# Patient Record
Sex: Female | Born: 1970 | Race: Black or African American | Hispanic: No | Marital: Single | Smoking: Never smoker
Health system: Southern US, Community
[De-identification: ages and names within clinical notes are randomized; demographics above are authoritative.]

## PROBLEM LIST (undated history)

## (undated) DIAGNOSIS — T7840XA Allergy, unspecified, initial encounter: Secondary | ICD-10-CM

## (undated) DIAGNOSIS — D649 Anemia, unspecified: Secondary | ICD-10-CM

## (undated) DIAGNOSIS — Z889 Allergy status to unspecified drugs, medicaments and biological substances status: Secondary | ICD-10-CM

## (undated) DIAGNOSIS — R079 Chest pain, unspecified: Secondary | ICD-10-CM

## (undated) DIAGNOSIS — N939 Abnormal uterine and vaginal bleeding, unspecified: Secondary | ICD-10-CM

## (undated) DIAGNOSIS — I10 Essential (primary) hypertension: Secondary | ICD-10-CM

## (undated) DIAGNOSIS — R030 Elevated blood-pressure reading, without diagnosis of hypertension: Secondary | ICD-10-CM

## (undated) HISTORY — DX: Abnormal uterine and vaginal bleeding, unspecified: N93.9

## (undated) HISTORY — PX: MULTIPLE TOOTH EXTRACTIONS: SHX2053

## (undated) HISTORY — PX: APPENDECTOMY: SHX54

## (undated) HISTORY — DX: Elevated blood-pressure reading, without diagnosis of hypertension: R03.0

## (undated) HISTORY — DX: Essential (primary) hypertension: I10

## (undated) HISTORY — PX: WISDOM TOOTH EXTRACTION: SHX21

## (undated) HISTORY — DX: Chest pain, unspecified: R07.9

## (undated) HISTORY — DX: Allergy, unspecified, initial encounter: T78.40XA

## (undated) HISTORY — PX: ABDOMINAL HYSTERECTOMY: SHX81

---

## 2000-01-23 ENCOUNTER — Encounter: Admission: RE | Admit: 2000-01-23 | Discharge: 2000-01-23 | Payer: Self-pay | Admitting: Family Medicine

## 2000-01-24 ENCOUNTER — Encounter: Admission: RE | Admit: 2000-01-24 | Discharge: 2000-01-24 | Payer: Self-pay | Admitting: Family Medicine

## 2000-06-19 HISTORY — PX: ORIF RADIUS & ULNA FRACTURES: SHX2129

## 2000-09-25 ENCOUNTER — Emergency Department (HOSPITAL_COMMUNITY): Admission: EM | Admit: 2000-09-25 | Discharge: 2000-09-25 | Payer: Self-pay | Admitting: Emergency Medicine

## 2000-10-01 ENCOUNTER — Encounter: Admission: RE | Admit: 2000-10-01 | Discharge: 2000-10-01 | Payer: Self-pay | Admitting: Sports Medicine

## 2000-10-18 ENCOUNTER — Encounter: Admission: RE | Admit: 2000-10-18 | Discharge: 2000-10-18 | Payer: Self-pay | Admitting: Family Medicine

## 2000-10-26 ENCOUNTER — Ambulatory Visit (HOSPITAL_BASED_OUTPATIENT_CLINIC_OR_DEPARTMENT_OTHER): Admission: RE | Admit: 2000-10-26 | Discharge: 2000-10-27 | Payer: Self-pay | Admitting: Orthopedic Surgery

## 2001-04-08 ENCOUNTER — Encounter: Admission: RE | Admit: 2001-04-08 | Discharge: 2001-04-08 | Payer: Self-pay | Admitting: Family Medicine

## 2001-05-22 ENCOUNTER — Ambulatory Visit (HOSPITAL_BASED_OUTPATIENT_CLINIC_OR_DEPARTMENT_OTHER): Admission: RE | Admit: 2001-05-22 | Discharge: 2001-05-22 | Payer: Self-pay | Admitting: Orthopedic Surgery

## 2001-08-08 ENCOUNTER — Encounter: Admission: RE | Admit: 2001-08-08 | Discharge: 2001-08-08 | Payer: Self-pay | Admitting: Family Medicine

## 2001-09-19 ENCOUNTER — Encounter: Admission: RE | Admit: 2001-09-19 | Discharge: 2001-09-19 | Payer: Self-pay | Admitting: Family Medicine

## 2003-05-20 ENCOUNTER — Encounter (INDEPENDENT_AMBULATORY_CARE_PROVIDER_SITE_OTHER): Payer: Self-pay | Admitting: *Deleted

## 2003-05-20 LAB — CONVERTED CEMR LAB

## 2003-06-08 ENCOUNTER — Encounter: Admission: RE | Admit: 2003-06-08 | Discharge: 2003-06-08 | Payer: Self-pay | Admitting: Family Medicine

## 2003-06-08 ENCOUNTER — Encounter (INDEPENDENT_AMBULATORY_CARE_PROVIDER_SITE_OTHER): Payer: Self-pay | Admitting: *Deleted

## 2003-06-30 ENCOUNTER — Encounter: Admission: RE | Admit: 2003-06-30 | Discharge: 2003-06-30 | Payer: Self-pay | Admitting: Family Medicine

## 2003-07-02 ENCOUNTER — Encounter: Admission: RE | Admit: 2003-07-02 | Discharge: 2003-07-02 | Payer: Self-pay | Admitting: Sports Medicine

## 2003-07-06 ENCOUNTER — Encounter: Admission: RE | Admit: 2003-07-06 | Discharge: 2003-07-06 | Payer: Self-pay | Admitting: Family Medicine

## 2003-07-14 ENCOUNTER — Encounter: Admission: RE | Admit: 2003-07-14 | Discharge: 2003-07-14 | Payer: Self-pay | Admitting: Obstetrics and Gynecology

## 2003-07-29 ENCOUNTER — Encounter: Admission: RE | Admit: 2003-07-29 | Discharge: 2003-07-29 | Payer: Self-pay | Admitting: Family Medicine

## 2003-09-01 ENCOUNTER — Other Ambulatory Visit: Admission: RE | Admit: 2003-09-01 | Discharge: 2003-09-01 | Payer: Self-pay | Admitting: Obstetrics & Gynecology

## 2003-09-01 ENCOUNTER — Encounter: Admission: RE | Admit: 2003-09-01 | Discharge: 2003-09-01 | Payer: Self-pay | Admitting: Obstetrics and Gynecology

## 2003-09-02 ENCOUNTER — Encounter (INDEPENDENT_AMBULATORY_CARE_PROVIDER_SITE_OTHER): Payer: Self-pay | Admitting: Specialist

## 2003-09-22 ENCOUNTER — Encounter: Admission: RE | Admit: 2003-09-22 | Discharge: 2003-09-22 | Payer: Self-pay | Admitting: Obstetrics and Gynecology

## 2003-12-15 ENCOUNTER — Encounter: Admission: RE | Admit: 2003-12-15 | Discharge: 2003-12-15 | Payer: Self-pay | Admitting: Obstetrics and Gynecology

## 2003-12-15 ENCOUNTER — Encounter (INDEPENDENT_AMBULATORY_CARE_PROVIDER_SITE_OTHER): Payer: Self-pay | Admitting: Specialist

## 2004-07-19 ENCOUNTER — Encounter (INDEPENDENT_AMBULATORY_CARE_PROVIDER_SITE_OTHER): Payer: Self-pay | Admitting: Specialist

## 2004-07-19 ENCOUNTER — Ambulatory Visit: Payer: Self-pay | Admitting: Obstetrics & Gynecology

## 2004-08-31 ENCOUNTER — Ambulatory Visit: Payer: Self-pay | Admitting: Family Medicine

## 2005-01-17 ENCOUNTER — Encounter (INDEPENDENT_AMBULATORY_CARE_PROVIDER_SITE_OTHER): Payer: Self-pay | Admitting: *Deleted

## 2005-01-17 ENCOUNTER — Ambulatory Visit: Payer: Self-pay | Admitting: Obstetrics & Gynecology

## 2005-05-01 ENCOUNTER — Emergency Department (HOSPITAL_COMMUNITY): Admission: EM | Admit: 2005-05-01 | Discharge: 2005-05-01 | Payer: Self-pay | Admitting: Family Medicine

## 2005-07-25 ENCOUNTER — Ambulatory Visit: Payer: Self-pay | Admitting: Family Medicine

## 2006-06-19 HISTORY — PX: TONSILLECTOMY: SUR1361

## 2006-08-16 DIAGNOSIS — E669 Obesity, unspecified: Secondary | ICD-10-CM

## 2006-08-16 DIAGNOSIS — D649 Anemia, unspecified: Secondary | ICD-10-CM

## 2006-08-16 DIAGNOSIS — J309 Allergic rhinitis, unspecified: Secondary | ICD-10-CM

## 2006-08-17 ENCOUNTER — Encounter (INDEPENDENT_AMBULATORY_CARE_PROVIDER_SITE_OTHER): Payer: Self-pay | Admitting: *Deleted

## 2006-08-17 ENCOUNTER — Ambulatory Visit: Payer: Self-pay | Admitting: Family Medicine

## 2006-08-17 ENCOUNTER — Encounter (INDEPENDENT_AMBULATORY_CARE_PROVIDER_SITE_OTHER): Payer: Self-pay | Admitting: Family Medicine

## 2006-08-17 LAB — CONVERTED CEMR LAB
ALT: 14 units/L (ref 0–35)
AST: 21 units/L (ref 0–37)
Albumin: 4.1 g/dL (ref 3.5–5.2)
CO2: 27 meq/L (ref 19–32)
Calcium: 9.2 mg/dL (ref 8.4–10.5)
Chlamydia, DNA Probe: NEGATIVE
Chloride: 102 meq/L (ref 96–112)
Cholesterol: 173 mg/dL (ref 0–200)
Creatinine, Ser: 0.68 mg/dL (ref 0.40–1.20)
GC Probe Amp, Genital: NEGATIVE
Potassium: 4 meq/L (ref 3.5–5.3)
Total CHOL/HDL Ratio: 3.5

## 2006-08-20 ENCOUNTER — Encounter (INDEPENDENT_AMBULATORY_CARE_PROVIDER_SITE_OTHER): Payer: Self-pay | Admitting: Family Medicine

## 2006-08-20 LAB — CONVERTED CEMR LAB: Pap Smear: NORMAL

## 2006-08-24 ENCOUNTER — Encounter (INDEPENDENT_AMBULATORY_CARE_PROVIDER_SITE_OTHER): Payer: Self-pay | Admitting: Family Medicine

## 2006-08-27 ENCOUNTER — Telehealth (INDEPENDENT_AMBULATORY_CARE_PROVIDER_SITE_OTHER): Payer: Self-pay | Admitting: *Deleted

## 2006-10-26 ENCOUNTER — Encounter (INDEPENDENT_AMBULATORY_CARE_PROVIDER_SITE_OTHER): Payer: Self-pay | Admitting: Specialist

## 2006-10-26 ENCOUNTER — Ambulatory Visit (HOSPITAL_COMMUNITY): Admission: RE | Admit: 2006-10-26 | Discharge: 2006-10-26 | Payer: Self-pay | Admitting: Otolaryngology

## 2007-05-29 ENCOUNTER — Ambulatory Visit: Payer: Self-pay | Admitting: Obstetrics & Gynecology

## 2007-05-29 ENCOUNTER — Ambulatory Visit: Payer: Self-pay | Admitting: Obstetrics and Gynecology

## 2007-08-23 ENCOUNTER — Encounter (INDEPENDENT_AMBULATORY_CARE_PROVIDER_SITE_OTHER): Payer: Self-pay | Admitting: Gynecology

## 2007-08-23 ENCOUNTER — Ambulatory Visit: Payer: Self-pay | Admitting: Gynecology

## 2007-08-30 ENCOUNTER — Ambulatory Visit: Payer: Self-pay | Admitting: Obstetrics & Gynecology

## 2007-11-18 ENCOUNTER — Emergency Department (HOSPITAL_COMMUNITY): Admission: EM | Admit: 2007-11-18 | Discharge: 2007-11-18 | Payer: Self-pay | Admitting: Emergency Medicine

## 2007-11-19 ENCOUNTER — Ambulatory Visit: Payer: Self-pay | Admitting: Sports Medicine

## 2007-11-19 DIAGNOSIS — Q6650 Congenital pes planus, unspecified foot: Secondary | ICD-10-CM | POA: Insufficient documentation

## 2007-11-19 DIAGNOSIS — M76829 Posterior tibial tendinitis, unspecified leg: Secondary | ICD-10-CM | POA: Insufficient documentation

## 2009-06-19 HISTORY — PX: CHOLECYSTECTOMY: SHX55

## 2009-08-05 ENCOUNTER — Ambulatory Visit: Payer: Self-pay | Admitting: Family Medicine

## 2009-08-05 DIAGNOSIS — M549 Dorsalgia, unspecified: Secondary | ICD-10-CM

## 2009-09-08 ENCOUNTER — Ambulatory Visit: Payer: Self-pay | Admitting: Family Medicine

## 2009-09-08 ENCOUNTER — Encounter: Payer: Self-pay | Admitting: Family Medicine

## 2009-09-08 DIAGNOSIS — R1012 Left upper quadrant pain: Secondary | ICD-10-CM

## 2009-09-09 ENCOUNTER — Encounter: Payer: Self-pay | Admitting: Family Medicine

## 2009-09-09 LAB — CONVERTED CEMR LAB
ALT: 11 units/L (ref 0–35)
Albumin: 4.1 g/dL (ref 3.5–5.2)
CO2: 25 meq/L (ref 19–32)
Calcium: 9.2 mg/dL (ref 8.4–10.5)
Chloride: 104 meq/L (ref 96–112)
Cholesterol: 173 mg/dL (ref 0–200)
Glucose, Bld: 106 mg/dL — ABNORMAL HIGH (ref 70–99)
Platelets: 415 10*3/uL — ABNORMAL HIGH (ref 150–400)
Potassium: 4.3 meq/L (ref 3.5–5.3)
RBC: 4.31 M/uL (ref 3.87–5.11)
Sodium: 139 meq/L (ref 135–145)
Total Bilirubin: 0.4 mg/dL (ref 0.3–1.2)
Total Protein: 7 g/dL (ref 6.0–8.3)
Triglycerides: 97 mg/dL (ref <150)
VLDL: 19 mg/dL (ref 0–40)
WBC: 4.9 10*3/uL (ref 4.0–10.5)

## 2009-09-10 LAB — CONVERTED CEMR LAB
Iron: 21 ug/dL — ABNORMAL LOW (ref 42–145)
Pap Smear: NEGATIVE
Pap Smear: NORMAL
Saturation Ratios: 5 % — ABNORMAL LOW (ref 20–55)
TIBC: 417 ug/dL (ref 250–470)

## 2009-09-30 ENCOUNTER — Ambulatory Visit: Payer: Self-pay | Admitting: Family Medicine

## 2009-09-30 ENCOUNTER — Telehealth: Payer: Self-pay | Admitting: Family Medicine

## 2009-10-20 ENCOUNTER — Encounter: Payer: Self-pay | Admitting: Family Medicine

## 2009-10-20 ENCOUNTER — Ambulatory Visit: Payer: Self-pay | Admitting: Family Medicine

## 2009-10-20 DIAGNOSIS — R7309 Other abnormal glucose: Secondary | ICD-10-CM | POA: Insufficient documentation

## 2009-10-21 LAB — CONVERTED CEMR LAB
HCT: 31.8 % — ABNORMAL LOW (ref 36.0–46.0)
MCHC: 30.5 g/dL (ref 30.0–36.0)
MCV: 69.1 fL — ABNORMAL LOW (ref 78.0–100.0)
Platelets: 290 10*3/uL (ref 150–400)
WBC: 6.7 10*3/uL (ref 4.0–10.5)

## 2009-12-13 ENCOUNTER — Emergency Department (HOSPITAL_COMMUNITY): Admission: EM | Admit: 2009-12-13 | Discharge: 2009-12-13 | Payer: Self-pay | Admitting: Emergency Medicine

## 2009-12-13 ENCOUNTER — Telehealth: Payer: Self-pay | Admitting: Family Medicine

## 2009-12-16 ENCOUNTER — Ambulatory Visit: Payer: Self-pay | Admitting: Family Medicine

## 2009-12-21 ENCOUNTER — Ambulatory Visit: Payer: Self-pay | Admitting: Family Medicine

## 2009-12-21 ENCOUNTER — Telehealth: Payer: Self-pay | Admitting: Family Medicine

## 2009-12-22 ENCOUNTER — Ambulatory Visit: Payer: Self-pay | Admitting: Family Medicine

## 2009-12-22 ENCOUNTER — Encounter: Payer: Self-pay | Admitting: Family Medicine

## 2009-12-22 LAB — CONVERTED CEMR LAB
Alkaline Phosphatase: 60 units/L (ref 39–117)
BUN: 6 mg/dL (ref 6–23)
Blood in Urine, dipstick: NEGATIVE
Creatinine, Ser: 0.72 mg/dL (ref 0.40–1.20)
Glucose, Bld: 139 mg/dL — ABNORMAL HIGH (ref 70–99)
Hemoglobin: 9.3 g/dL — ABNORMAL LOW (ref 12.0–15.0)
Ketones, urine, test strip: NEGATIVE
MCHC: 31 g/dL (ref 30.0–36.0)
MCV: 70.6 fL — ABNORMAL LOW (ref 78.0–100.0)
Nitrite: NEGATIVE
RBC: 4.25 M/uL (ref 3.87–5.11)
RDW: 15.9 % — ABNORMAL HIGH (ref 11.5–15.5)
Total Bilirubin: 0.4 mg/dL (ref 0.3–1.2)
Urobilinogen, UA: 0.2
WBC Urine, dipstick: NEGATIVE

## 2009-12-27 ENCOUNTER — Encounter: Payer: Self-pay | Admitting: Family Medicine

## 2009-12-27 ENCOUNTER — Ambulatory Visit: Payer: Self-pay | Admitting: Family Medicine

## 2009-12-27 LAB — CONVERTED CEMR LAB
Hgb A2 Quant: 4.6 % — ABNORMAL HIGH (ref 2.2–3.2)
Hgb F Quant: 0 % (ref 0.0–2.0)
Hgb S Quant: 0 % (ref 0.0–0.0)

## 2009-12-28 ENCOUNTER — Encounter: Admission: RE | Admit: 2009-12-28 | Discharge: 2009-12-28 | Payer: Self-pay | Admitting: Family Medicine

## 2009-12-28 ENCOUNTER — Telehealth: Payer: Self-pay | Admitting: Family Medicine

## 2009-12-28 DIAGNOSIS — K8020 Calculus of gallbladder without cholecystitis without obstruction: Secondary | ICD-10-CM

## 2009-12-30 ENCOUNTER — Ambulatory Visit: Payer: Self-pay | Admitting: Family Medicine

## 2009-12-30 ENCOUNTER — Telehealth: Payer: Self-pay | Admitting: *Deleted

## 2010-01-19 ENCOUNTER — Encounter: Payer: Self-pay | Admitting: Family Medicine

## 2010-02-22 ENCOUNTER — Encounter (INDEPENDENT_AMBULATORY_CARE_PROVIDER_SITE_OTHER): Payer: Self-pay | Admitting: Surgery

## 2010-02-22 ENCOUNTER — Ambulatory Visit (HOSPITAL_COMMUNITY): Admission: RE | Admit: 2010-02-22 | Discharge: 2010-02-23 | Payer: Self-pay | Admitting: Surgery

## 2010-03-02 ENCOUNTER — Ambulatory Visit: Payer: Self-pay | Admitting: Family Medicine

## 2010-03-09 ENCOUNTER — Emergency Department (HOSPITAL_COMMUNITY): Admission: EM | Admit: 2010-03-09 | Discharge: 2010-03-09 | Payer: Self-pay | Admitting: Family Medicine

## 2010-03-16 ENCOUNTER — Encounter: Payer: Self-pay | Admitting: Family Medicine

## 2010-07-21 NOTE — Assessment & Plan Note (Signed)
Summary: gallstones/[place]   Vital Signs:  Patient profile:   40 year old female Height:      64 inches Weight:      251 pounds BMI:     43.24 BSA:     2.16 Temp:     98.2 degrees F Pulse rate:   69 / minute BP sitting:   133 / 83  Vitals Entered By: Jone Baseman CMA (December 30, 2009 8:41 AM) CC: gallstone Is Patient Diabetic? No Pain Assessment Patient in pain? yes     Location: back Intensity: 2   Primary Care Provider:  Ancil Boozer  MD  CC:  gallstone.  History of Present Illness: gallstones: here to discuss results of ultrasound and plan from here.  still having intermittent pain that is primarily in LUQ.  pain seems to be worst at night.  she's had no fevers.  she's interested in learning options for her at this point for her gallstones.  she reports for pain she has been watching her diet and also using as needed motrin/ibuprofen. she did try one of her mother's hydrocodone/apap which helped more last night.   Habits & Providers  Alcohol-Tobacco-Diet     Tobacco Status: never  Current Medications (verified): 1)  Zofran Odt 4 Mg Tbdp (Ondansetron) .... Take 1 Pill By Mouth Every 8 Hours As Needed For Nausea 2)  Prochlorperazine Maleate 10 Mg Tabs (Prochlorperazine Maleate) .... Take 1 Pill By Mouth Q8hours Prn Nausea 3)  Hydrocodone-Acetaminophen 5-500 Mg Tabs (Hydrocodone-Acetaminophen) .Marland Kitchen.. 1 By Mouth Q6hr As Needed Pain  Allergies (verified): 1)  ! Sulfa  Past History:  Past medical, surgical, family and social histories (including risk factors) reviewed for relevance to current acute and chronic problems.  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 L arm surgery 2/2 break, subsequently had plate removed s/p T&A 2008  Family History: Reviewed history from 10/20/2009 and no changes required. Mother- fibroids w/hysterectomy 30s, prediabetes, ?HTN, thalassemia, ? alopecia Father - HTN, h/o gall bladder problems, diabetes Brother - healthy Daughter - healthy  Social History: Reviewed history from 09/08/2009 and no changes required. Works at [employer] engineer. Lives w/ daughter Yassmine Tamm). In monogamous relationship w/BF.  No tob/ETOH/drugs  Review of Systems       per HPI  Physical Exam  General:  vs reviewed, alert, well-developed, well-nourished, and well-hydrated.   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   she is advised to avoid large and/or fatty meals while on her trip.   she does report she has a physician going with her.  she is advised so seek treatment while away if she develops fevers.   she has been given a script for vicodin for pain while she is away knowing this is only to hold her over until surgery.  surgical referral attached to phone note from this AM Orders: [facility]- Est Level  3 (65784)  Complete Medication List: 1)  Zofran Odt 4 Mg Tbdp (Ondansetron) .... Take 1 pill by mouth every 8 hours as needed for nausea 2)  Prochlorperazine Maleate 10 Mg Tabs (Prochlorperazine maleate) .... Take 1 pill by mouth q8hours prn nausea 3)  Hydrocodone-acetaminophen 5-500 Mg Tabs (Hydrocodone-acetaminophen) .Marland Kitchen.. 1 by mouth  q6hr as needed pain  Patient Instructions: 1)  We will schedule a surgery appt hopefully the week you get back to discuss surgery for your gall bladder.   2)  Avoid large meals and fatty foods.  3)  If you get a fever while on your trip you should be seen. Prescriptions: HYDROCODONE-ACETAMINOPHEN 5-500 MG TABS (HYDROCODONE-ACETAMINOPHEN) 1 by mouth q6hr as needed pain  #45 x 0   Entered and Authorized by:   Ancil Boozer  MD   Signed by:   Ancil Boozer  MD on 12/30/2009   Method used:   Handwritten   RxID:   [id number]

## 2010-07-21 NOTE — Assessment & Plan Note (Signed)
Summary: r ankle swollen/[place]   Vital Signs:  Patient profile:   40 year old female Weight:      255.8 pounds Temp:     98.4 degrees F oral Pulse rate:   82 / minute Pulse rhythm:   regular BP sitting:   117 / 84  (left arm) Cuff size:   regular  Vitals Entered By: Loralee Pacas CMA (September 30, 2009 9:46 AM) CC: right ankle and right wrist Comments right side tingling for an hour   Primary Care Provider:  Ancil Boozer  MD  CC:  right ankle and right wrist.  History of Present Illness: 1.  right ankle--woke up this morning and noticed that her ankle was slightly swollen.  not red or painful.  no injury or twisting or overuse.  a short time later, her toes started to tingle.  this radiated up into her calf.  however this sensation has since subsided.  does not feel that ankle is unstable.  no popping.    2.  right wrist--for the past week, wrist slightly tingly/numb when she wakes up in the morning.  goes away later in the day.  works as a Teaching laboratory technician.  does not do a lot of typing or other repetitive activity with her hands.  tingling radiates about 2/3 up her forearm.    Allergies: 1)  ! Sulfa  Review of Systems General:  Denies fever and weakness. MS:  no muscles weakness or loss of rom. Neuro:  Complains of tingling; denies brief paralysis, difficulty with concentration, disturbances in coordination, headaches, inability to speak, memory loss, poor balance, visual disturbances, and weakness.  Physical Exam  General:  Well-developed,well-nourished,in no acute distress; alert,appropriate and cooperative throughout examination.  VS noted.  obese Msk:  right ankle:  I cannot appreciate any swelling.  no erythema or skin changes.  5/5 strength.  normal ROM, no joint warmth, no joint deformities, and no joint instability.  she does have mild ttp over anterior tendons.    right wrist:  no swelling or erythema or skin changes.  no joint deformity.  full rom.  5/5 strength.    +phalens and tinels after about 30 sec Pulses:  1+ dp pulses 2+ radial pulses Neurologic:  alert & oriented X3 and major cranial nerves II-XII intact.  alert & oriented X3, cranial nerves II-XII intact, strength normal in all extremities, sensation intact to light touch, gait normal, finger-to-nose normal, and Romberg negative.     Impression & Recommendations:  Problem # 1:  ? of ANKLE SPRAIN, RIGHT (ICD-845.00) Assessment New  very mild ankle sprain vs tendinits.  Recommend rice.  see pt instruction.  exam quite unimpressive  Orders: [facility]- Est  Level 4 (16109)  Problem # 2:  WRIST PAIN, RIGHT (UEA-540.98) Assessment: New  think probably carpal tunnel.  gave cock-up wrist splint.  only other concern is could this be early presentation of something more ominous, such as MS.  this seems less likely.  but will have her f/u in 3 weeks to ensure she is getting better.    Orders: Wrist Splint Cock Up [phone]) Brace Wrist [phone]) EMR Misc Charge Code (EMRMisc) [facility]- Est  Level 4 (62130)  Patient Instructions: 1)  It was nice to see you today. 2)  I think you may have carpal tunnel syndrome in your right wrist.  Wear the wrist splint that we gave you at night and any time you are using your hands a lot. 3)  For your ankle, you  may have a mild sprain.  Try resting and elevating your ankle as much as possible.  Try wearing a compression sleeve.  Putting ice on it will help too. 4)  Please schedule a follow-up appointment in 3 weeks with myself or Dr. Sandi Mealy to make sure you are gettting better.  5)  If you have any one-sided weakness, trouble speaking call 911.

## 2010-07-21 NOTE — Assessment & Plan Note (Signed)
Summary: cpp/eo   Vital Signs:  Patient profile:   40 year old female Height:      64 inches Weight:      253.3 pounds BMI:     43.64 Temp:     98.2 degrees F oral Pulse rate:   86 / minute BP sitting:   135 / 81  (left arm) Cuff size:   regular  Vitals Entered By: Garen Grams LPN (September 08, 2009 8:55 AM) CC: CPE Is Patient Diabetic? No Pain Assessment Patient in pain? yes     Location: lower back   Primary Care Provider:  Ancil Boozer  MD  CC:  CPE.  History of Present Illness: here for CPE/pap.  wants to also discuss recent back/abdominal pain that she saw Dr Wallene Huh for.  At his recommendation she has been keeping a food diary to see if she can correlate her LUQ abdominal and back pain to her diet.  She never tried the beano as suggested but did try one called "digest more."  this did help her symptoms.  She has also noticed a correlation of worsening with eating too late in the evening, eating dairy or eating "heavy foods."  when avoiding this she does not have the discomfort.  She has also tried to get back into exercising she she thinks may be helping.  She has noticed some continued bloating and pain if she eats these foods.  denies any changes in bowel habits, no vomiting or nausea, no blood in stool.  she denies fevers.    Habits & Providers  Alcohol-Tobacco-Diet     Alcohol drinks/day: 0     Tobacco Status: never  Exercise-Depression-Behavior     Does Patient Exercise: yes     Exercise Counseling: to keep it up     STD Risk: never     Drug Use: never  Current Medications (verified): 1)  None  Allergies (verified): 1)  ! Sulfa  Past History:  Past medical, surgical, family and social histories (including risk factors) reviewed, and no changes noted (except as noted below).  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 Mother- fibroids w/hysterectomy 30s, prediabetes, ?HTN, thalassemia, ? alopecia Father - HTN, h/o gall bladder problems, prediabetes Brother - healthy Daughter - healthy  Social History: Reviewed history from 08/16/2006 and no changes required. Works at [employer] engineer. Lives w/ daughter Tezra Mahr). In monogamous relationship w/BF.  No tob/ETOH/drugsDrug Use:  never STD Risk:  never Does Patient Exercise:  yes  Review of Systems       No headache,  no sore throat, cough, shortness of breath, chest pain, abdominal pain, change in bowel habits, diarrhea, constipation, melena, BRBPR, dysuria, urinary frequency, joint aches or pains, rash.   + for nasal congestion since start of spring.  otherwise per HPI   Physical Exam  General:  Well-developed,well-nourished,in no acute distress; alert,appropriate and cooperative throughout examination.  VS noted.  obese Head:  Normocephalic and atraumatic without obvious abnormalities. No apparent alopecia or balding. Eyes:  No corneal or conjunctival inflammation noted. EOMI. Perrla.  Ears:  External ear exam shows no significant lesions or deformities.  Otoscopic examination reveals clear canals, tympanic membranes are intact bilaterally without bulging, retraction, inflammation or discharge. Hearing is grossly normal bilaterally. Nose:  External nasal examination shows no deformity or inflammation. Nasal mucosa are pink and moist without lesions or exudates. Mouth:  Oral mucosa and oropharynx without lesions or exudates. poor dentition with multiple missing teeth and many others with caries or previous dental work Neck:  No deformities,  masses, or tenderness noted. Breasts:  No mass, nodules, thickening, tenderness, bulging, retraction, inflamation, nipple discharge or skin changes noted.  large. Lungs:  Normal respiratory effort, chest expands symmetrically. Lungs are clear to auscultation, no crackles or wheezes. Heart:  Normal rate and regular rhythm. S1 and S2 normal without gallop, murmur, click, rub or other extra sounds. Abdomen:  Bowel sounds positive,abdomen soft and non-tender without masses, organomegaly or hernias noted. Genitalia:  Normal introitus for age, no external lesions, no vaginal discharge, mucosa pink and moist, no vaginal or cervical lesions, no vaginal atrophy, no friaility or hemorrhage, normal uterus size and position, no adnexal masses or tenderness.  some small subcutanous cysts noted on labia bilaterally.   pap performed Msk:  No deformity or scoliosis noted of thoracic or lumbar spine.   Pulses:  2+in all extremities Extremities:  No clubbing, cyanosis, edema, or deformity noted with normal full range of motion of all joints.   Neurologic:  alert & oriented X3 and gait normal.   Skin:  Intact without suspicious lesions or rashes Psych:  Cognition and judgment appear intact. Alert and cooperative with normal attention span and concentration. No apparent delusions, illusions, hallucinations   Impression & Recommendations:  Problem # 1:  ROUTINE GYNECOLOGICAL EXAMINATION (ICD-V72.31) Assessment Unchanged overall doing well.  encouraged continued exercise, watching diet. due for FLP drawn today.  BP is borderline so will need watching over time. pap performed.  next pap if normal due in 1 year.  start mammos at age 62 per screening guidelines.  Orders: [facility] - Est  18-39 yrs (04540)  Problem # 2:  LUQ PAIN (ICD-789.02) Assessment: Improved see pt instructions.  appears likely related to food intolerances.   Orders: Comp Met-[facility] [phone]) Lipase-[facility] [phone])  Other Orders: Lipid-[facility]  ([id number]) CBC-[facility] (84132) Pap Smear-[facility] ([id number])  Patient Instructions: 1)  I'll send you a letter with the results of your lab work and pap smear unless something unusual comes up. 2)  Try to lactaid or probiotics for your bowels to help get them back in order.  3)  Continue to watch what you eat and to exercise regularly - this is very important for your health and keeping things like high blood pressure and diabetes away. 4)  It was nice to meet you today.  Please follow up if your back pain persists or gets worse.   Appended Document: Orders Update    Clinical Lists Changes  Orders: Added new Test order of Ferritin-[facility] [id number]) - Signed Added new Test order of Iron Binding Cap (TIBC)-[facility] ([id number]) - Signed Added new Test order of Iron -[facility] [phone]) - Signed

## 2010-07-21 NOTE — Progress Notes (Signed)
Summary: triage  Phone Note Call from Patient Call back at [phone]   Caller: Patient Summary of Call: started having GI problems again and needs to talk to nurse Initial call taken by: De Nurse,  December 21, 2009 1:53 PM  Follow-up for Phone Call        states she is not better from last visit. will see pcp at 4:15 today Follow-up by: Golden Circle RN,  December 21, 2009 1:54 PM

## 2010-07-21 NOTE — Progress Notes (Signed)
Summary: Results  Phone Note Call from Patient Call back at [phone]   Summary of Call: pt would like MD to call her cell phone when ultrasound results come in Initial call taken by: Knox Royalty,  December 28, 2009 2:22 PM  Follow-up for Phone Call        pt calling again, said she was told if anything changed, she is in a lot of pain. Follow-up by: Knox Royalty,  December 28, 2009 3:28 PM  Additional Follow-up for Phone Call Additional follow up Details #1::        told her it did show gallstones. she wants to know what the next step is. told her I will send msg to her pcp & the md who saw her & I will call her as soon as I know what next Additional Follow-up by: Golden Circle RN,  December 28, 2009 3:38 PM  New Problems: CHOLELITHIASIS, SYMPTOMATIC (ICD-574.20)   Additional Follow-up for Phone Call Additional follow up Details #2::    pt calling back, wants to know if she can eat or drink anything? Follow-up by: Knox Royalty,  December 29, 2009 11:51 AM  Additional Follow-up for Phone Call Additional follow up Details #3:: Details for Additional Follow-up Action Taken: told her ok to eat & drink. avoid anything fatty.  gave examples. appt made for next day to discuss next steps with md Additional Follow-up by: Golden Circle RN,  December 29, 2009 11:55 AM  New Problems: CHOLELITHIASIS, SYMPTOMATIC (ICD-574.20)  agree with avoiding anything fatty at this time.  we need to set up surgical consult.  stones cannot be "blasted out" as with kidney stones as they #1) will not pass when blasted out and tend to have more problems unlike kidney stones and #2) the gall bladder can make more stones in the future.  So, she needs a surgical consult to talk about removal of the gall bladder.  will put in consult note and order.  please inform patient. Ancil Boozer  MD  December 30, 2009 8:31 AM  patient came in for appt this am. Ancil Boozer  MD  December 30, 2009 8:53 AM

## 2010-07-21 NOTE — Assessment & Plan Note (Signed)
Summary: GI upset/   Vital Signs:  Patient profile:   40 year old female Height:      64 inches Weight:      252 pounds BMI:     43.41 Temp:     98.4 degrees F oral Pulse rate:   93 / minute BP sitting:   128 / 80  (left arm) Cuff size:   regular  Vitals Entered By: Jone Baseman CMA (December 21, 2009 4:49 PM) CC: still having stomach issues Is Patient Diabetic? No Pain Assessment Patient in pain? yes     Location: stomach   Primary Care Provider:  Ancil Boozer  MD  CC:  still having stomach issues.  History of Present Illness: 40 yo female here for f/u GI issues. 1. Has had GI issues off and on since Feb 2011.  Had been improving up until last visit 6/30. Has been taking Omeprazole daily since last visit. Complains of nausea and abd pain.  "Loose" stools for the past 5 days, but not watery; BM's 3-4 times daily.   Decreased appetite in the past 7 days along with decreased by mouth intake. Is tolerating fluids. No fevers, no dysuria, no chills, no sweats, no vomitting.  No blood in stool or urine (started period today) Pain located in LUQ and is associated with vauge back discomfort. Pain is not consistent nor reproducable; comes and goes throut the day, described as "achey and dull".  Not associated with by mouth intake.  Not relieved by anything.    Menses started today.   Habits & Providers  Alcohol-Tobacco-Diet     Tobacco Status: never  Current Problems (verified): 1)  Other Abnormal Glucose  (ICD-790.29) 2)  Luq Pain  (ICD-789.02) 3)  Back Pain  (ICD-724.5) 4)  Tibialis Tendinitis  (ICD-726.72) 5)  Congenital Pes Planus  (ICD-754.61) 6)  Rhinitis, Allergic  (ICD-477.9) 7)  Obesity, Nos  (ICD-278.00) 8)  Anemia, Other, Unspecified  (ICD-285.9)  Current Medications (verified): 1)  Iron 325 (65 Fe) Mg Tabs (Ferrous Sulfate) .... Otc Tab Once Daily 2)  Omeprazole 40 Mg Cpdr (Omeprazole) .Marland Kitchen.. 1 By Mouth Two Times A Day For Stomach Problems 3)  Zofran Odt 4  Mg Tbdp (Ondansetron) .... Take 1 Pill By Mouth Every 8 Hours As Needed For Nausea 4)  Prochlorperazine Maleate 10 Mg Tabs (Prochlorperazine Maleate) .... Take 1 Pill By Mouth Q8hours Prn Nausea  Allergies (verified): 1)  ! 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 alopecia Father - HTN, h/o gall bladder problems, diabetes Brother - healthy Daughter - healthy  Social History: Last updated: 09/08/2009 Works at [employer], advertising. Lives w/ daughter Insiya Oshea). In monogamous relationship w/BF.  No tob/ETOH/drugs  Risk Factors: Alcohol Use: 0 (09/08/2009) Exercise: yes (09/08/2009)  Risk Factors: Smoking Status: never (12/21/2009)  Review of Systems       see hpi  Physical Exam  General:  vs reviewed, alert, well-developed, well-nourished, and well-hydrated.   Lungs:  Normal respiratory effort, chest expands symmetrically. Lungs are clear to auscultation, no crackles or wheezes. Heart:  Normal rate and regular rhythm. S1 and S2 normal without gallop, murmur, click, rub or other extra sounds. Abdomen:  soft, non-tender, normal bowel sounds, no distention, no masses, no guarding, no rigidity, no rebound tenderness, no hepatomegaly, and no splenomegaly.   No CVA tenderness Extremities:  No clubbing, cyanosis, edema, or deformity noted with normal full range of motion of all joints.   Skin:  turgor normal, color normal,  and no rashes.     Impression & Recommendations:  Problem # 1:  LUQ PAIN (ICD-789.02) Assessment Unchanged unclear etiology - will get cmp to monitor LE and check kidney function. ? kidney stone? Period started today, so blood in ua unreilable. Will get cathed ua and culture.  Splenomegaly unlikely; exam inconsistent with gallbladder findings.  If labs unremarkable and pain persists, could consider abdominal ultrasound vs CT scan  See pt instructions  Orders: [facility]- Est Level  3 ([id number])Future Orders: CBC-[facility] (16109) ... 12/22/2009 Comp Met-[facility] ([id number]) ... 12/22/2009  Complete Medication List: 1)  Iron 325 (65 Fe) Mg Tabs (Ferrous sulfate) .... Otc tab once daily 2)  Omeprazole 40 Mg Cpdr (Omeprazole) .Marland Kitchen.. 1 by mouth two times a day for stomach problems 3)  Zofran Odt 4 Mg Tbdp (Ondansetron) .... Take 1 pill by mouth every 8 hours as needed for nausea 4)  Prochlorperazine Maleate 10 Mg Tabs (Prochlorperazine maleate) .... Take 1 pill by mouth q8hours prn nausea  Other Orders: Future Orders: Urine Culture-[facility] ([id number]) ... 12/22/2009 Urinalysis-[facility] (00000) ... 12/22/2009  Patient Instructions: 1)  Please come to the clinic tomorrow and get lab work and a urine culture 2)  I have sent both Zofran and Prochlorperazine to [facility] for your nausea.  Please only take 1 medicine every 8 hours for nausea (do NOT take both at the same time) 3)  Keep your appt with Dr. Sandi Mealy 4)  Make sure your drink plenty of fluids Prescriptions: PROCHLORPERAZINE MALEATE 10 MG TABS (PROCHLORPERAZINE MALEATE) take 1 pill by mouth Q8hours PRn nausea  #30 x 0   Entered and Authorized by:   Alvia Grove DO   Signed by:   Alvia Grove DO on 12/21/2009   Method used:   Electronically to        [id number] (retail)       [address] [place], [place]  [address]       Ph: [phone]       Fax: [phone]   RxID:   [id number] ZOFRAN ODT 4 MG  TBDP (ONDANSETRON) take 1 pill by mouth every 8 hours as needed for nausea  #30 x 1   Entered and Authorized by:   Alvia Grove DO   Signed by:   Alvia Grove DO on 12/21/2009   Method used:   Electronically to        [id number] (retail)       [address] [place], [place]  [address]       Ph: [phone]       Fax: [phone]   RxID:   [id number]

## 2010-07-21 NOTE — Progress Notes (Signed)
Summary: Referral request  Phone Note Call from Patient Call back at Work Phone [phone]   Summary of Call: Pt calling to find out if she can be referred to Dr. Darnell Level for her [facility] bladder removal? Initial call taken by: Knox Royalty,  December 30, 2009 10:45 AM  Follow-up for Phone Call        Appt scheduled.  See order Follow-up by: Jone Baseman CMA,  December 30, 2009 1:54 PM

## 2010-07-21 NOTE — Assessment & Plan Note (Signed)
Summary: f/u/kh   Vital Signs:  Patient profile:   40 year old female Height:      64 inches Weight:      253 pounds BMI:     43.58 BSA:     2.16 Temp:     98.4 degrees F Pulse rate:   74 / minute BP sitting:   115 / 84  Vitals Entered By: Jone Baseman CMA (Oct 20, 2009 9:04 AM) CC: f/u bloodwork, f/u ankle and wrist Is Patient Diabetic? No Pain Assessment Patient in pain? no        Primary Care Provider:  Ancil Boozer  MD  CC:  f/u bloodwork and f/u ankle and wrist.  History of Present Illness: R ankle: improved.  still swells occasionally but no pain.  wearing brace as needed.   R wrist: cock up splint helps when she remembers to wear it.  numbness resolves with use at night.    bloodwork: reviewed with patient today showing profound anemia.  she states this is pretty typical for her.  she has noticed since starting otc iron she has had more energy.  she reconfirms that she has heavy menses that she thinks is contributory. also that her mother has thalassemia.  bloodwork also showed borderline sugar and we discussed this and getting an a1c.  patient reports her father was just dx with dm2.  finally discussed her lipids and that they look very good overall.    Habits & Providers  Alcohol-Tobacco-Diet     Tobacco Status: never  Current Medications (verified): 1)  Iron 325 (65 Fe) Mg Tabs (Ferrous Sulfate) .... Otc Tab Once Daily  Allergies (verified): 1)  ! Sulfa  Past History:  PMH reviewed for relevance  Family History: Mother- fibroids w/hysterectomy 30s, prediabetes, ?HTN, thalassemia, ? alopecia Father - HTN, h/o gall bladder problems, diabetes Brother - healthy Daughter - healthy  Review of Systems       per HPI  Physical Exam  General:  Well-developed,well-nourished,in no acute distress; alert,appropriate and cooperative throughout examination.  VS noted.  obese Msk:  right ankle: minimal at most to no swelling appreciated.  no erythema or  skin changes.  5/5 strength.  normal ROM, no joint warmth, no joint deformities, and no joint instability.  she does have mild ttp over anterior tendons.    right wrist:  no swelling or erythema or skin    Impression & Recommendations:  Problem # 1:  WRIST PAIN, RIGHT (ICD-719.43) Assessment Improved  continue splint nightly for now.   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  if not or not as much as expected consider hemaglobin electrophoresis given family hx.  Her updated medication list for this problem includes:    Iron 325 (65 Fe) Mg Tabs (Ferrous sulfate) ..... Otc tab once daily  Orders: CBC-[facility] (09811) Retic-[facility] ([address]1478-29562) TSH-[facility] (13086-57846) [facility]- Est Level  3 (96295)  Problem # 4:  OTHER ABNORMAL GLUCOSE (ICD-790.29) Assessment: New check a1c given fam hx as well  Orders: A1C-[facility] (28413) [facility]- Est Level  3 (24401)  Complete Medication List: 1)  Iron 325 (65 Fe) Mg Tabs (Ferrous sulfate) .... Otc tab once daily  Patient Instructions: 1)  Lets plan on regrouping in about 3 months to see how  things are going. 2)  If there is anything unusual or worrisome on your bloodwork we'll let you know right away.  3)  It was nice to see you today. - continue to use the wrist and ankle braces.   Laboratory Results   Blood Tests   Date/Time Received: Oct 20, 2009 9:40 AM  Date/Time Reported: Oct 20, 2009 10:25 AM   HGBA1C: 6.0%   (Normal Range: Non-Diabetic - 3-6%   Control Diabetic - 6-8%)  Comments: ...............test performed by......Marland KitchenBonnie A. [place], MLS (ASCP)cm

## 2010-07-21 NOTE — Assessment & Plan Note (Signed)
Summary: back pain,df   Vital Signs:  Patient profile:   40 year old female Weight:      248.4 pounds Temp:     98.7 degrees F oral Pulse rate:   91 / minute BP sitting:   143 / 90  (left arm) Cuff size:   regular  Vitals Entered By: Garen Grams LPN (August 05, 2009 10:14 AM) CC: back discomfort x 3 weeks Is Patient Diabetic? No Pain Assessment Patient in pain? yes     Location: back   CC:  back discomfort x 3 weeks.  History of Present Illness: 1) Back discomfort: x 3 weeks. Intermittent. Mild. Located left side of back "right under rib cage". Feels "dull", like "gas bubble". Worse after drinking sodas, eating fast food. No other triggers.  Not affected by position or movement. Better after drinking waer, burping, passing gas. Not relieved by Tylenol. Has been passing gas more frequently. Denies dysuria, fever, chills, constipation, diarrhea, trauma, vaginal discharge, chest pain, palpitations, dyspnea, numbness, tingling, weakness, radiation of discomfort. LMP was 07/30/09. Occurs more in the morning.   Habits & Providers  Alcohol-Tobacco-Diet     Tobacco Status: never  Current Medications (verified): 1)  None  Allergies (verified): No Known Drug Allergies  Social History: Smoking Status:  never  Physical Exam  General:  obese, NAD  Lungs:  CTAB w/o wheeze or crackles  Heart:  RRR no murmurs  Abdomen:  obese, non tender, +BS, no rebound or guarding  Msk:  non tender to palpation along entire back.  Full ROM at thoracic and lumbar spine w/o pain. -ve SLR bilaterally     Impression & Recommendations:  Problem # 1:  BACK PAIN (ICD-724.5) Assessment New  Mild. Appears to be referred pain from GI process - likely increased gas due to poor eating choices.  Advised to keep track of which foods trigger symptoms. Advised to avoid sodas and fast foods. Advised to eat more fruits and vegetables. Advised to take Beano for increased flatulence. Follow up with PCP.    Orders: [facility]- Est Level  3 (62952)

## 2010-07-21 NOTE — Consult Note (Signed)
Summary: [facility]   Imported By: Clydell Hakim 02/09/2010 13:45:46  _____________________________________________________________________  External Attachment:    Type:   Image     Comment:   External Document

## 2010-07-21 NOTE — Assessment & Plan Note (Signed)
Summary: F/U EO   Vital Signs:  Patient profile:   40 year old female Height:      64 inches Weight:      254.1 pounds BMI:     43.77 Temp:     98.1 degrees F Pulse rate:   78 / minute BP sitting:   133 / 87  (left arm) Cuff size:   regular  Vitals Entered By: Garen Grams LPN (December 27, 2009 9:23 AM) CC: f/u stomach pain Is Patient Diabetic? No Pain Assessment Patient in pain? yes     Location: stomach Intensity: 6   Primary Care Provider:  Ancil Boozer  MD  CC:  f/u stomach pain.  History of Present Illness: stomach pain:  reports nausea has resolved.  only needed one dose of prochlorperazine (this made her feel hungover and "goofy" for an entire day though) but since then doing well in terms of nausea.  pain however still persists.  worse with movement.  she states she feels "vibrations" in the area that hurt.  she also reports that when she eats the pain is worse and occasionally she will note it radiates to the right when eating.  no specific foods have been identified at this time as worse than others. her BMs have been regular - no constipation, no diarrhea, no blood though she states "she can tell she takes iron".  she stopped omeprazole as she saw no improvement.  she is no longer using the digestive enzymes.  she denies fevers.  she tried the [employer] for a few days but actually felt worse.  she hasn't vomited.    Habits & Providers  Alcohol-Tobacco-Diet     Tobacco Status: never  Current Medications (verified): 1)  Zofran Odt 4 Mg Tbdp (Ondansetron) .... Take 1 Pill By Mouth Every 8 Hours As Needed For Nausea 2)  Prochlorperazine Maleate 10 Mg Tabs (Prochlorperazine Maleate) .... Take 1 Pill By Mouth Q8hours Prn Nausea  Allergies (verified): 1)  ! Sulfa  Past History:  Past medical, surgical, family and social histories (including risk factors) reviewed for relevance to current acute and chronic problems.  Past Medical History: Reviewed history from  09/08/2009 and no changes required. 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 L arm surgery 2/2 break, subsequently had plate removed s/p T&A 2008  Family History: Reviewed history from 10/20/2009 and no changes required. Mother- fibroids w/hysterectomy 30s, prediabetes, ?HTN, thalassemia, ? alopecia Father - HTN, h/o gall bladder problems, diabetes Brother - healthy Daughter - healthy  Social History: Reviewed history from 09/08/2009 and no changes required. Works at [employer] engineer. Lives w/ daughter Samanthia Howland). In monogamous relationship w/BF.  No tob/ETOH/drugs  Review of Systems       per HPI  Physical Exam  General:  vs reviewed, alert, well-developed, well-nourished, and well-hydrated.   VS noted = WNL Abdomen:  soft,  normal bowel sounds, no distention, no masses, no guarding, no rigidity, no rebound tenderness, no hepatomegaly, and no splenomegaly.  exam limited due to habitus.  tender over poster ribs on left and under ribs on left around to center but not quite to epigastric region.  mildly tender RUQ as well.  otherwise nontender  No CVA tenderness   Impression &  Recommendations:  Problem # 1:  LUQ PAIN (ICD-789.02) Assessment Deteriorated ? if this could be an abnormal presentation of gallstones - she is at risk = obese, ovulation, age.  will get u/s to eval.  at this point if no cause found on u/s would refer to GI for more workup - ? IBS but would need complete rule out which GI could do.   advised her to take her nausea medicines and food with her to the [place] for her mission trip. she agrees.    Orders: Ultrasound (Ultrasound) [facility]- Est  Level 4 (16109)  Problem # 2:  ANEMIA, OTHER, UNSPECIFIED (ICD-285.9) Assessment: Unchanged given non responsive to iron therapy and given family history of thalassemia will get hemaglobin electrophoresis.  stop iron as this may be causing some GI upset as well  The following medications were removed from the medication list:    Iron 325 (65 Fe) Mg Tabs (Ferrous sulfate) ..... Otc tab once daily  Orders: Miscellaneous Lab Charge-[facility] ([address]) [facility]- Est  Level 4 (09811)  Complete Medication List: 1)  Zofran Odt 4 Mg Tbdp (Ondansetron) .... Take 1 pill by mouth every 8 hours as needed for nausea 2)  Prochlorperazine Maleate 10 Mg Tabs (Prochlorperazine maleate) .... Take 1 pill by mouth q8hours prn nausea  Patient Instructions: 1)  Please follow up after your mission trip to see how the stomach is doing. 2)  I will call you once I have the ultrasound report.   3)  Your hemaglobin electrophoresis will take a few weeks to come back.

## 2010-07-21 NOTE — Progress Notes (Signed)
Summary: triage  Phone Note Call from Patient Call back at [phone]   Summary of Call: Right side feels funny.  Toes tingling, hand feels numb. Initial call taken by: Clydell Hakim,  September 30, 2009 9:18 AM  Follow-up for Phone Call        noticed this am. R ankle is swollen. toes are tingling & R hand is tight up to elbow. advised to come now. she agreed Follow-up by: Golden Circle RN,  September 30, 2009 9:19 AM

## 2010-07-21 NOTE — Consult Note (Signed)
Summary: [facility]   Imported By: De Nurse 04/06/2010 14:54:43  _____________________________________________________________________  External Attachment:    Type:   Image     Comment:   External Document

## 2010-07-21 NOTE — Assessment & Plan Note (Signed)
Summary: F/U GI ISSUES/KH   Vital Signs:  Patient profile:   40 year old female Height:      64 inches Weight:      255.4 pounds BMI:     44.00 Temp:     98.3 degrees F oral Pulse rate:   75 / minute BP sitting:   132 / 85  (left arm) Cuff size:   regular  Vitals Entered By: Garen Grams LPN (December 16, 2009 2:43 PM) CC: f/u stomach pain Is Patient Diabetic? No Pain Assessment Patient in pain? no        Primary Care Provider:  Ancil Boozer  MD  CC:  f/u stomach pain.  History of Present Illness: stomach pain: off and on since 07/2009.  had improved as of recently until probably 1.5 weeks ago when she was on a cruise.  she states she was eating her normal diet and working hard to keep to that on the cruise.  despite this she had some severe nausea and some diarrhea while on the cruise.  in addition she had some left sided abdominal pain radiating around to her back.  she had worked out some so she thought it might be muscle strain but it never improved.  she hasn't tried any meds other than her digestive enzyme which maybe helped a little.  since her cruise she has been seen at urgent care who dx msk pain.  since then it has improved a slight bit by eating much less and bland foods.  the pain remains on her left side.  she's had no fevers with it.  the diarrhea has resolved as of several days ago.  Habits & Providers  Alcohol-Tobacco-Diet     Tobacco Status: never  Current Medications (verified): 1)  Iron 325 (65 Fe) Mg Tabs (Ferrous Sulfate) .... Otc Tab Once Daily 2)  Omeprazole 40 Mg Cpdr (Omeprazole) .Marland Kitchen.. 1 By Mouth Two Times A Day For Stomach Problems  Allergies (verified): 1)  ! Sulfa  Past History:  Past medical, surgical, family and social histories (including risk factors) reviewed for relevance to current acute and chronic problems.  Past Medical History: Reviewed history from 09/08/2009 and no changes required. 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 L arm surgery 2/2 break, subsequently had plate removed s/p T&A 2008  Family History: Reviewed history from 10/20/2009 and no changes required. Mother- fibroids w/hysterectomy 30s, prediabetes, ?HTN, thalassemia, ? alopecia Father - HTN, h/o gall bladder problems, diabetes Brother - healthy Daughter - healthy  Social History: Reviewed history from 09/08/2009 and no changes required. 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 In monogamous relationship w/BF.  No tob/ETOH/drugs  Review of Systems       per HPI  Physical Exam  General:  Well-developed,well-nourished,in no acute distress; alert,appropriate and cooperative throughout examination.  VS noted.  obese Abdomen:  + BS but hypoactive.  soft.  nondistended.  no HSM appreciated but examination limited by habitus.  mildly TTP LUQ and around to her back and ribs.  she has on a very tight fitting bra. no swelling noted.  no rash or skin findings in the area of pain.    Impression & Recommendations:  Problem # 1:  LUQ PAIN (  ICD-789.02) Assessment Deteriorated  still unclear etiology - had improved somewhat with changes in digestive health so i suspect this is likely a GI problem - ? IBS.   could also be MSK pain with ribs too however.  i don't suspect splenomegaly as problem but if continues to persist could consider abdominal ultrasound.   for now, add trial of PPI, f/u before planned mission trip in 3 weeks.  if still present could consider the above or CXR or even GI consult.   Orders: [facility]- Est Level  3 (16109)  Complete Medication List: 1)  Iron 325 (65 Fe) Mg Tabs  (Ferrous sulfate) .... Otc tab once daily 2)  Omeprazole 40 Mg Cpdr (Omeprazole) .Marland Kitchen.. 1 by mouth two times a day for stomach problems  Patient Instructions: 1)  Please follow up just before your trip to [place] to see how your stomach is doing. 2)  Try the medicine for your stomach every day until our follow up. 3)  If things worsen please let me know.  Prescriptions: OMEPRAZOLE 40 MG CPDR (OMEPRAZOLE) 1 by mouth two times a day for stomach problems  #60 x 0   Entered and Authorized by:   Ancil Boozer  MD   Signed by:   Ancil Boozer  MD on 12/16/2009   Method used:   Electronically to        [facility] [id number] (retail)       [address] [place], [place]  [address]       Ph: [phone]       Fax: [phone]   RxID:   [id number]

## 2010-07-21 NOTE — Assessment & Plan Note (Signed)
Summary: follow up gallstones,df   Vital Signs:  Patient profile:   40 year old female Height:      64 inches Weight:      239.2 pounds BMI:     41.21 Temp:     99.3 degrees F oral Pulse rate:   118 / minute BP sitting:   129 / 86  (left arm) Cuff size:   regular  Vitals Entered By: Garen Grams LPN (March 02, 2010 3:22 PM) CC: abdominal bloating Is Patient Diabetic? No Pain Assessment Patient in pain? yes     Location: left side Intensity: 2   Primary Care Provider:  Alvia Grove DO  CC:  abdominal bloating.  History of Present Illness: 1) Follow up of symptomatic cholelithiasis: Laparaoscopic cholecystectomy on 02/22/10 for symptomatic cholelithiasis. Reports some mild upper left quadrant bloating and fullness since the surgery, which is worse with sodas.  Reports nausea with water only (reports that this was a problem prior to surgery as well), though does fine with flavored water, juice, sodas. Last bowel movement was yesterday. Previous bowel movement was 3 days prior. Mild itching noted at surgical sites.    ROS: Denies emesis, diarrhea, melena, hematochezia, abdominal pain, drainage from surgical wounds, fever, chills, dyspnea, chest pain, generalized weakness, decreased appetite.   Habits & Providers  Alcohol-Tobacco-Diet     Alcohol drinks/day: 0     Tobacco Status: never  Current Medications (verified): 1)  Hydrocodone-Acetaminophen 5-500 Mg Tabs (Hydrocodone-Acetaminophen) .Marland Kitchen.. 1 By Mouth Q6hr As Needed Pain  Allergies (verified): 1)  ! Sulfa  Physical Exam  General:  obese, vitals reviewed, NAD, pleasant  Mouth:  moist membranes  Lungs:  Normal respiratory effort, chest expands symmetrically. Lungs are clear to auscultation, no crackles or wheezes. Heart:  Normal rate and regular rhythm. S1 and S2 normal without gallop, murmur, click, rub or other extra sounds. Abdomen:  - soft, non-tender, normal bowel sounds, obese, mildly distended,  no masses,  no guarding, no rigidity, no rebound tenderness, no hepatomegaly, and no splenomegaly.   - surgical sites clean, dry and intact with steri strips, without erythema or drainage   Pulses:  2+ radials  Neurologic:  alert & oriented X3 and cranial nerves II-XII intact grossly.     Impression & Recommendations:  Problem # 1:  CHOLELITHIASIS, SYMPTOMATIC (ICD-574.20) Assessment Improved  s/p lap cholecystectomy. normal exam except for mild bloating. Advised to avoid sodas, since as before, this appears to be the primary trigger for bloating for her. Advised flavored water if unable to tolerate plain water. Advised regarding Miralax for constipation, simethicone for bloating. Patient would likely benefit from further dietary modification given morbid obesity. Will defer this to PCP. Normal exam, no red flags. Follow up with surgery (Dr. Gerrit Friends) on 03/16/10. Will forward these notes to him.   Orders: [facility]- Est Level  3 (01093)  Complete Medication List: 1)  Hydrocodone-acetaminophen 5-500 Mg Tabs (Hydrocodone-acetaminophen) .Marland Kitchen.. 1 by mouth q6hr as needed pain  Patient Instructions: 1)  Add Crystal Light to your water to help flavor. 2)  Take Miralax daily if you notice you are constipated 3)  Take Gas-X daily if you notice that you are feeling bloated 4)  If you have vomiting, abdominal pain, severe diarrhea or other concerns you should follow up with [place] or your surgeons.  Appended Document: follow up gallstones,df faxed

## 2010-07-21 NOTE — Progress Notes (Signed)
Summary: triage  Phone Note Call from Patient Call back at [phone]   Caller: Patient Summary of Call: Pt having stomach issue and wants to see if she can come in today. Initial call taken by: Clydell Hakim,  December 13, 2009 1:52 PM  Follow-up for Phone Call        LUQ pain. pain is now a 6/10 when sitting. 9/10 when she moves.  told her we have no appt left. offered UC or appt tomorrow. she chose to go to UC. states she has been out of town & waited until she was back in [place] to call for an appt. asked her to call back for f/u appt after she is seen at [facility] Follow-up by: Golden Circle RN,  December 13, 2009 2:06 PM

## 2010-07-29 ENCOUNTER — Encounter: Payer: Self-pay | Admitting: *Deleted

## 2010-09-02 LAB — COMPREHENSIVE METABOLIC PANEL
AST: 16 U/L (ref 0–37)
Albumin: 3.9 g/dL (ref 3.5–5.2)
Chloride: 107 mEq/L (ref 96–112)
Creatinine, Ser: 0.7 mg/dL (ref 0.4–1.2)
GFR calc Af Amer: >60 mL/min (ref >60)
Potassium: 4 mEq/L (ref 3.5–5.1)
Sodium: 142 mEq/L (ref 135–145)
Total Bilirubin: 0.6 mg/dL (ref 0.3–1.2)

## 2010-09-02 LAB — CBC
MCH: 22.7 pg — ABNORMAL LOW (ref 26.0–34.0)
Platelets: 326 10*3/uL (ref 150–400)
RBC: 4.12 MIL/uL (ref 3.87–5.11)
WBC: 5.8 10*3/uL (ref 4.0–10.5)

## 2010-09-02 LAB — DIFFERENTIAL
Basophils Relative: 1 % (ref 0–1)
Eosinophils Absolute: 0.1 10*3/uL (ref 0.0–0.7)
Lymphs Abs: 1.8 10*3/uL (ref 0.7–4.0)
Monocytes Relative: 7 % (ref 3–12)

## 2010-09-02 LAB — URINALYSIS, ROUTINE W REFLEX MICROSCOPIC
Glucose, UA: NEGATIVE mg/dL
Leukocytes, UA: NEGATIVE
Protein, ur: NEGATIVE mg/dL
Specific Gravity, Urine: 1.024 (ref 1.005–1.030)
pH: 6 (ref 5.0–8.0)

## 2010-09-02 LAB — URINE MICROSCOPIC-ADD ON

## 2010-09-02 LAB — PREGNANCY, URINE: Preg Test, Ur: NEGATIVE

## 2010-09-04 LAB — POCT URINALYSIS DIP (DEVICE)
Bilirubin Urine: NEGATIVE
Glucose, UA: NEGATIVE mg/dL
Hgb urine dipstick: NEGATIVE
Nitrite: NEGATIVE
Urobilinogen, UA: 0.2 mg/dL (ref 0.0–1.0)

## 2010-10-10 ENCOUNTER — Other Ambulatory Visit (HOSPITAL_COMMUNITY)
Admission: RE | Admit: 2010-10-10 | Discharge: 2010-10-10 | Disposition: A | Discharge status: Home / Self Care | Payer: BC Managed Care – PPO | Source: Ambulatory Visit | Attending: Family Medicine | Admitting: Family Medicine

## 2010-10-10 ENCOUNTER — Encounter: Payer: Self-pay | Admitting: Family Medicine

## 2010-10-10 ENCOUNTER — Ambulatory Visit (INDEPENDENT_AMBULATORY_CARE_PROVIDER_SITE_OTHER): Payer: BC Managed Care – PPO | Admitting: Family Medicine

## 2010-10-10 DIAGNOSIS — Z124 Encounter for screening for malignant neoplasm of cervix: Secondary | ICD-10-CM

## 2010-10-10 DIAGNOSIS — E669 Obesity, unspecified: Secondary | ICD-10-CM

## 2010-10-10 DIAGNOSIS — R7309 Other abnormal glucose: Secondary | ICD-10-CM

## 2010-10-10 DIAGNOSIS — Z01419 Encounter for gynecological examination (general) (routine) without abnormal findings: Secondary | ICD-10-CM | POA: Insufficient documentation

## 2010-10-10 DIAGNOSIS — N760 Acute vaginitis: Secondary | ICD-10-CM

## 2010-10-10 LAB — POCT WET PREP (WET MOUNT)

## 2010-10-10 LAB — BASIC METABOLIC PANEL
BUN: 7 mg/dL (ref 6–23)
CO2: 24 mEq/L (ref 19–32)
Calcium: 9.2 mg/dL (ref 8.4–10.5)
Glucose, Bld: 94 mg/dL (ref 70–99)
Potassium: 3.9 mEq/L (ref 3.5–5.3)
Sodium: 138 mEq/L (ref 135–145)

## 2010-10-10 LAB — LIPID PANEL
Cholesterol: 149 mg/dL (ref 0–200)
HDL: 41 mg/dL (ref >39)

## 2010-10-10 NOTE — Patient Instructions (Signed)
It was nice to meet you today. Your blood pressure today was BP: 132/82 mmHg.  Remember your goal blood pressure is about 120/80.  Please watch how much salt and sodium are in your diet to help your blood pressure.  You have lost about 10 lbs since last year- great job! A healthier weight can help keep your blood pressure and blood sugar normal.  Please continue to make lifestyle changes including eating healthy and exercising regularly.  I am going to check your cholesterol, your blood sugar, and your kidney function today.  I will notify you of these results as well as the pap smear results.

## 2010-10-10 NOTE — Progress Notes (Signed)
Subjective:    Jodi Saunders is a 40 y.o. female who presents for Annual/Subsequent preventive examination.  She has questions about birth control options as she is allergic to latex, but sees her sexual partner infrequently and does not want to take hormonal medications.  She does not think she needs to worry about STD's but would like to be tested.   She also complains about "pimples" on her genital area.  She says she has some on the left side that have been there for a while, but she had one on the right side that got painful and drained.  That one is gone now.    Patient also wonders if she should get a mammogram.  She has no family history of breast cancer but sometimes thinks she feels a lump in her armpit.   Preventive Screening-Counseling & Management  Tobacco History  Smoking status  . Never Smoker   Smokeless tobacco  . Never Used     Current Medications (verified) Current Outpatient Prescriptions  Medication Sig Dispense Refill  . HYDROcodone-acetaminophen (VICODIN) 5-500 MG per tablet Take 1 tablet by mouth every 6 (six) hours as needed.           Allergies (verified) Latex and Sulfonamide derivatives   PAST HISTORY  Family History Family History  Problem Relation Age of Onset  . Diabetes Father   . Diabetes Paternal Grandmother   . Hypertension Paternal Grandmother   . Diabetes Paternal Grandfather   . Hypertension Paternal Grandfather     Social History History  Substance Use Topics  . Smoking status: Never Smoker   . Smokeless tobacco: Never Used  . Alcohol Use: No     Are there smokers in your home (other than you)? No  Risk Factors Current exercise habits: Gym/ health club routine includes cardio. She exercises 3 days a week.  Dietary issues discussed: Patient has made significant dietary changes in recent years- she says she does not eat any fried foods and tries to watch portion control.    obesity (BMI >= 30 kg/m2).   Are you sexually  active?  Yes Do you have more than one partner?  No Patient reports using no contraception or condoms.  She is allergic to latex.    Indicate any recent Medical Services you may have received from other [facility] providers in the past year (date may be approximate). Patient recently saw allergist for testing for peanut allergies.   Immunization History  Administered Date(s) Administered  . Td 05/20/2003    Review of Systems Pertinent items are noted in HPI.    Objective:       Body mass index is 41.62 kg/(m^2). BP 132/82  Pulse 86  Temp(Src) 98.4 F (36.9 C) (Oral)  Ht 5' 4.5" (1.638 m)  Wt 246 lb 4.8 oz (111.721 kg)  BMI 41.62 kg/m2  LMP 09/15/2010  General appearance: alert, cooperative, no distress and morbidly obese Eyes: conjunctivae/corneas clear. PERRL, EOM's intact. Fundi benign. Throat: lips, mucosa, and tongue normal; teeth and gums normal Lungs: clear to auscultation bilaterally Breasts: normal appearance, no masses or tenderness, No nipple retraction or dimpling, No nipple discharge or bleeding Heart: regular rate and rhythm, S1, S2 normal, no murmur, click, rub or gallop Pelvic: cervix normal in appearance, no adnexal masses or tenderness, no cervical motion tenderness, rectovaginal septum normal, uterus normal size, shape, and consistency, vagina normal without discharge and External exam reveals two 5mm cysts, not open or draining on left labia majora.  Extremities:  extremities normal, atraumatic, no cyanosis or edema Lymph nodes: Cervical, supraclavicular, and axillary nodes normal.     Assessment:     40 year old female with obesity     Plan:   Encouraged pt to continue lifestyle changes (diet and exercise).  Pap and GC/Chlamydia/wet prep labs done.  For contraception, pt is not interested in hormonal options, but is open to using non-latex condoms. No indication for early mammogram.

## 2010-10-11 LAB — GC/CHLAMYDIA PROBE AMP, GENITAL
Chlamydia, DNA Probe: NEGATIVE
GC Probe Amp, Genital: NEGATIVE

## 2010-10-11 NOTE — Assessment & Plan Note (Signed)
A1C one year ago in prediabetic range.  Will check fasting blood sugar today.

## 2010-10-11 NOTE — Assessment & Plan Note (Addendum)
Patient has been somewhat successful in the past year with weight loss, encouraged her to continue making lifestyle changes.  Will check fasting lipid profile and BMET today as she is at high risk for HLD and DM.

## 2010-11-01 NOTE — Op Note (Signed)
[name]                ACCOUNT NO.:  [id number]   MEDICAL RECORD NO.:  [id number]          PATIENT TYPE:  AMB   LOCATION:  SDS                          FACILITY:  [facility]   PHYSICIAN:  Hermelinda Medicus, M.D.   DATE OF BIRTH:  Dec 04, 1970   DATE OF PROCEDURE:  10/26/2006  DATE OF DISCHARGE:                               OPERATIVE REPORT   PREOPERATIVE DIAGNOSIS:  Tonsillitis.   POSTOPERATIVE DIAGNOSIS:  Tonsillitis.   OPERATION:  Tonsillectomy.   ANESTHESIA:  General endotracheal with Dr. Michelle Piper.   SURGEON:  Hermelinda Medicus, M.D.   The patient aware of risks and gains, a risk of bleeding.  She is on a  soft bland diet and cannot travel for approximately 10 days and we  discussed the risk of anesthesia and the surgery.   PROCEDURE:  The placed in the supine position under general orotracheal  anesthesia.  The patient was prepped and draped in the appropriate  manner.  The usual head drape was used and the [place] mouth gag was used  and the tonsils were exposed.  There was considerable exudate within the  tonsils, grasped with a tenaculum, removed using Bovie coagulation and  blunt dissection.  All hemostasis was established with Bovie  electrocoagulation.  The right side was considerably more difficult than  the left.  It was full of old purulent cottage cheesy-like material and  some active purulence.  The patient will be on antibiotics.  Once the  tonsils were removed and the stomach was suctioned, nasopharynx was  checked and found to be clear and the tonsillar beds were carefully  cleared of any blood.  The blood loss was approximately 20 mL and the  patient tolerated the procedure very well.  She will follow-up in five  days, 10 days and three weeks.           ______________________________  Hermelinda Medicus, M.D.     JC/MEDQ  D:  10/26/2006  T:  10/26/2006  Job:  [id number]   cc:   Tawnya Crook Erenest Rasher, M.D.
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
## 2010-11-01 NOTE — Discharge Summary (Signed)
NAMERedge [name]                ACCOUNT NO.:  [id number]   MEDICAL RECORD NO.:  [id number]          PATIENT TYPE:  WOC   LOCATION:  WOC                          FACILITY:  [facility]   PHYSICIAN:  Ginger Carne, MD  DATE OF BIRTH:  28-Jul-1970   DATE OF ADMISSION:  08/23/2007  DATE OF DISCHARGE:                               DISCHARGE SUMMARY   HISTORY OF PRESENT ILLNESS:  This is a 40 year old female who presents  for her yearly exam and Pap smear.  She has a history of HSIL and  subsequent LEEP on September 01, 2003.  She also had an ASCUS Pap smear in  January 2006.  She was high risk HPV negative.  She is doing well.  She  works at the Ambulance person at [employer].  She reports no GI, GU, or  cardiac complaints today.  She reports her menstrual cycle is regular.  She reports no new medical problems and no surgeries since her last  visit.   CURRENT MEDICATIONS:  Allegra-D and multivitamin.   REVIEW OF SYSTEMS:  Negative.   PHYSICAL EXAMINATION:  VITAL SIGNS:  Temperature 97.3, pulse 68, blood  pressure 127/85, weight is 240.3.  She is 5 feet 3 inches.  GENERAL:  She is a well nourished, obese, no apparent distress.  LUNGS:  Clear to auscultation bilaterally.  CARDIAC:  Regular rate and rhythm.  ABDOMEN:  Obese, soft, nontender.  GENITALIA:  She is a Tanner V vaginal pink mucosa, normal rugae.  Cervix  is large.  Uterus is palpable, nontender.  No cervical motion  tenderness.   ASSESSMENT:  This is a 40 year old female with a history of high grade  squamous intraepithelial lesion here for a Pap smear.   PLAN:  1. Pap done today.  2. She reports that she wants to come back for a fasting lipid panel      and HIV and RPR within this next week.  3. The patient is to return for her yearly in one year.      Ruthe Mannan, M.D.      Ginger Carne, MD  Electronically Signed    TA/MEDQ  D:  08/23/2007  T:  08/23/2007  Job:  [id number]

## 2010-11-04 NOTE — Group Therapy Note (Signed)
NAMERedge Gainer Yamilee                ACCOUNT NO.:  [id number]   MEDICAL RECORD NO.:  [id number]          PATIENT TYPE:  WOC   LOCATION:  [facility]                   FACILITY:  [facility]   PHYSICIAN:  Kathlyn Sacramento, M.D.   DATE OF BIRTH:  02-27-1971   DATE OF SERVICE:                                    CLINIC NOTE   CHIEF COMPLAINT:  Here for a Pap smear.   HISTORY OF PRESENT ILLNESS:  The patient is a 40 year old African-American  female here for her Pap smear.  She has a history of high-grade SIL and  subsequent LEEP on September 01, 2003.  She had a negative Pap smear in August  of 2006 and ASCUS Pap smear in January of 2006, but was HPV negative.   CURRENT MEDICATIONS:  None.   REVIEW OF SYSTEMS:  No pelvic pain or abnormal bleeding.   PHYSICAL EXAMINATION:  VITAL SIGNS: Temp 97.7, pulse 94, blood pressure  135/98 and repeat 123/87, weight 237.4.  GENERAL:  A well-developed, well-nourished female in no acute distress.  GENITOURINARY EXAM:  Normal external genitalia.  The vagina pink and  rugated.  Cervix parous and well healed from LEEP.  A Pap smear done.   ASSESSMENT:  A 40 year old female with a history of high-grade squamous  intraepithelial here for third follow up Pap smear.   PLAN:  1.  Pap smear done today.  2.  The patient can go to yearly Pap smears if this Pap smear is normal.           ______________________________  Kathlyn Sacramento, M.D.     AC/MEDQ  D:  07/25/2005  T:  07/25/2005  Job:  [id number]

## 2010-11-04 NOTE — Group Therapy Note (Signed)
NAMERedge Gainer Belky                ACCOUNT NO.:  [id number]   MEDICAL RECORD NO.:  [id number]          PATIENT TYPE:  WOC   LOCATION[facility]  [facility]                   FACILITY:  [facility]   PHYSICIAN:  Elsie Lincoln, MD      DATE OF BIRTH:  [birth year]   DATE OF SERVICE:  07/19/2004                                    CLINIC NOTE   REASON FOR VISIT:  The patient presents for follow-up of abnormal Pap smear.  She underwent a LEEP for high-grade SIL on biopsy on September 01, 2003.  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  She had a Pap smear again in  June 2005 which showed low grade.  Today, she presents for Pap smear.  The  patient also states that she sees particles in the toilet after urinating.  She drinks over a half gallon of water a day and states that her urine is  light yellow to clear.  She does not think it is coming from her rectum or  from her vagina.  She does not complain of dysuria.  Unsure what is going on  with the patient.  Will at least do a urinalysis today to evaluate if any  sediment or casts are place.  The patient also states she has been trying to  lose weight.  However, she had not lost any in the past 6 months.  I  reviewed her diet and made some suggestions to limit sweets, fatty foods,  and fast food in general.  The patient needs follow-up Pap smear in 6 months  for the low-grade that occurred in June.  If the urinalysis is abnormal, or  the Pap smear is worse than low grade we will call the patient for  colposcopy.      KL/MEDQ  D:  07/19/2004  T:  07/19/2004  Job:  [id number]
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
## 2010-11-04 NOTE — Group Therapy Note (Signed)
NAME:  Jodi Saunders, Jodi Saunders                          ACCOUNT NO.:  [id number]   MEDICAL RECORD NO.:  [id number]                   PATIENT TYPE:  OUT   LOCATION:  [facility]                           FACILITY:  [facility]   PHYSICIAN:  Elsie Lincoln, MD                   DATE OF BIRTH:  08/04/1970   DATE OF SERVICE:  12/15/2003                                    CLINIC NOTE   REASON FOR VISIT:  A 40 year old female status post LEEP for high-grade SIL  on biopsy.  The patient had the LEEP on September 01, 2003.  The patient's  pathology revealed mild and focally moderate squamous dysplasia, CIN-1, and  focal CIN-2.  There was just mild dysplasia focally at the exocervcial  margin.  Endocervical margin not involved.  The patient is here for follow-  up Pap smear 3-and-a-half months after LEEP.   PHYSICAL EXAMINATION:  Cervix well healed.  Pap smear done.   ASSESSMENT AND PLAN:  A 40 year old female with cervical dysplasia for  follow-up Pap smear after LEEP.   1. Pap smear done.  2. Return to clinic in 6 months for repeat Pap smear.                                               Elsie Lincoln, MD    KL/MEDQ  D:  12/15/2003  T:  12/15/2003  Job:  [id number]
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
## 2010-11-04 NOTE — Group Therapy Note (Signed)
Jodi Saunders                ACCOUNT NO.:  [id number]   MEDICAL RECORD NO.:  [id number]          PATIENT TYPE:  WOC   LOCATION[facility]                   FACILITY:  [facility]   PHYSICIAN:  Elsie Lincoln, MD      DATE OF BIRTH:  02/13/1971   DATE OF SERVICE:                                    CLINIC NOTE   HISTORY OF PRESENT ILLNESS:  The patient is a 40 year old female who  presents for yearly exam and also Pap smear.  She has a history of a high-  grade SIL and subsequent LEEP on September 01, 2003.  She had ASCUS Pap smear in  January of 2006.  However, she was high-risk HPV negative.  The patient is  doing well.  She currently works in Engineer, site at [employer].  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  The patient reports no complaints gynecologically  today.  She still has monthly menses, and is not sexually active.  She  reports no new medical problems, and has not had any surgeries since her  last visit.   CURRENT MEDICATIONS:  Allegra-D, and multivitamin.   REVIEW OF SYSTEMS:  Negative.   PHYSICAL EXAMINATION:  VITAL SIGNS:  Temperature 98.1, pulse 90, blood  pressure 120/82, weight 238.5.  This is a 5-pound weight loss in 6 months.  Height 5 feet 3-3/4 inches.  GENERAL:  Well nourished, and well developed in no apparent distress.  NECK:  Supple.  No masses.  No thyromegaly.  LUNGS:  Clear to auscultation bilaterally.  HEART:  Regular rate and rhythm.  ABDOMEN:  Obese, nontender, soft.  GENITALIA:  Tanner 5.  Vagina pink.  Normal rugae.  Cervix large, well  healed from LEEP.  Uterus difficult to palpate, but nontender.  Adnexa:  No  masses, nontender.   ASSESSMENT AND PLAN:  A 40 year old female with a history of high-grade  squamous intraepithelial lesion for Pap smear.   PLAN:  1.  Pap smear done today.  2.  We will call the patient if Pap smear is abnormal, and needs colposcopy.  3.  The  patient will return in 6 months for Pap smear.  4.  In March 2007, she can go to yearly Pap smears.      KL/MEDQ  D:  01/17/2005  T:  01/17/2005  Job:  [id number]

## 2010-11-04 NOTE — Group Therapy Note (Signed)
NAME:  Jodi Saunders, Jodi Saunders                          ACCOUNT NO.:  [id number]   MEDICAL RECORD NO.:  [id number]                   PATIENT TYPE:  OUT   LOCATION[facility]                           FACILITY:  [facility]   PHYSICIAN:  Elsie Lincoln, MD                   DATE OF BIRTH:  03-03-1971   DATE OF SERVICE:  09/22/2003                                    CLINIC NOTE   REASON FOR VISIT:  The patient is status post LEEP on September 01, 2003 for  high-grade SIL on biopsy.  A Fisher LEEP was done under colposcopy with  rollerballing on the edges.  The patient complains of what she considers  malodorous discharge with a yellowish tint.  There is no bleeding or black  charred discharge.  The patient did have her last menstrual period September 11, 2003 which was uneventful.  Upon physical exam there is scant physiologic  mucus in the vault.  The cervix appears well healed.  There is no evidence  of infection.   ASSESSMENT AND PLAN:  69. A 40 year old female status post loop electrocautery excision procedure.     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  2. Normal gynecological examination today.  3. Wet prep, gonorrhea, chlamydia culture sent.  4. Return to clinic December 15, 2003 for Pap smear.                                               Elsie Lincoln, MD    KL/MEDQ  D:  09/22/2003  T:  09/22/2003  Job:  [id number]

## 2011-02-14 ENCOUNTER — Ambulatory Visit (INDEPENDENT_AMBULATORY_CARE_PROVIDER_SITE_OTHER): Payer: BC Managed Care – PPO | Admitting: Family Medicine

## 2011-02-14 ENCOUNTER — Encounter: Payer: Self-pay | Admitting: Family Medicine

## 2011-02-14 VITALS — BP 131/84 | HR 76 | Temp 98.0°F

## 2011-02-14 DIAGNOSIS — N920 Excessive and frequent menstruation with regular cycle: Secondary | ICD-10-CM

## 2011-02-14 DIAGNOSIS — D649 Anemia, unspecified: Secondary | ICD-10-CM

## 2011-02-14 NOTE — Patient Instructions (Signed)
Come back and see me in 1-2 weeks to go over your results

## 2011-02-15 LAB — CBC
HCT: 28.4 % — ABNORMAL LOW (ref 36.0–46.0)
Hemoglobin: 8.7 g/dL — ABNORMAL LOW (ref 12.0–15.0)
MCH: 21 pg — ABNORMAL LOW (ref 26.0–34.0)
MCHC: 30.6 g/dL (ref 30.0–36.0)
RDW: 16.6 % — ABNORMAL HIGH (ref 11.5–15.5)

## 2011-02-15 LAB — IRON AND TIBC
%SAT: 13 % — ABNORMAL LOW (ref 20–55)
TIBC: 441 ug/dL (ref 250–470)

## 2011-02-16 LAB — HEMOGLOBINOPATHY EVALUATION
Hgb A2 Quant: 4.8 % — ABNORMAL HIGH (ref 2.2–3.2)
Hgb A: 95.2 % — ABNORMAL LOW (ref 96.8–97.8)

## 2011-02-17 DIAGNOSIS — N920 Excessive and frequent menstruation with regular cycle: Secondary | ICD-10-CM | POA: Insufficient documentation

## 2011-02-17 NOTE — Progress Notes (Signed)
  Subjective:    Patient ID: Jodi Saunders, female    DOB: [birth year], 40 y.o.   MRN: [id number]  HPI  Pt with lifetime hx of anemia.  Her mother has B thal trait.  When she takes iron supplements, she feels better but her hgb never gets much above 10.  She has also had heavy periods since menarche.  For the first 3 days she will use 5-6 heavy pads, then it will be lighter fo rthe next 4 days.  They are painful as well and she will often feel very tired.  SHe was on OCPs briefly years ago but cannot remember if they helped her cycles.  She did not have improvements in periods after childbirth.    Review of Systems Denies CP, SOB, HA, N/V/D, fever     Objective:   Physical Exam Vital signs reviewed General appearance - alert, well appearing, and in no distress and oriented to person, place, and time Heart - normal rate, regular rhythm, normal S1, S2, no murmurs, rubs, clicks or gallops Abdomen - soft, nontender, nondistended, no masses or organomegaly Chest - clear to auscultation, no wheezes, rales or rhonchi, symmetric air entry, no tachypnea, retractions or cyanosis       Assessment & Plan:  ANEMIA, OTHER, UNSPECIFIED Will check hgb electrophoresis to see if pt is B thal trait.  Will check iron panel to see if pt would actually benefit from supplementaion.  Pt to RTC to discuss.  Heavy periods Pt wants least "invasive" (least drugs) as possible to regulate periods.  She would like anemia dealt with first, then she would like to consider GYN referral.

## 2011-02-17 NOTE — Assessment & Plan Note (Signed)
Will check hgb electrophoresis to see if pt is B thal trait.  Will check iron panel to see if pt would actually benefit from supplementaion.  Pt to RTC to discuss.

## 2011-02-17 NOTE — Assessment & Plan Note (Signed)
Pt wants least "invasive" (least drugs) as possible to regulate periods.  She would like anemia dealt with first, then she would like to consider GYN referral.

## 2011-02-27 ENCOUNTER — Ambulatory Visit (INDEPENDENT_AMBULATORY_CARE_PROVIDER_SITE_OTHER): Payer: BC Managed Care – PPO | Admitting: Family Medicine

## 2011-02-27 ENCOUNTER — Encounter: Payer: Self-pay | Admitting: Family Medicine

## 2011-02-27 VITALS — BP 153/90 | HR 105 | Temp 98.1°F | Wt 245.0 lb

## 2011-02-27 DIAGNOSIS — D649 Anemia, unspecified: Secondary | ICD-10-CM

## 2011-02-27 MED ORDER — IRON 325 (65 FE) MG PO TABS: 1.0000 | ORAL_TABLET | Freq: Three times a day (TID) | ORAL | Status: DC

## 2011-02-27 NOTE — Assessment & Plan Note (Signed)
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  WIll have her take iron TID and recheck in 2 months.

## 2011-02-27 NOTE — Patient Instructions (Signed)
Please take iron 3 x per day If you get constipated, please try colace (over the counter)  We will recheck your iron stores in 2 months

## 2011-02-27 NOTE — Progress Notes (Signed)
  Subjective:    Patient ID: Jodi Saunders, female    DOB: April 23, 1971, 40 y.o.   MRN: [id number]  HPI Pt returns for f/u of anemia.  She has been taking iron 1/day x 2weeks without stomach upset.  She is starting her period today.     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  WIll have her take iron TID and recheck in 2 months.    Reviewed labs with pt.

## 2011-06-27 ENCOUNTER — Emergency Department (HOSPITAL_COMMUNITY)
Admission: EM | Admit: 2011-06-27 | Discharge: 2011-06-27 | Discharge status: Against Medical Advice | Payer: BC Managed Care – PPO | Attending: Emergency Medicine | Admitting: Emergency Medicine

## 2011-06-27 ENCOUNTER — Encounter (HOSPITAL_COMMUNITY): Payer: Self-pay | Admitting: Emergency Medicine

## 2011-06-27 DIAGNOSIS — R509 Fever, unspecified: Secondary | ICD-10-CM | POA: Insufficient documentation

## 2011-06-27 DIAGNOSIS — IMO0001 Reserved for inherently not codable concepts without codable children: Secondary | ICD-10-CM | POA: Insufficient documentation

## 2011-06-27 NOTE — ED Notes (Signed)
Pt alert, nad, c/o fever, body aches, onset today, denies n/v, denies changes in bowel or bladder, denies cough sob

## 2011-06-28 ENCOUNTER — Encounter: Payer: Self-pay | Admitting: Family Medicine

## 2011-06-28 ENCOUNTER — Ambulatory Visit (INDEPENDENT_AMBULATORY_CARE_PROVIDER_SITE_OTHER): Payer: BC Managed Care – PPO | Admitting: Family Medicine

## 2011-06-28 VITALS — BP 166/97 | HR 60 | Temp 101.4°F | Ht 64.0 in | Wt 252.0 lb

## 2011-06-28 DIAGNOSIS — R300 Dysuria: Secondary | ICD-10-CM

## 2011-06-28 DIAGNOSIS — R509 Fever, unspecified: Secondary | ICD-10-CM | POA: Insufficient documentation

## 2011-06-28 LAB — POCT URINALYSIS DIPSTICK
Bilirubin, UA: NEGATIVE
Blood, UA: NEGATIVE
Ketones, UA: NEGATIVE
Leukocytes, UA: NEGATIVE
Spec Grav, UA: 1.015
pH, UA: 7

## 2011-06-28 NOTE — Progress Notes (Signed)
Jodi Saunders is a 41 year old woman who presents with 3 days of chills fever muscle pain abdominal pain. She denies any diarrhea but does notice that several days she's had a bowel movement. Her last missed her period was December 25. She denies any cough sneezing runny nose or dyspnea or chest pain. She is eating and drinking normally.  PMH reviewed.  ROS as above otherwise neg Medications reviewed. Current Outpatient Prescriptions  Medication Sig Dispense Refill  . Ferrous Sulfate (IRON) 325 (65 FE) MG TABS Take 1 tablet by mouth 3 (three) times daily.  90 each  6  . HYDROcodone-acetaminophen (VICODIN) 5-500 MG per tablet Take 1 tablet by mouth every 6 (six) hours as needed.         Exam:  BP 166/97  Pulse 60  Temp(Src) 101.4 F (38.6 C) (Oral)  Ht 5\' 4"  (1.626 m)  Wt 252 lb (114.306 kg)  BMI 43.26 kg/m2 Gen: Well NAD HEENT: EOMI,  MMM Lungs: CTABL Nl WOB Heart: RRR no MRG Abd: NABS, NT, ND, no costovertebral angle tenderness to palpation Exts: Non edematous BL  LE, warm and well perfused.

## 2011-06-28 NOTE — Assessment & Plan Note (Signed)
This is possibly influenza or viral gastroenteritis. Abdomen is non-tender. She has no other red flag signs or symptoms. Plan for conservative therapy with Tylenol rest and and continue oral intake. She may be constipated therefore recommend mag citrate as needed. Encouraged her to followup if not improving. She expresses understanding.

## 2011-06-28 NOTE — Patient Instructions (Signed)
Thank you for coming in today. I think you have a virus and are constipated.  Use mag citrate as needed to have a BM.  Use tylenol as needed for symptoms.  Come back on Friday if you are getting worse.  I expect that you should start feeling better soon.

## 2011-06-30 ENCOUNTER — Encounter: Payer: Self-pay | Admitting: Family Medicine

## 2011-06-30 ENCOUNTER — Ambulatory Visit (INDEPENDENT_AMBULATORY_CARE_PROVIDER_SITE_OTHER): Payer: BC Managed Care – PPO | Admitting: Family Medicine

## 2011-06-30 VITALS — BP 153/99 | HR 84 | Temp 98.1°F | Ht 64.0 in | Wt 250.0 lb

## 2011-06-30 DIAGNOSIS — N898 Other specified noninflammatory disorders of vagina: Secondary | ICD-10-CM

## 2011-06-30 DIAGNOSIS — N939 Abnormal uterine and vaginal bleeding, unspecified: Secondary | ICD-10-CM

## 2011-06-30 DIAGNOSIS — R509 Fever, unspecified: Secondary | ICD-10-CM

## 2011-06-30 DIAGNOSIS — R030 Elevated blood-pressure reading, without diagnosis of hypertension: Secondary | ICD-10-CM

## 2011-06-30 DIAGNOSIS — I10 Essential (primary) hypertension: Secondary | ICD-10-CM | POA: Insufficient documentation

## 2011-06-30 NOTE — Progress Notes (Signed)
  Subjective:    Patient ID: Jodi Saunders, female    DOB: July 25, 1970, 41 y.o.   MRN: [id number]  HPI  Here for follow-up of viral gastroenteritis for 3 days.  Was seen at [facility] on the 7th but left before being seen due to fever improving and long wait.  Was seen by Dr. Denyse Amass on Dec 8th and dx with viral gastroenteritis and constipation.  Treated with tylenol and magnesium citrate, very effective.  Yesterday was first day starting to eat, mild gas, stomach cramps, unable to tolerate scrambled eggs.  Able to tolerate small amounts of soup.  This morning able to tolerate two pieces of bacon and a [place] biscuit.  Also drinking naked juice and ginger ale.  Having abdominal pain and bloating.    Also noting toilet tissue pink but no soiling of underwear, states it is from her vagina.  LMP Dec 23rd-29.  U/A normal dec 8.  No vaginal discharge or dysuria.  Frustrated she is not feeling better and able to eat normal foods. Review of Systemssee hpi      Objective:   Physical Exam  GEN: Alert & Oriented, No acute distress, well appearing CV:  Regular Rate & Rhythm, no murmur Respiratory:  Normal work of breathing, CTAB Abd:  + BS, soft, no tenderness to palpation Ext: no pre-tibial edema       Assessment & Plan:

## 2011-06-30 NOTE — Patient Instructions (Signed)
Follow-up with yoru primary doctor to discuss your blood pressure  Take is easy on foods, avoid greasy or heavy foods.  Its ok if you can only drink fluids and bland foods for several days

## 2011-06-30 NOTE — Assessment & Plan Note (Signed)
Discussed this with patient, appears she may also have had elevated BP back in September.  Advised her to make follow-up with PCP for confirmation when not sick and to discuss starting medication management.

## 2011-06-30 NOTE — Assessment & Plan Note (Addendum)
Normal course of resolution for viral gastroenteritis.  Encouraged her to slow down on heavy foods if she continues to have nausea with them and stick to bland foods and liquids.  Reassured her this is a normal course of improvement and not surprising to have some residual symptoms.  She is concerned of 3 days of light spotting, will check upreg, not alarming given recent stress.  Advised to follow-up with PCP if does not regulate after this single episode.

## 2011-06-30 NOTE — Progress Notes (Signed)
Addended by: [place], Sanita Estrada on: 06/30/2011 12:04 PM   Modules accepted: Orders

## 2011-07-06 ENCOUNTER — Encounter: Payer: Self-pay | Admitting: Family Medicine

## 2011-07-06 ENCOUNTER — Ambulatory Visit (INDEPENDENT_AMBULATORY_CARE_PROVIDER_SITE_OTHER): Payer: BC Managed Care – PPO | Admitting: Family Medicine

## 2011-07-06 VITALS — BP 140/90 | HR 80 | Temp 98.5°F | Ht 64.0 in | Wt 252.6 lb

## 2011-07-06 DIAGNOSIS — R030 Elevated blood-pressure reading, without diagnosis of hypertension: Secondary | ICD-10-CM

## 2011-07-06 NOTE — Progress Notes (Signed)
  Subjective:    Patient ID: Jodi Saunders, female    DOB: [birth year], 41 y.o.   MRN: [id number]  HPI  Here today for blood pressure recheck. Her GI symptoms are better and she is no more vaginal spotting. She says that she is enrolled in a working our way to wellness class at her work. This involving nutritionists as well as diet and exercise coaching. She denies headache, chest pain, shortness of breath. We discussed the plate model as well as increasing her fruits and vegetables and decreasing her salt intake. We discussed avoiding canned vegetables in favor of frozen or fresh.  Review of Systems    Denies CP, SOB, HA, N/V/D, fever  Objective:   Physical Exam Vital signs reviewed General appearance - alert, well appearing, and in no distress and oriented to person, place, and time Heart - normal rate, regular rhythm, normal S1, S2, no murmurs, rubs, clicks or gallops Chest - clear to auscultation, no wheezes, rales or rhonchi, symmetric air entry, no tachypnea, retractions or cyanosis        Assessment & Plan:

## 2011-07-06 NOTE — Assessment & Plan Note (Signed)
BP Readings from Last 3 Encounters:  07/06/11 140/90  06/30/11 153/99  06/28/11 166/97   Blood pressures have been elevated due to recent sickness. Today borderline hypertensive. Starting a work Medical illustrator. We'll see back in 2 months to reevaluate. If patient's blood pressure still elevated at that time, we'll start a new medication.

## 2011-07-06 NOTE — Patient Instructions (Signed)
Great Job starting to work on diet and exercise!  Keep up the good work Try to eat 2x as many vegetables as protein or stach  Come back and see me in 2 months for a recheck

## 2011-07-11 IMAGING — CR DG CHEST 2V
2 series · 2 of 2 positions shown · non-contrast
Comparison: None.

CLINICAL DATA: Preop.

CHEST - 2 VIEW

[w chest pa]
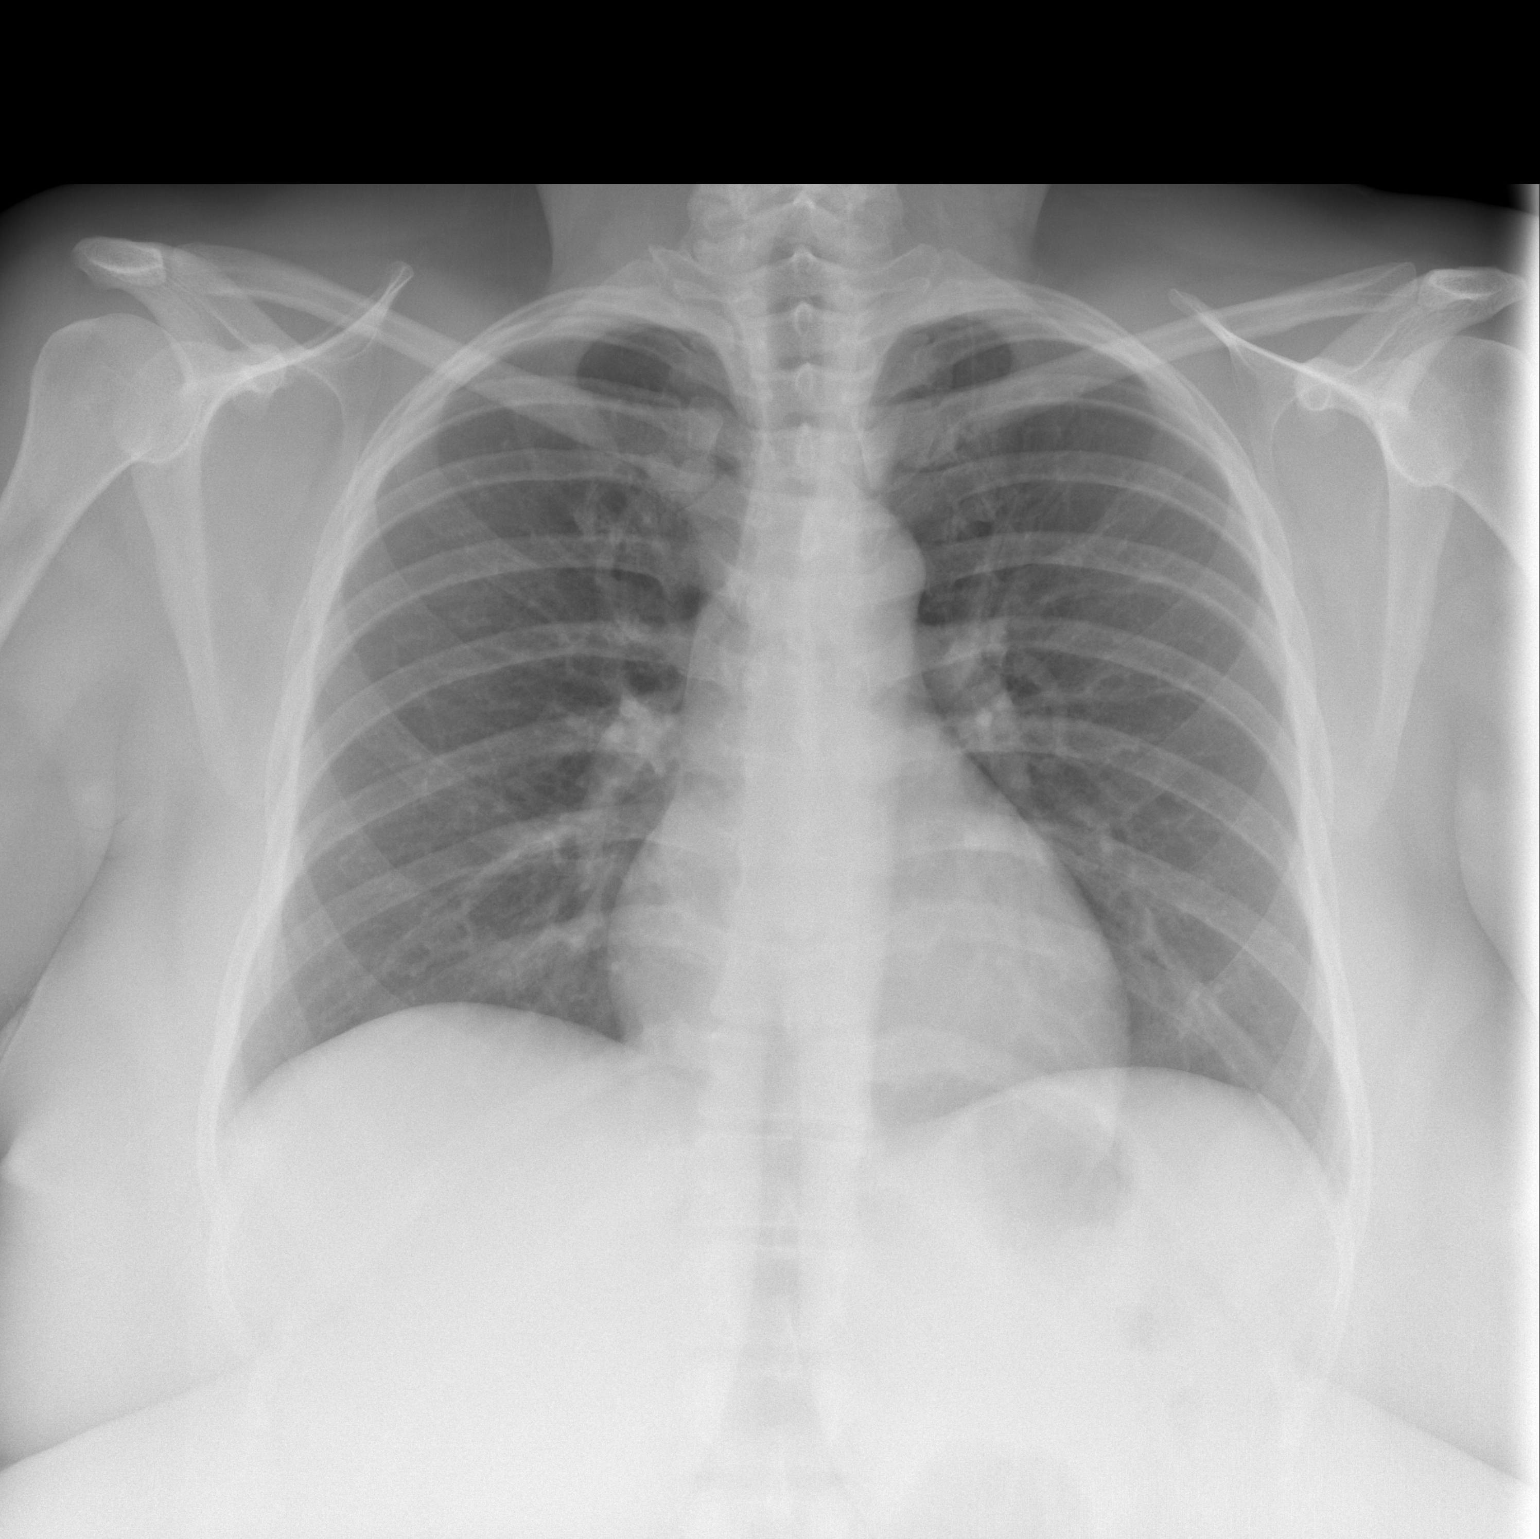

[w chest lat]
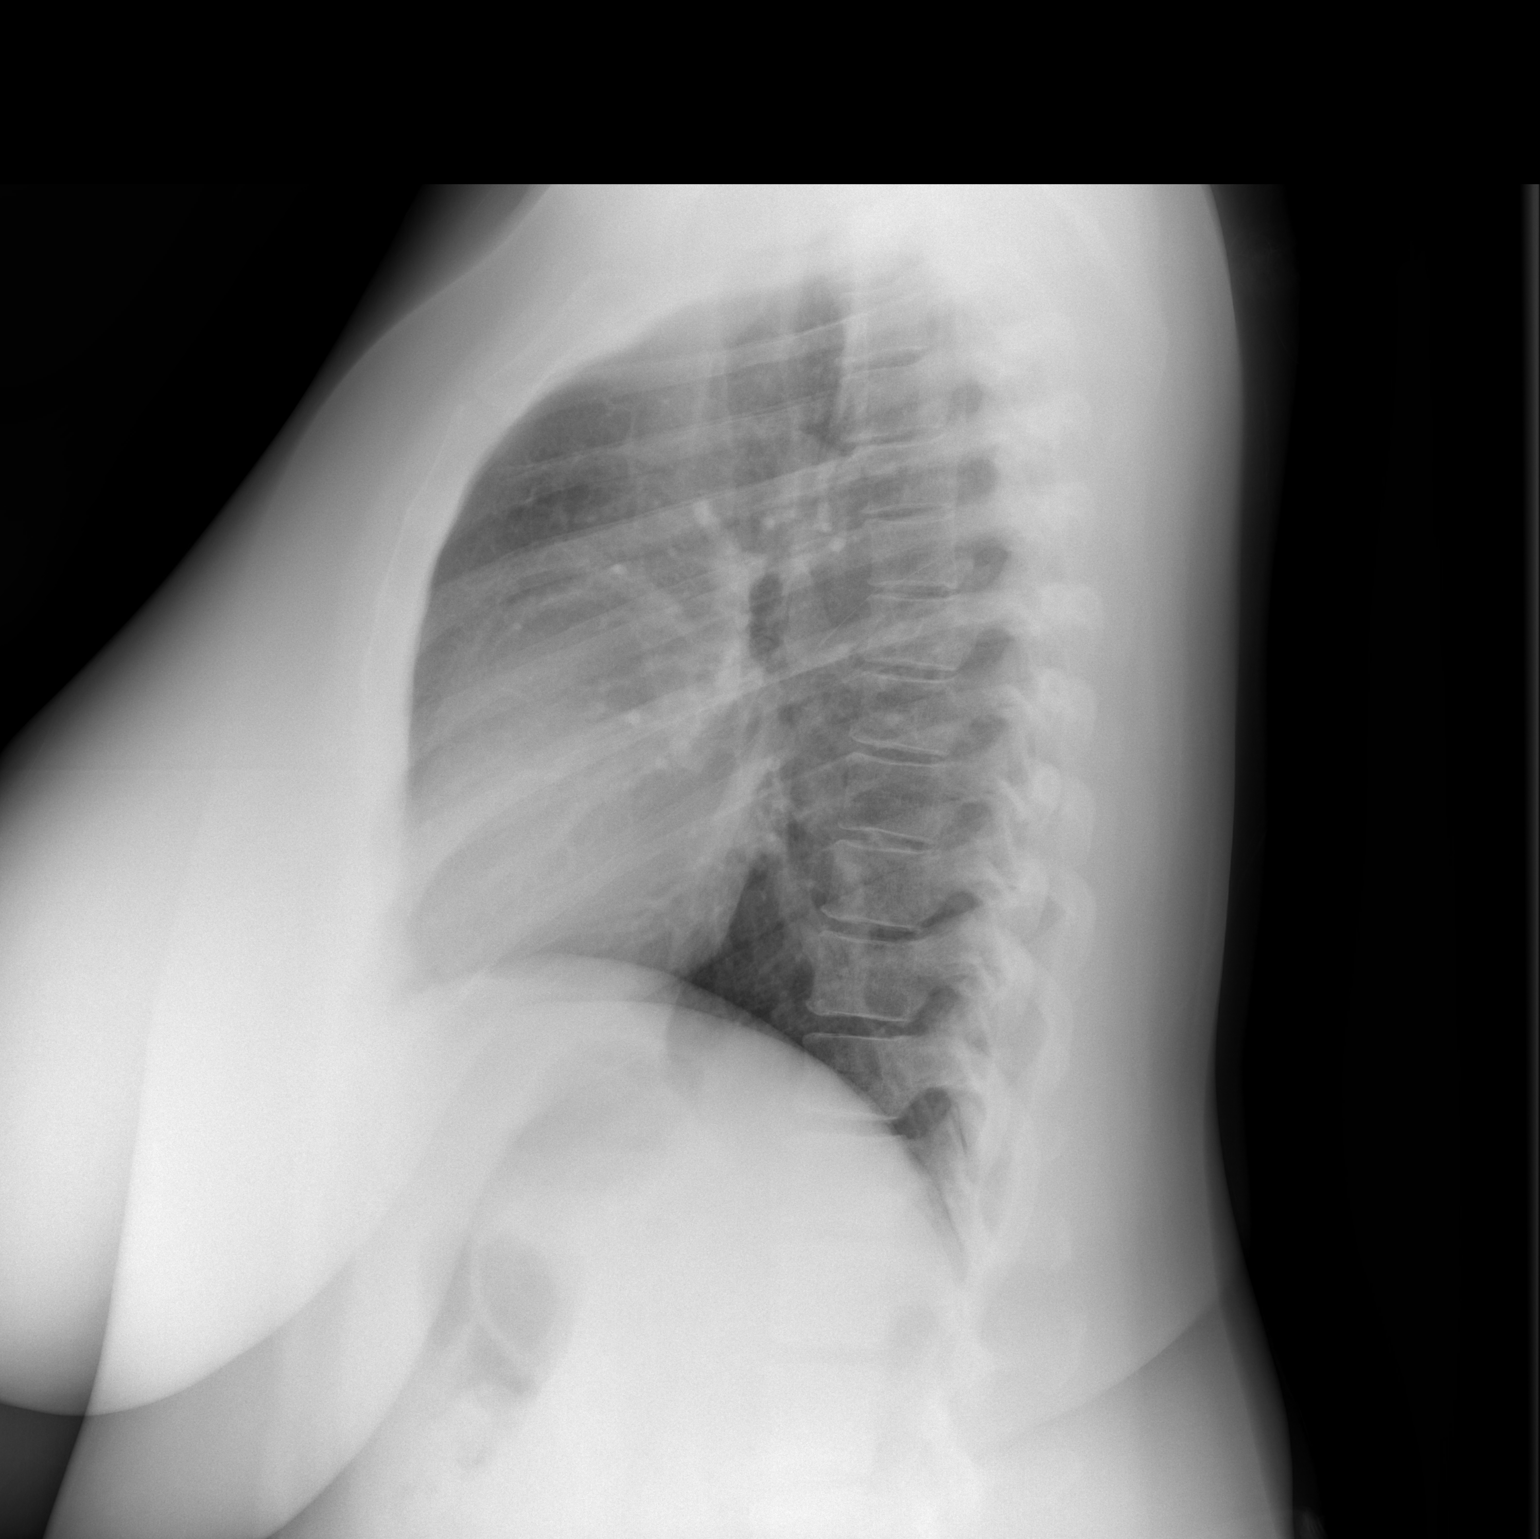

[2 of 2 positions shown; findings below may reference images not displayed]

FINDINGS: Trachea is midline.  Heart size normal.  Lungs are clear.
No pleural fluid.
IMPRESSION: No acute findings.

## 2011-07-19 ENCOUNTER — Other Ambulatory Visit: Payer: Self-pay | Admitting: Family Medicine

## 2011-08-28 ENCOUNTER — Ambulatory Visit
Admission: RE | Admit: 2011-08-28 | Discharge: 2011-08-28 | Disposition: A | Discharge status: Home / Self Care | Payer: BC Managed Care – PPO | Source: Ambulatory Visit | Attending: Family Medicine | Admitting: Family Medicine

## 2011-08-28 ENCOUNTER — Encounter: Payer: Self-pay | Admitting: Family Medicine

## 2011-08-28 ENCOUNTER — Ambulatory Visit (INDEPENDENT_AMBULATORY_CARE_PROVIDER_SITE_OTHER): Payer: BC Managed Care – PPO | Admitting: Family Medicine

## 2011-08-28 ENCOUNTER — Encounter: Payer: BC Managed Care – PPO | Admitting: Family Medicine

## 2011-08-28 VITALS — BP 136/83 | HR 88 | Temp 98.3°F | Ht 64.0 in | Wt 249.0 lb

## 2011-08-28 DIAGNOSIS — Z01419 Encounter for gynecological examination (general) (routine) without abnormal findings: Secondary | ICD-10-CM | POA: Insufficient documentation

## 2011-08-28 DIAGNOSIS — N920 Excessive and frequent menstruation with regular cycle: Secondary | ICD-10-CM

## 2011-08-28 DIAGNOSIS — Z0000 Encounter for general adult medical examination without abnormal findings: Secondary | ICD-10-CM

## 2011-08-28 DIAGNOSIS — Z91010 Allergy to peanuts: Secondary | ICD-10-CM | POA: Insufficient documentation

## 2011-08-28 DIAGNOSIS — Z23 Encounter for immunization: Secondary | ICD-10-CM

## 2011-08-28 DIAGNOSIS — M25559 Pain in unspecified hip: Secondary | ICD-10-CM

## 2011-08-28 MED ORDER — EPINEPHRINE 0.3 MG/0.3ML IJ DEVI: 0.3000 mg | Freq: Once | INTRAMUSCULAR | Status: DC

## 2011-08-28 NOTE — Assessment & Plan Note (Signed)
Anterior hip pain and some limitation of internal rotation.  Send for xrays. She describes a pop.  ?be useful to send to [facility] to eval further if xrays not helpful.

## 2011-08-28 NOTE — Assessment & Plan Note (Signed)
Discussed options for mammogram screening.  Pt choses to start now and consider 1-2 year f/u.

## 2011-08-28 NOTE — Patient Instructions (Signed)
Please go to [facility] for your hip film Please get your mammogram (you call to schedule)  We will call you with the GYN appt I have sent in your epi pen  I will call you with your xray results.

## 2011-08-28 NOTE — Assessment & Plan Note (Signed)
Pt with heavy painful periods, desires to discuss surgical options.  Will refer to GYN

## 2011-08-28 NOTE — Progress Notes (Signed)
  Subjective:    Patient ID: Jodi Saunders, female    DOB: November 29, 1970, 41 y.o.   MRN: [id number]  HPI  Heavy periods- each month.  Not longer or spread out.  Just painful with lots of cramping and heavy.  Wants to discus surgical otpions.  No abd pain now.  No N/V/D/C  Hip pain- some mild anterior pain with bending, squatting.  Occasionally will hear a popping sound.  No weakness, numbness in legs.    Allergies- to peanuts.  Has seen allergist with negative RAST.  Will f/u for further testing.    nonsmoker  Review of Systems See above    Objective:   Physical Exam Vital signs reviewed General appearance - alert, well appearing, and in no distress and oriented to person, place, and time Heart - normal rate, regular rhythm, normal S1, S2, no murmurs, rubs, clicks or gallops Chest - clear to auscultation, no wheezes, rales or rhonchi, symmetric air entry, no tachypnea, retractions or cyanosis Abdomen - soft, nontender, nondistended, no masses or organomegaly Bilateral hips with good ROM without pain.  Left hip limited internal rotation but otherwise symmetric.       Assessment & Plan:

## 2011-08-29 ENCOUNTER — Encounter: Payer: Self-pay | Admitting: Obstetrics and Gynecology

## 2011-08-30 ENCOUNTER — Other Ambulatory Visit: Payer: Self-pay | Admitting: Family Medicine

## 2011-08-30 DIAGNOSIS — Z1231 Encounter for screening mammogram for malignant neoplasm of breast: Secondary | ICD-10-CM

## 2011-09-04 ENCOUNTER — Telehealth: Payer: Self-pay | Admitting: Family Medicine

## 2011-09-04 DIAGNOSIS — M25559 Pain in unspecified hip: Secondary | ICD-10-CM

## 2011-09-04 NOTE — Telephone Encounter (Signed)
Please call patient and let her know that her hip x-rays did not show any arthritis or anything else. I think she would benefit from evaluation at sports medicine. I have put in a referral

## 2011-09-04 NOTE — Telephone Encounter (Signed)
No answer and no machine.  Will await callback. Avry Roedl Dawn  

## 2011-09-06 ENCOUNTER — Ambulatory Visit (INDEPENDENT_AMBULATORY_CARE_PROVIDER_SITE_OTHER): Payer: BC Managed Care – PPO | Admitting: Family Medicine

## 2011-09-06 VITALS — BP 133/89

## 2011-09-06 DIAGNOSIS — M25559 Pain in unspecified hip: Secondary | ICD-10-CM

## 2011-09-06 NOTE — Assessment & Plan Note (Signed)
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  Her symptoms are not very bothersome at this point so we will try to manage her conservatively with a HEP.  Offered formal physical therapy but her time schedule will not allow.   If she continues to have problems we will get an MRI with plans on proceeding to more aggressive treatment.

## 2011-09-06 NOTE — Patient Instructions (Signed)
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 There are multiple causes of snapping hip syndrome, some involve soft tissue (tendon or ligament) passing over and being caught on a bony prominence. It may also be caused by loose pieces of bone or cartilage that are present in the hip joint that become caught on soft tissues. SYMPTOMS    "Snapping" sensation in the hip that is either felt and/or heard.   Pain, tenderness, and/or inflammation of the outer hip (iliotibial (IT) band).  CAUSES   Snapping hip syndrome is caused by a mechanical disruption in the hip. Common mechanisms of injury include:  Tendons snapping off bony prominences. For example the iliotibial (IT) band snapping over the greater trochanter of the hip.   Loose fragments (bone or cartilage) being caught in the hip joint.  RISK INCREASES WITH:  Contact sports (football, hockey, or soccer).   Improperly fitted or padded protective equipment.   Endurance sports (distance running, triathlon, race walking).   Activities that require bending, lifting, or climbing.   Poor strength and flexibility.   Failure to warm-up properly before activity.   Flat feet.   Improper knee alignment (knock knees or bowlegged).   Compensation for other extremity injuries.  PREVENTION  Warm up and stretch properly before activity.   Allow for adequate recovery between workouts.   Maintain physical fitness:   Strength, flexibility, and endurance.   Cardiovascular fitness.   Learn and use proper technique. When possible, have a coach correct improper technique.   Wear properly fitted and padded protective equipment.   Wear arch supports (orthotics) if you have flat feet.  PROGNOSIS   If treated properly, then snapping hip syndrome usually resolves within 2 to 6 weeks.   RELATED COMPLICATIONS    Prolonged healing time, if improperly treated or  re-injured.   Recurrent symptoms that result in a chronic problem.  TREATMENT Treatment initially involves the use of ice and medication to help reduce pain and inflammation. The use of strengthening and stretching exercises may help reduce pain with activity. These exercises may be performed at home or with referral to a therapist. If you have flat feet, then your caregiver may recommend that you wear arch supports. Your caregiver may recommend corticosteroid injections to help reduce inflammation that may be causing the mechanical problems in the hip. If symptoms persist for greater than 6 months despite non-surgical (conservative) treatment, then surgery may be recommended. MEDICATION    If pain medication is necessary, then nonsteroidal anti-inflammatory medications, such as aspirin and ibuprofen, or other minor pain relievers, such as acetaminophen, are often recommended.   Do not take pain medication for 7 days before surgery.   Prescription pain relievers may be given if deemed necessary by your caregiver. Use only as directed and only as much as you need.   Corticosteroid injections may be given by your caregiver. These injections should be reserved for the most serious cases, because they may only be given a certain number of times.  HEAT AND COLD  Cold treatment (icing) relieves pain and reduces inflammation. Cold treatment should be applied for 10 to 15 minutes every 2 to 3 hours for inflammation and pain and immediately after any activity that aggravates your symptoms. Use ice packs or massage the area with a piece of ice (ice massage).   Heat treatment may be used prior to performing the stretching and strengthening activities prescribed by your caregiver,  physical therapist, or athletic trainer. Use a heat pack or soak your injury in warm water.  SEEK MEDICAL CARE IF:  Treatment seems to offer no benefit, or the condition worsens.   Any medications produce adverse side effects.    EXERCISES RANGE OF MOTION (ROM) AND STRETCHING EXERCISES - Snapping Hip Syndrome These exercises may help you when beginning to rehabilitate your injury. Your symptoms may resolve with or without further involvement from your physician, physical therapist or athletic trainer. While completing these exercises, remember:    Restoring tissue flexibility helps normal motion to return to the joints. This allows healthier, less painful movement and activity.   An effective stretch should be held for at least 30 seconds.   A stretch should never be painful. You should only feel a gentle lengthening or release in the stretched tissue.  STRETCH - Hip Rotators  Lie on your back on a firm surface. Grasp your right / left knee with your right / left hand and your ankle with your opposite hand.   Keeping your hips and shoulders firmly planted, gently pull your right / left knee and rotate your lower leg toward your opposite shoulder until you feel a stretch in your buttocks.   Hold this stretch for 5-10 seconds.  Repeat this stretch 3 times. Complete this stretch 1-2 times per day. STRETCH - Iliotibial Band   On the floor or bed, lie on your side so your right / left leg is on top. Bend your knee and grab your ankle.   Slowly bring your knee back so that your thigh is in line with your trunk. Keep your heel at your buttocks and gently arch your back so your head, shoulders and hips line up.   Slowly lower your leg so that your knee approaches the floor/bed until you feel a gentle stretch on the outside of your right / left thigh. If you do not feel a stretch and your knee will not fall farther, place the heel of your opposite foot on top of your knee and pull your thigh down farther.   Hold this stretch for 5-10 seconds.  Repeat 3 times. Complete this exercise 1-2 times per day. STRETCH - Hip Adductors, Sitting  Sit on the floor and place the bottoms of your feet together. Keep your chest up and  look straight ahead to keep your back in proper alignment. Slide your feet in towards your body as far as you can without rounding your back or increasing any discomfort.   Gently push down on your knees until you feel a gentle stretch in your inner thighs. Hold this position for 5-10 seconds.  Repeat 3 times. Complete this exercise 1-2 times per day.   STRETCHING - Hip Flexors, Lunge  Half kneel with your right / left knee on the floor and your opposite knee bent and directly over your ankle.   Keep good posture with your head over your shoulders. Tighten your buttocks to point your tailbone downward; this will prevent your back from arching too much.   You should feel a gentle stretch in the front of your right / left thigh and/or hip. If you do not feel any resistance, slightly slide your opposite foot forward and then slowly lunge forward so your knee once again lines up over your ankle. Be sure your tailbone remains pointed downward.   Hold this stretch for 5-10 seconds.  Repeat 3 times. Complete this stretch 1-2 times per day. STRENGTHENING EXERCISES - Snapping  Hip Syndrome These exercises may help you when beginning to rehabilitate your injury. They may resolve your symptoms with or without further involvement from your physician, physical therapist or athletic trainer. While completing these exercises, remember:    Muscles can gain both the endurance and the strength needed for everyday activities through controlled exercises.   Complete these exercises as instructed by your physician, physical therapist or athletic trainer. Progress with the resistance and repetition exercises only as your caregiver advises.   You may experience muscle soreness or fatigue, but the pain or discomfort you are trying to eliminate should never worsen during these exercises. If this pain does worsen, stop and make certain you are following the directions exactly. If the pain is still present after  adjustments, discontinue the exercise until you can discuss the trouble with your clinician.  STRENGTH - Hip Abductors, Straight Leg Raises Be aware of your form throughout the entire exercise so that you exercise the correct muscles. Sloppy form means that you are not strengthening the correct muscles.  Lie on your side so that your head, shoulders, knee and hip line up. You may bend your lower knee to help maintain your balance. Your right / left leg should be on top.   Roll your hips slightly forward, so that your hips are stacked directly over each other and your right / left knee is facing forward.   Lift your top leg up 4-6 inches, leading with your heel. Be sure that your foot does not drift forward or that your knee does not roll toward the ceiling.   Slowly lower your leg to the starting position. Allow the muscles to fully relax before beginning the next repetition.  Repeat 10 times. Complete this exercise once daily.   STRENGTH - Hip Abductors, Quadriped  On a firm, lightly padded surface, position yourself on your hands and knees. Your hands should be directly below your shoulders and your knees should be directly below your hips.   Keeping your right / left knee bent, lift your leg out to the side. Keep your legs level and in line with your shoulders.   Position yourself on your hands and knees.   Keeping your trunk steady and your hips level, slowly lower your leg to the starting position.  Repeat 10 times. Complete this exercise once daily.    Document Released: 06/05/2005 Document Revised: 05/25/2011 Document Reviewed: 09/17/2008 [facility] Patient Information 2012 [place], [place].

## 2011-09-06 NOTE — Progress Notes (Signed)
  Subjective:    Patient ID: Jodi Saunders, female    DOB: 1971/06/15, 41 y.o.   MRN: [id number]  HPI 41 y/o female is here for left hip popping and catching for about a year.  She has some mild pain associated with this.  She has no known injury.  However, when she first noticed the symptoms she was doing boot camp 3x weekly.  She notices that the symptoms are increased with yoga class or during her menses.  She doesn't have symptoms during normal daily activity.  She has no back pain, no radiating pain down her legs, and no numbness or weakness of the legs.   Review of Systems     Objective:   Physical Exam  Left hip: Mild tenderness to palpation over the hip flexor No pain with external rotation Mild pain with internal rotation No popping but some minor catching with extension Flexion is normal and pain free Quad and abductor strength are decreased bilaterally      Assessment & Plan:

## 2011-09-07 ENCOUNTER — Ambulatory Visit
Admission: RE | Admit: 2011-09-07 | Discharge: 2011-09-07 | Disposition: A | Discharge status: Home / Self Care | Payer: BC Managed Care – PPO | Source: Ambulatory Visit | Attending: Family Medicine | Admitting: Family Medicine

## 2011-09-07 ENCOUNTER — Other Ambulatory Visit: Payer: Self-pay | Admitting: Family Medicine

## 2011-09-07 DIAGNOSIS — Z1231 Encounter for screening mammogram for malignant neoplasm of breast: Secondary | ICD-10-CM

## 2011-10-11 ENCOUNTER — Other Ambulatory Visit (HOSPITAL_COMMUNITY)
Admission: RE | Admit: 2011-10-11 | Discharge: 2011-10-11 | Disposition: A | Discharge status: Home / Self Care | Payer: BC Managed Care – PPO | Source: Ambulatory Visit | Attending: Obstetrics and Gynecology | Admitting: Obstetrics and Gynecology

## 2011-10-11 ENCOUNTER — Ambulatory Visit (INDEPENDENT_AMBULATORY_CARE_PROVIDER_SITE_OTHER): Payer: BC Managed Care – PPO | Admitting: Obstetrics and Gynecology

## 2011-10-11 ENCOUNTER — Encounter: Payer: Self-pay | Admitting: Obstetrics and Gynecology

## 2011-10-11 VITALS — BP 122/85 | HR 77 | Temp 98.1°F | Resp 20 | Ht 64.25 in | Wt 252.0 lb

## 2011-10-11 DIAGNOSIS — Z01812 Encounter for preprocedural laboratory examination: Secondary | ICD-10-CM

## 2011-10-11 DIAGNOSIS — G8918 Other acute postprocedural pain: Secondary | ICD-10-CM

## 2011-10-11 DIAGNOSIS — N946 Dysmenorrhea, unspecified: Secondary | ICD-10-CM | POA: Insufficient documentation

## 2011-10-11 DIAGNOSIS — N920 Excessive and frequent menstruation with regular cycle: Secondary | ICD-10-CM

## 2011-10-11 MED ORDER — IBUPROFEN 200 MG PO TABS
600.0000 mg | ORAL_TABLET | Freq: Once | ORAL | Status: AC
  Administered 2011-10-11: 600 mg via ORAL

## 2011-10-11 MED ORDER — IBUPROFEN 200 MG PO TABS: 600.0000 mg | ORAL_TABLET | Freq: Four times a day (QID) | ORAL | Status: DC | PRN

## 2011-10-11 NOTE — Progress Notes (Signed)
  Subjective:    Patient ID: Jodi Saunders, female    DOB: June 18, 1971, 41 y.o.   MRN: [id number]  HPI 41 yo G3P1021 with LMP 09/30/2011 and BMI 42 presenting today as a referral from [facility] for evaluation of abnormal uterine bleeding. Patient describes her menses as occuring monthly and lasting 5-7 days. Patient states her periods are heavy with passage of clots and associated with severe cramping pains. Patient denies chest pain, SOB, lightheadedness or dizziness. Patient states that her periods have been like this for as long as she remembers. She denies ever receiving a blood transfusion. Patient presenting today to discuss treatment options  Past Medical History  Diagnosis Date  . Allergy    Past Surgical History  Procedure Date  . Cholecystectomy 2011  . Tonsillectomy 2008  . Multiple tooth extractions   . Wisdom tooth extraction   . Orif radius & ulna fractures 2002   Family History  Problem Relation Age of Onset  . Diabetes Father   . Hypertension Father   . Diabetes Paternal Grandmother   . Hypertension Paternal Grandmother   . Diabetes Paternal Grandfather   . Hypertension Paternal Grandfather    History  Substance Use Topics  . Smoking status: Never Smoker   . Smokeless tobacco: Never Used  . Alcohol Use: Yes     occasionally      Review of Systems  All other systems reviewed and are negative.       Objective:   Physical Exam GENERAL: Well-developed, well-nourished female in no acute distress.  ABDOMEN: Soft, nontender, nondistended. No organomegaly. obese PELVIC: Normal external female genitalia. Vagina is pink and rugated.  Normal discharge. Normal appearing cervix. Bimanual limited due to body habitus but uterus appears to be normal in size. No adnexal mass or tenderness. EXTREMITIES: No cyanosis, clubbing, or edema, 2+ distal pulses.     Assessment & Plan:  41 yo G3P1021 with menorrhagia and dysmenorrhea - Endometrial biospy was performed   ENDOMETRIAL  BIOPSY     The indications for endometrial biopsy were reviewed.   Risks of the biopsy including cramping, bleeding, infection, uterine perforation, inadequate specimen and need for additional procedures  were discussed. The patient states she understands and agrees to undergo procedure today. Consent was signed. Time out was performed. Urine HCG was negative. A sterile speculum was placed in the patient's vagina and the cervix was prepped with Betadine. A single-toothed tenaculum was placed on the anterior lip of the cervix to stabilize it. The uterine cavity was sounded to a depth of 7 cm using the uterine sound. The 3 mm pipelle was introduced into the endometrial cavity without difficulty, 2 passes were made.  A  moderate amount of tissue was  sent to pathology. The instruments were removed from the patient's vagina. Minimal bleeding from the cervix was noted. The patient tolerated the procedure well.  Routine post-procedure instructions were given to the patient. The patient will follow up in two weeks to review the results and for further management.   - Pelvic ultrasound will be ordered - Treatment options with OCP, Mirena IUD, endometrial ablation were discussed. Patient was well informed on the endometrial ablation and desire to try with OCP first. Patient has used OCP in the past without any issues. No personal or family h/o VTE/stroke. Rx provided and patient instructed to start pack on first day of period. - RTC in 3-4 months for follow-up and further management if necessary.

## 2011-10-11 NOTE — Patient Instructions (Signed)
Menorrhagia Dysfunctional uterine bleeding is different from a normal menstrual period. When periods are heavy or there is more bleeding than is usual for you, it is called menorrhagia. It may be caused by hormonal imbalance, or physical, metabolic, or other problems. Examination is necessary in order that your caregiver may treat treatable causes. If this is a continuing problem, a D&C may be needed. That means that the cervix (the opening of the uterus or womb) is dilated (stretched larger) and the lining of the uterus is scraped out. The tissue scraped out is then examined under a microscope by a specialist (pathologist) to make sure there is nothing of concern that needs further or more extensive treatment. HOME CARE INSTRUCTIONS   If medications were prescribed, take exactly as directed. Do not change or switch medications without consulting your caregiver.   Long term heavy bleeding may result in iron deficiency. Your caregiver may have prescribed iron pills. They help replace the iron your body lost from heavy bleeding. Take exactly as directed. Iron may cause constipation. If this becomes a problem, increase the bran, fruits, and roughage in your diet.   Do not take aspirin or medicines that contain aspirin one week before or during your menstrual period. Aspirin may make the bleeding worse.   If you need to change your sanitary pad or tampon more than once every 2 hours, stay in bed and rest as much as possible until the bleeding stops.   Eat well-balanced meals. Eat foods high in iron. Examples are leafy green vegetables, meat, liver, eggs, and whole grain breads and cereals. Do not try to lose weight until the abnormal bleeding has stopped and your blood iron level is back to normal.  SEEK MEDICAL CARE IF:   You need to change your sanitary pad or tampon more than once an hour.   You develop nausea (feeling sick to your stomach) and vomiting, dizziness, or diarrhea while you are taking  your medicine.   You have any problems that may be related to the medicine you are taking.  SEEK IMMEDIATE MEDICAL CARE IF:   You have a fever.   You develop chills.   You develop severe bleeding or start to pass blood clots.   You feel dizzy or faint.  MAKE SURE YOU:   Understand these instructions.   Will watch your condition.   Will get help right away if you are not doing well or get worse.  Document Released: 06/05/2005 Document Revised: 05/25/2011 Document Reviewed: 01/24/2008 [facility] Patient Information 2012 [place], [place].              Levonorgestrel intrauterine device (IUD) What is this medicine? LEVONORGESTREL IUD (LEE voe nor jes trel) is a contraceptive (birth control) device. It is used to prevent pregnancy and to treat heavy bleeding that occurs during your period. It can be used for up to 5 years. This medicine may be used for other purposes; ask your health care provider or pharmacist if you have questions. What should I tell my health care provider before I take this medicine? They need to know if you have any of these conditions: -abnormal Pap smear -cancer of the breast, uterus, or cervix -diabetes -endometritis -genital or pelvic infection now or in the past -have more than one sexual partner or your partner has more than one partner -heart disease -history of an ectopic or tubal pregnancy -immune system problems -IUD in place -liver disease or tumor -problems with blood clots or take blood-thinners -use  intravenous drugs -uterus of unusual shape -vaginal bleeding that has not been explained -an unusual or allergic reaction to levonorgestrel, other hormones, silicone, or polyethylene, medicines, foods, dyes, or preservatives -pregnant or trying to get pregnant -breast-feeding How should I use this medicine? This device is placed inside the uterus by a health care professional. Talk to your pediatrician regarding the use of this medicine in  children. Special care may be needed. Overdosage: If you think you have taken too much of this medicine contact a poison control center or emergency room at once. NOTE: This medicine is only for you. Do not share this medicine with others. What if I miss a dose? This does not apply. What may interact with this medicine? Do not take this medicine with any of the following medications: -amprenavir -bosentan -fosamprenavir This medicine may also interact with the following medications: -aprepitant -barbiturate medicines for inducing sleep or treating seizures -bexarotene -griseofulvin -medicines to treat seizures like carbamazepine, ethotoin, felbamate, oxcarbazepine, phenytoin, topiramate -modafinil -pioglitazone -rifabutin -rifampin -rifapentine -some medicines to treat HIV infection like atazanavir, indinavir, lopinavir, nelfinavir, tipranavir, ritonavir -St. John's wort -warfarin This list may not describe all possible interactions. Give your health care provider a list of all the medicines, herbs, non-prescription drugs, or dietary supplements you use. Also tell them if you smoke, drink alcohol, or use illegal drugs. Some items may interact with your medicine. What should I watch for while using this medicine? Visit your doctor or health care professional for regular check ups. See your doctor if you or your partner has sexual contact with others, becomes HIV positive, or gets a sexual transmitted disease. This product does not protect you against HIV infection (AIDS) or other sexually transmitted diseases. You can check the placement of the IUD yourself by reaching up to the top of your vagina with clean fingers to feel the threads. Do not pull on the threads. It is a good habit to check placement after each menstrual period. Call your doctor right away if you feel more of the IUD than just the threads or if you cannot feel the threads at all. The IUD may come out by itself. You may  become pregnant if the device comes out. If you notice that the IUD has come out use a backup birth control method like condoms and call your health care provider. Using tampons will not change the position of the IUD and are okay to use during your period. What side effects may I notice from receiving this medicine? Side effects that you should report to your doctor or health care professional as soon as possible: -allergic reactions like skin rash, itching or hives, swelling of the face, lips, or tongue -fever, flu-like symptoms -genital sores -high blood pressure -no menstrual period for 6 weeks during use -pain, swelling, warmth in the leg -pelvic pain or tenderness -severe or sudden headache -signs of pregnancy -stomach cramping -sudden shortness of breath -trouble with balance, talking, or walking -unusual vaginal bleeding, discharge -yellowing of the eyes or skin Side effects that usually do not require medical attention (report to your doctor or health care professional if they continue or are bothersome): -acne -breast pain -change in sex drive or performance -changes in weight -cramping, dizziness, or faintness while the device is being inserted -headache -irregular menstrual bleeding within first 3 to 6 months of use -nausea This list may not describe all possible side effects. Call your doctor for medical advice about side effects. You may report side effects to  FDA at 1-800-FDA-1088. Where should I keep my medicine? This does not apply. NOTE: This sheet is a summary. It may not cover all possible information. If you have questions about this medicine, talk to your doctor, pharmacist, or health care provider.  2012, [employer] Standard. (06/26/2008 6:39:08 PM)  Endometrial Ablation Endometrial ablation removes the lining of the uterus (endometrium). It is usually a same day, outpatient treatment. Ablation helps avoid major surgery (such as a hysterectomy). A  hysterectomy is removal of the cervix and uterus. Endometrial ablation has less risk and complications, has a shorter recovery period and is less expensive. After endometrial ablation, most women will have little or no menstrual bleeding. You may not keep your fertility. Pregnancy is no longer likely after this procedure but if you are pre-menopausal, you still need to use a reliable method of birth control following the procedure because pregnancy can occur. REASONS TO HAVE THE PROCEDURE MAY INCLUDE:  Heavy periods.   Bleeding that is causing anemia.   Anovulatory bleeding, very irregular, bleeding.   Bleeding submucous fibroids (on the lining inside the uterus) if they are smaller than 3 centimeters.  REASONS NOT TO HAVE THE PROCEDURE MAY INCLUDE:  You wish to have more children.   You have a pre-cancerous or cancerous problem. The cause of any abnormal bleeding must be diagnosed before having the procedure.   You have pain coming from the uterus.   You have a submucus fibroid larger than 3 centimeters.   You recently had a baby.   You recently had an infection in the uterus.   You have a severe retro-flexed, tipped uterus and cannot insert the instrument to do the ablation.   You had a Cesarean section or deep major surgery on the uterus.   The inner cavity of the uterus is too large for the endometrial ablation instrument.  RISKS AND COMPLICATIONS   Perforation of the uterus.   Bleeding.   Infection of the uterus, bladder or vagina.   Injury to surrounding organs.   Cutting the cervix.   An air bubble to the lung (air embolus).   Pregnancy following the procedure.   Failure of the procedure to help the problem requiring hysterectomy.   Decreased ability to diagnose cancer in the lining of the uterus.  BEFORE THE PROCEDURE  The lining of the uterus must be tested to make sure there is no pre-cancerous or cancer cells present.   Medications may be given to make  the lining of the uterus thinner.   Ultrasound may be used to evaluate the size and look for abnormalities of the uterus.   Future pregnancy is not desired.  PROCEDURE  There are different ways to destroy the lining of the uterus.   Resectoscope - radio frequency-alternating electric current is the most common one used.   Cryotherapy - freezing the lining of the uterus.   Heated Free Liquid - heated salt (saline) solution inserted into the uterus.   Microwave - uses high energy microwaves in the uterus.   Thermal Balloon - a catheter with a balloon tip is inserted into the uterus and filled with heated fluid.  Your caregiver will talk with you about the method used in this clinic. They will also instruct you on the pros and cons of the procedure. Endometrial ablation is performed along with a procedure called operative hysteroscopy. A narrow viewing tube is inserted through the birth canal (vagina) and through the cervix into the uterus. A tiny camera attached to  the viewing tube (hysteroscope) allows the uterine cavity to be shown on a TV monitor during surgery. Your uterus is filled with a harmless liquid to make the procedure easier. The lining of the uterus is then removed. The lining can also be removed with a resectoscope which allows your surgeon to cut away the lining of the uterus under direct vision. Usually, you will be able to go home within an hour after the procedure. HOME CARE INSTRUCTIONS   Do not drive for 24 hours.   No tampons, douching or intercourse for 2 weeks or until your caregiver approves.   Rest at home for 24 to 48 hours. You may then resume normal activities unless told differently by your caregiver.   Take your temperature two times a day for 4 days, and record it.   Take any medications your caregiver has ordered, as directed.   Use some form of contraception if you are pre-menopausal and do not want to get pregnant.  Bleeding after the procedure is  normal. It varies from light spotting and mildly watery to bloody discharge for 4 to 6 weeks. You may also have mild cramping. Only take over-the-counter or prescription medicines for pain, discomfort, or fever as directed by your caregiver. Do not use aspirin, as this may aggravate bleeding. Frequent urination during the first 24 hours is normal. You will not know how effective your surgery is until at least 3 months after the surgery. SEEK IMMEDIATE MEDICAL CARE IF:   Bleeding is heavier than a normal menstrual cycle.   An oral temperature above 102 F (38.9 C) develops.   You have increasing cramps or pains not relieved with medication or develop belly (abdominal) pain which does not seem to be related to the same area of earlier cramping and pain.   You are light headed, weak or have fainting episodes.   You develop pain in the shoulder strap areas.   You have chest or leg pain.   You have abnormal vaginal discharge.   You have painful urination.  Document Released: 04/14/2004 Document Revised: 05/25/2011 Document Reviewed: 07/13/2007 [facility] Patient Information 2012 [place], [place].

## 2011-10-12 ENCOUNTER — Telehealth: Payer: Self-pay | Admitting: Obstetrics and Gynecology

## 2011-10-12 MED ORDER — NORETHINDRONE-ETH ESTRADIOL 1-35 MG-MCG PO TABS: 1.0000 | ORAL_TABLET | Freq: Every day | ORAL | Status: DC

## 2011-10-12 NOTE — Telephone Encounter (Signed)
Patient called requesting a call back in regard of her Rx that was written from yesterday's visit.

## 2011-10-12 NOTE — Telephone Encounter (Signed)
Called pt and was unable to leave message due to phone kept ringing.  

## 2011-10-12 NOTE — Progress Notes (Signed)
Addended by: Catalina Antigua on: 10/12/2011 11:22 AM   Modules accepted: Orders

## 2011-10-13 ENCOUNTER — Ambulatory Visit (HOSPITAL_COMMUNITY)
Admission: RE | Admit: 2011-10-13 | Discharge: 2011-10-13 | Disposition: A | Discharge status: Home / Self Care | Payer: BC Managed Care – PPO | Source: Ambulatory Visit | Attending: Obstetrics and Gynecology | Admitting: Obstetrics and Gynecology

## 2011-10-13 DIAGNOSIS — N946 Dysmenorrhea, unspecified: Secondary | ICD-10-CM | POA: Insufficient documentation

## 2011-10-13 DIAGNOSIS — D649 Anemia, unspecified: Secondary | ICD-10-CM | POA: Insufficient documentation

## 2011-10-13 DIAGNOSIS — N920 Excessive and frequent menstruation with regular cycle: Secondary | ICD-10-CM | POA: Insufficient documentation

## 2011-10-19 NOTE — Telephone Encounter (Signed)
Called patient back and states that her question about Rx has been fixed by a nurse, she called here at the clinic, although not documented, patient states that she's fine now and all was taken care of. No further assistance needed.

## 2012-02-08 ENCOUNTER — Ambulatory Visit (INDEPENDENT_AMBULATORY_CARE_PROVIDER_SITE_OTHER): Payer: BC Managed Care – PPO | Admitting: Obstetrics and Gynecology

## 2012-02-08 VITALS — BP 133/87 | HR 88 | Temp 97.9°F | Ht 64.25 in | Wt 251.3 lb

## 2012-02-08 DIAGNOSIS — N946 Dysmenorrhea, unspecified: Secondary | ICD-10-CM

## 2012-02-08 DIAGNOSIS — N920 Excessive and frequent menstruation with regular cycle: Secondary | ICD-10-CM

## 2012-02-08 NOTE — Progress Notes (Signed)
Patient presents today for follow-up on her menorrhagia which has been medically managed with OCP for the past 3 months. Patient reports since the initiation of OCP her menses has been less heavy, lasting 2-3 days and less painful. However, she states that she does not like the way the birth control is making her feels. She describes a body odor that was not present prior to the initiation of OCP, as well as persistent breast tenderness. Overall she is happy with the results but would like to move forward with surgery. Patient desires a laparoscopic hysterectomy as she has had an uncomplicated laparoscopic cholecystectomy.  Discussed with the patient, risks and benefits af laparoscopic supracervical hysterectomy with bilateral salpingectomy. Patient was made aware of April 2013 ultrasound results which demonstrated an enlarged right fallopian tube. Risks discussed included but was not limited to risk of bleeding, infection , damage to adjacent organs (bladder, ureter, bowel). Patient verbalized understanding and all questions were answered. Patient is expected to have time off in December and wishes to have the surgery scheduled at that time. Patient is planning on returning to the clinic in early November to schedule the surgery, as she would like to think about it a little more.  Patient plans to continue with OCP until her next appointment.

## 2012-03-14 ENCOUNTER — Other Ambulatory Visit: Payer: Self-pay | Admitting: Family Medicine

## 2012-04-14 ENCOUNTER — Encounter (HOSPITAL_BASED_OUTPATIENT_CLINIC_OR_DEPARTMENT_OTHER): Payer: Self-pay | Admitting: *Deleted

## 2012-04-14 ENCOUNTER — Emergency Department (HOSPITAL_BASED_OUTPATIENT_CLINIC_OR_DEPARTMENT_OTHER)
Admission: EM | Admit: 2012-04-14 | Discharge: 2012-04-14 | Disposition: A | Discharge status: Home / Self Care | Payer: BC Managed Care – PPO | Attending: Emergency Medicine | Admitting: Emergency Medicine

## 2012-04-14 DIAGNOSIS — Z882 Allergy status to sulfonamides status: Secondary | ICD-10-CM | POA: Insufficient documentation

## 2012-04-14 DIAGNOSIS — Z9889 Other specified postprocedural states: Secondary | ICD-10-CM | POA: Insufficient documentation

## 2012-04-14 DIAGNOSIS — M62830 Muscle spasm of back: Secondary | ICD-10-CM

## 2012-04-14 DIAGNOSIS — Z79899 Other long term (current) drug therapy: Secondary | ICD-10-CM | POA: Insufficient documentation

## 2012-04-14 DIAGNOSIS — M5380 Other specified dorsopathies, site unspecified: Secondary | ICD-10-CM | POA: Insufficient documentation

## 2012-04-14 HISTORY — DX: Allergy status to unspecified drugs, medicaments and biological substances: Z88.9

## 2012-04-14 MED ORDER — IBUPROFEN 600 MG PO TABS: 600.0000 mg | ORAL_TABLET | Freq: Four times a day (QID) | ORAL | Status: DC | PRN

## 2012-04-14 MED ORDER — OXYCODONE-ACETAMINOPHEN 5-325 MG PO TABS
1.0000 | ORAL_TABLET | Freq: Once | ORAL | Status: AC
  Administered 2012-04-14: 1 via ORAL
  Filled 2012-04-14 (×2): qty 1

## 2012-04-14 MED ORDER — CYCLOBENZAPRINE HCL 5 MG PO TABS: 5.0000 mg | ORAL_TABLET | Freq: Three times a day (TID) | ORAL | Status: DC | PRN

## 2012-04-14 MED ORDER — OXYCODONE-ACETAMINOPHEN 5-325 MG PO TABS: 1.0000 | ORAL_TABLET | Freq: Four times a day (QID) | ORAL | Status: DC | PRN

## 2012-04-14 MED ORDER — DIAZEPAM 5 MG PO TABS
5.0000 mg | ORAL_TABLET | Freq: Once | ORAL | Status: AC
  Administered 2012-04-14: 5 mg via ORAL
  Filled 2012-04-14: qty 1

## 2012-04-14 MED ORDER — IBUPROFEN 800 MG PO TABS
800.0000 mg | ORAL_TABLET | Freq: Once | ORAL | Status: AC
  Administered 2012-04-14: 800 mg via ORAL
  Filled 2012-04-14: qty 1

## 2012-04-14 NOTE — ED Notes (Signed)
Pt reports back pain x 2 weeks after packing and moving- pain getting progressively worse per pt report- pt taking ibuprofen without relief

## 2012-04-14 NOTE — ED Provider Notes (Signed)
History   This chart was scribed for Jodi Chick, MD by Jodi Saunders. The patient was seen in room MH08/MH08 and the patient's care was started at 4:15PM.     CSN: [id number]  Arrival date & time 04/14/12  1551   First MD Initiated Contact with Patient 04/14/12 1615      Chief Complaint  Patient presents with  . Back Pain    (Consider location/radiation/quality/duration/timing/severity/associated sxs/prior treatment) Patient is a 41 y.o. female presenting with back pain. The history is provided by the patient. No language interpreter was used.  Back Pain  This is a new problem. The current episode started more than 1 week ago (two weeks ago (14 days)). The problem occurs constantly. The problem has been gradually worsening. The pain is associated with no known injury. The pain is present in the lumbar spine. The pain does not radiate. The pain is moderate. The symptoms are aggravated by certain positions. The pain is worse during the night. Stiffness is present in the morning. She has tried NSAIDs for the symptoms. The treatment provided no relief.    Jodi Saunders is a 41 y.o. female who presents to the Emergency Department complaining of  gradual, progressively worsening, back pain located at the bilateral lower back, onset two weeks ago. The pt reports her back pain is extremely painful upon waking up in the morning, due to lying down in certain positions overnight. Modifying factors include certain movements and positions which intensify the back pain. The pt has been taking Ibuprofen (last application was Friday afternoon, 04/12/12), which does not provide relief of the back pain. The pt has a hx of cholecystectomy and allergy to sulfonamide derivatives.  The pt denies weakness in the legs, dysuria, and any trauma associated with the back pain.   The pt does not smoke , however, she drinks alcohol on occasion.   PCP is Dr. Armen Pickup at [facility].    Past Medical  History  Diagnosis Date  . Allergy   . Multiple allergies     Past Surgical History  Procedure Date  . Cholecystectomy 2011  . Tonsillectomy 2008  . Multiple tooth extractions   . Wisdom tooth extraction   . Orif radius & ulna fractures 2002    Family History  Problem Relation Age of Onset  . Diabetes Father   . Hypertension Father   . Diabetes Paternal Grandmother   . Hypertension Paternal Grandmother   . Diabetes Paternal Grandfather   . Hypertension Paternal Grandfather     History  Substance Use Topics  . Smoking status: Never Smoker   . Smokeless tobacco: Never Used  . Alcohol Use: Yes     occasionally    OB History    Grav Para Term Preterm Abortions TAB SAB Ect Mult Living   3 1 1  2 2    1       Review of Systems  Musculoskeletal: Positive for back pain.  All other systems reviewed and are negative.    Allergies  Peanut-containing drug products; Latex; and Sulfonamide derivatives  Home Medications   Current Outpatient Rx  Name Route Sig Dispense Refill  . IBUPROFEN 200 MG PO TABS Oral Take 800 mg by mouth every 8 (eight) hours as needed.    Marland Kitchen CETIRIZINE HCL 10 MG PO TABS Oral Take 10 mg by mouth daily.    . CYCLOBENZAPRINE HCL 5 MG PO TABS Oral Take 1 tablet (5 mg total) by mouth 3 (three)  times daily as needed for muscle spasms. 20 tablet 0  . EPINEPHRINE 0.3 MG/0.3ML IJ DEVI Intramuscular Inject 0.3 mLs (0.3 mg total) into the muscle once. 1 Device 0  . FERROUS SULFATE 325 (65 FE) MG PO TABS  TAKE 1 TABLET THREE TIMES A DAY 90 tablet 6  . IBUPROFEN 600 MG PO TABS Oral Take 1 tablet (600 mg total) by mouth every 6 (six) hours as needed for pain. 30 tablet 0  . NORETHINDRONE-ETH ESTRADIOL 1-35 MG-MCG PO TABS Oral Take 1 tablet by mouth daily. 1 Package 11  . OXYCODONE-ACETAMINOPHEN 5-325 MG PO TABS Oral Take 1-2 tablets by mouth every 6 (six) hours as needed for pain. 15 tablet 0    BP 138/93  Pulse 82  Temp 98.4 F (36.9 C) (Oral)  Resp 20   Ht 5\' 4"  (1.626 m)  Wt 250 lb (113.399 kg)  BMI 42.91 kg/m2  SpO2 98%  LMP 04/08/2012  Physical Exam  Nursing note and vitals reviewed. Constitutional: She is oriented to person, place, and time. She appears well-developed and well-nourished.  HENT:  Head: Atraumatic.  Nose: Nose normal.  Eyes: Conjunctivae normal and EOM are normal.  Neck: Normal range of motion.  Cardiovascular: Normal rate, regular rhythm and normal heart sounds.   Pulmonary/Chest: Effort normal and breath sounds normal.  Abdominal: Soft. Bowel sounds are normal.  Musculoskeletal: Normal range of motion.       No midline lumbar tenderness, no perispinal tenderness, 5/5 strength, sensation intact. No cva tenderness detected.   Neurological: She is alert and oriented to person, place, and time.  Skin: Skin is warm and dry.  Psychiatric: She has a normal mood and affect. Her behavior is normal.    ED Course  Procedures (including critical care time)  DIAGNOSTIC STUDIES: Oxygen Saturation is 98% on room air, normal by my interpretation.    COORDINATION OF CARE:   5:05 PM- Treatment plan concerning management of lumbar strain (ibuprophen X 3/day, and pain management) discussed with patient. Pt agrees with treatment.      Labs Reviewed - No data to display No results found.   1. Muscle spasm of back       MDM  Pt with low back pain, worse in the morning, worse with movement.  Pain has been ongoing x 10-14 days, no preceding trauma.  No fever, no signs or symtpoms of cauda equina, no midline back tenderness.  Pt started on pain meds, ibuprofen, muscle relaxer.  Discharged with strict return precautions.  Pt agreeable with plan.    I personally performed the services described in this documentation, which was scribed in my presence. The recorded information has been reviewed and considered.    Jodi Chick, MD 04/14/12 [phone]

## 2012-05-10 ENCOUNTER — Encounter (HOSPITAL_COMMUNITY): Payer: Self-pay | Admitting: *Deleted

## 2012-05-10 ENCOUNTER — Emergency Department (HOSPITAL_COMMUNITY)
Admission: EM | Admit: 2012-05-10 | Discharge: 2012-05-10 | Disposition: A | Discharge status: Home / Self Care | Payer: BC Managed Care – PPO | Source: Home / Self Care | Attending: Emergency Medicine | Admitting: Emergency Medicine

## 2012-05-10 DIAGNOSIS — R110 Nausea: Secondary | ICD-10-CM

## 2012-05-10 DIAGNOSIS — R51 Headache: Secondary | ICD-10-CM

## 2012-05-10 LAB — POCT URINALYSIS DIP (DEVICE)
Bilirubin Urine: NEGATIVE
Glucose, UA: NEGATIVE mg/dL
Hgb urine dipstick: NEGATIVE
Specific Gravity, Urine: 1.025 (ref 1.005–1.030)
pH: 6 (ref 5.0–8.0)

## 2012-05-10 MED ORDER — METOCLOPRAMIDE HCL 5 MG/ML IJ SOLN
INTRAMUSCULAR | Status: AC
  Filled 2012-05-10: qty 2

## 2012-05-10 MED ORDER — METOCLOPRAMIDE HCL 5 MG/ML IJ SOLN: 10.0000 mg | Freq: Once | INTRAMUSCULAR | Status: DC

## 2012-05-10 MED ORDER — ONDANSETRON 4 MG PO TBDP
ORAL_TABLET | ORAL | Status: AC
  Filled 2012-05-10: qty 2

## 2012-05-10 MED ORDER — ONDANSETRON HCL 4 MG PO TABS: 4.0000 mg | ORAL_TABLET | Freq: Four times a day (QID) | ORAL | Status: DC

## 2012-05-10 MED ORDER — KETOROLAC TROMETHAMINE 60 MG/2ML IM SOLN
60.0000 mg | Freq: Once | INTRAMUSCULAR | Status: AC
  Administered 2012-05-10: 60 mg via INTRAMUSCULAR

## 2012-05-10 MED ORDER — DIPHENHYDRAMINE HCL 25 MG PO CAPS
25.0000 mg | ORAL_CAPSULE | Freq: Four times a day (QID) | ORAL | Status: DC | PRN
  Administered 2012-05-10: 25 mg via ORAL

## 2012-05-10 MED ORDER — DIPHENHYDRAMINE HCL 25 MG PO CAPS
ORAL_CAPSULE | ORAL | Status: AC
  Filled 2012-05-10: qty 1

## 2012-05-10 MED ORDER — KETOROLAC TROMETHAMINE 60 MG/2ML IM SOLN
INTRAMUSCULAR | Status: AC
  Filled 2012-05-10: qty 2

## 2012-05-10 MED ORDER — METOCLOPRAMIDE HCL 5 MG/ML IJ SOLN
10.0000 mg | Freq: Once | INTRAMUSCULAR | Status: AC
  Administered 2012-05-10: 10 mg via INTRAMUSCULAR

## 2012-05-10 MED ORDER — ONDANSETRON 4 MG PO TBDP
8.0000 mg | ORAL_TABLET | Freq: Once | ORAL | Status: AC
  Administered 2012-05-10: 8 mg via ORAL

## 2012-05-10 NOTE — ED Notes (Signed)
Pt  Reports    Symptoms  Of      Body  Aches   Nausea   And  abd  Cramps   Which  Started  This  Am  At 0400           No  Vomiting   No  Diarrhea        Pt  Reports  The      Pain  Is     For the  Most  Part consistant        She  Has  A  History  Of  Anemia

## 2012-05-10 NOTE — ED Provider Notes (Signed)
History     CSN: [id number]  Arrival date & time 05/10/12  1035   First MD Initiated Contact with Patient 05/10/12 1314      Chief Complaint  Patient presents with  . Nausea    (Consider location/radiation/quality/duration/timing/severity/associated sxs/prior treatment) Patient is a 41 y.o. female presenting with headaches. The history is provided by the patient.  Headache The primary symptoms include headaches and nausea. The symptoms began 2 to 6 hours ago.  Patient complains of a 3 day history of persistent headache. Character:  Throbbing, pressurelike Location: diffuse Aggravating activities:  Standing, walking, light Alleviating activities:  Supine, quiet No history of injury.  Not worst headache of life.  Does not awaken from sleep.  No radiation, dizziness, syncope, LOC, diplopia, loss of vision, aura, photophobia. + nausea, no vomiting.  No extremity weakness, numbness, or paresthesias  Additionally complains of lower abdominal cramping.  States menses is due in 2-3 days.  Denies abnormal vaginal discharge or bleeding.  Not sexually active for many years.   Past Medical History  Diagnosis Date  . Allergy   . Multiple allergies     Past Surgical History  Procedure Date  . Cholecystectomy 2011  . Tonsillectomy 2008  . Multiple tooth extractions   . Wisdom tooth extraction   . Orif radius & ulna fractures 2002    Family History  Problem Relation Age of Onset  . Diabetes Father   . Hypertension Father   . Diabetes Paternal Grandmother   . Hypertension Paternal Grandmother   . Diabetes Paternal Grandfather   . Hypertension Paternal Grandfather     History  Substance Use Topics  . Smoking status: Never Smoker   . Smokeless tobacco: Never Used  . Alcohol Use: Yes     Comment: occasionally    OB History    Grav Para Term Preterm Abortions TAB SAB Ect Mult Living   3 1 1  2 2    1       Review of Systems  Constitutional: Positive for chills.  HENT:  Positive for congestion.   Gastrointestinal: Positive for nausea.  Neurological: Positive for headaches.  All other systems reviewed and are negative.    Allergies  Peanut-containing drug products; Latex; and Sulfonamide derivatives  Home Medications   Current Outpatient Rx  Name  Route  Sig  Dispense  Refill  . CETIRIZINE HCL 10 MG PO TABS   Oral   Take 10 mg by mouth daily.         . CYCLOBENZAPRINE HCL 5 MG PO TABS   Oral   Take 1 tablet (5 mg total) by mouth 3 (three) times daily as needed for muscle spasms.   20 tablet   0   . EPINEPHRINE 0.3 MG/0.3ML IJ DEVI   Intramuscular   Inject 0.3 mLs (0.3 mg total) into the muscle once.   1 Device   0   . FERROUS SULFATE 325 (65 FE) MG PO TABS      TAKE 1 TABLET THREE TIMES A DAY   90 tablet   6   . IBUPROFEN 200 MG PO TABS   Oral   Take 800 mg by mouth every 8 (eight) hours as needed.         . IBUPROFEN 600 MG PO TABS   Oral   Take 1 tablet (600 mg total) by mouth every 6 (six) hours as needed for pain.   30 tablet   0   . NORETHINDRONE-ETH ESTRADIOL 1-35 MG-MCG  PO TABS   Oral   Take 1 tablet by mouth daily.   1 Package   11   . ONDANSETRON HCL 4 MG PO TABS   Oral   Take 1 tablet (4 mg total) by mouth every 6 (six) hours.   12 tablet   0   . OXYCODONE-ACETAMINOPHEN 5-325 MG PO TABS   Oral   Take 1-2 tablets by mouth every 6 (six) hours as needed for pain.   15 tablet   0     BP 154/98  Pulse 88  Temp 100.2 F (37.9 C) (Oral)  Resp 16  SpO2 100%  LMP 04/14/2012  Physical Exam  Nursing note and vitals reviewed. Constitutional: She is oriented to person, place, and time. Vital signs are normal. She appears well-developed and well-nourished. She is active and cooperative.  HENT:  Head: Normocephalic.  Right Ear: External ear normal.  Left Ear: External ear normal.  Mouth/Throat: Oropharynx is clear and moist. No oropharyngeal exudate.  Eyes: Conjunctivae normal and EOM are normal. Pupils  are equal, round, and reactive to light. No scleral icterus.  Neck: Trachea normal and normal range of motion. Neck supple. No thyromegaly present.  Cardiovascular: Normal rate, regular rhythm and normal heart sounds.   Pulmonary/Chest: Effort normal and breath sounds normal.  Abdominal: Soft. Bowel sounds are normal. There is tenderness. There is no CVA tenderness.  Lymphadenopathy:    She has no cervical adenopathy.  Neurological: She is alert and oriented to person, place, and time. She has normal strength. No cranial nerve deficit or sensory deficit. GCS eye subscore is 4. GCS verbal subscore is 5. GCS motor subscore is 6.  Skin: Skin is warm and dry.  Psychiatric: She has a normal mood and affect. Her speech is normal and behavior is normal. Judgment and thought content normal. Cognition and memory are normal.    ED Course  Procedures (including critical care time)   Labs Reviewed  POCT URINALYSIS DIP (DEVICE)  POCT PREGNANCY, URINE  LAB REPORT - SCANNED   No results found.   1. Headache   2. Nausea       MDM  Toradol 60mg  and Zofran 8mg  administered.  1537 abodminal pain improved but headache and nausea persist.  Reglan 10mg  and Benadryl 25mg  administered.  1628 Pt reports improvement in symptoms, states she is ready to go home.   rx zofran.  Increase fluids, rest.  Clear liquid diet then progress as tolerated.  Follow up with primary care provider for further evaluation if symptoms do not improve.          Johnsie Kindred, NP 05/10/12 1518  Johnsie Kindred, NP 05/13/12 [phone]

## 2012-05-13 NOTE — ED Provider Notes (Signed)
Medical screening examination/treatment/procedure(s) were performed by non-physician practitioner and as supervising physician I was immediately available for consultation/collaboration.  Raynald Blend, MD 05/13/12 [phone]

## 2012-05-20 ENCOUNTER — Encounter: Payer: Self-pay | Admitting: Obstetrics and Gynecology

## 2012-05-20 ENCOUNTER — Ambulatory Visit (INDEPENDENT_AMBULATORY_CARE_PROVIDER_SITE_OTHER): Payer: BC Managed Care – PPO | Admitting: Obstetrics and Gynecology

## 2012-05-20 VITALS — BP 154/102 | HR 82 | Temp 97.2°F | Ht 64.0 in | Wt 248.7 lb

## 2012-05-20 DIAGNOSIS — N920 Excessive and frequent menstruation with regular cycle: Secondary | ICD-10-CM

## 2012-05-20 NOTE — Progress Notes (Signed)
Patient ID: Jodi Saunders, female   DOB: 1970-06-21, 41 y.o.   MRN: [id number] 41 yo G3P1021 who returns today ready to schedule surgical intervention for the management of her menorrhagia. Patient reports that her cycles have improved while on OCP but she does not like how they make her feel. She states that her cycles have been reduced to 4 days with 1-2 heavy days. The last time I saw her we discussed endometrial ablation vs hysterectomy. Patient has decided that she would like to avoid the hysterectomy if possible.  Reviewed endometrial ablation with the patient and all questions were answered. Patient will be scheduled for that procedure by surgical scheduler.

## 2012-05-21 ENCOUNTER — Encounter (HOSPITAL_COMMUNITY): Payer: Self-pay | Admitting: *Deleted

## 2012-05-28 ENCOUNTER — Encounter (HOSPITAL_COMMUNITY): Payer: Self-pay | Admitting: Pharmacist

## 2012-06-03 ENCOUNTER — Encounter (HOSPITAL_COMMUNITY): Payer: Self-pay | Admitting: *Deleted

## 2012-06-03 ENCOUNTER — Encounter (HOSPITAL_COMMUNITY): Payer: Self-pay | Admitting: Anesthesiology

## 2012-06-03 ENCOUNTER — Ambulatory Visit (HOSPITAL_COMMUNITY)
Admission: RE | Admit: 2012-06-03 | Discharge: 2012-06-03 | Disposition: A | Discharge status: Home / Self Care | Payer: BC Managed Care – PPO | Source: Ambulatory Visit | Attending: Obstetrics and Gynecology | Admitting: Obstetrics and Gynecology

## 2012-06-03 ENCOUNTER — Encounter (HOSPITAL_COMMUNITY)
Admission: RE | Disposition: A | Discharge status: Home / Self Care | Payer: Self-pay | Source: Ambulatory Visit | Attending: Obstetrics and Gynecology

## 2012-06-03 ENCOUNTER — Ambulatory Visit (HOSPITAL_COMMUNITY): Payer: BC Managed Care – PPO | Admitting: Anesthesiology

## 2012-06-03 DIAGNOSIS — D259 Leiomyoma of uterus, unspecified: Secondary | ICD-10-CM | POA: Insufficient documentation

## 2012-06-03 DIAGNOSIS — N920 Excessive and frequent menstruation with regular cycle: Secondary | ICD-10-CM

## 2012-06-03 HISTORY — PX: HYSTEROSCOPY WITH NOVASURE: SHX5574

## 2012-06-03 HISTORY — DX: Anemia, unspecified: D64.9

## 2012-06-03 LAB — CBC
MCH: 21.4 pg — ABNORMAL LOW (ref 26.0–34.0)
MCHC: 31.6 g/dL (ref 30.0–36.0)
Platelets: 360 10*3/uL (ref 150–400)
RDW: 14.6 % (ref 11.5–15.5)

## 2012-06-03 LAB — PREGNANCY, URINE: Preg Test, Ur: NEGATIVE

## 2012-06-03 SURGERY — HYSTEROSCOPY WITH NOVASURE
Anesthesia: General | Site: Abdomen | Wound class: Clean Contaminated

## 2012-06-03 MED ORDER — LACTATED RINGERS IR SOLN
Status: DC | PRN
  Administered 2012-06-03: 3000 mL

## 2012-06-03 MED ORDER — FENTANYL CITRATE 0.05 MG/ML IJ SOLN
INTRAMUSCULAR | Status: AC
  Administered 2012-06-03: 50 ug via INTRAVENOUS
  Filled 2012-06-03: qty 2

## 2012-06-03 MED ORDER — KETOROLAC TROMETHAMINE 30 MG/ML IJ SOLN: 15.0000 mg | Freq: Once | INTRAMUSCULAR | Status: DC | PRN

## 2012-06-03 MED ORDER — LIDOCAINE HCL (CARDIAC) 20 MG/ML IV SOLN
INTRAVENOUS | Status: AC
  Filled 2012-06-03: qty 5

## 2012-06-03 MED ORDER — DIPHENHYDRAMINE HCL 50 MG/ML IJ SOLN
INTRAMUSCULAR | Status: DC | PRN
  Administered 2012-06-03: 12.5 mg via INTRAVENOUS

## 2012-06-03 MED ORDER — IBUPROFEN 600 MG PO TABS: 600.0000 mg | ORAL_TABLET | Freq: Four times a day (QID) | ORAL | Status: DC | PRN

## 2012-06-03 MED ORDER — DIPHENHYDRAMINE HCL 50 MG/ML IJ SOLN
INTRAMUSCULAR | Status: AC
  Filled 2012-06-03: qty 1

## 2012-06-03 MED ORDER — PROPOFOL 10 MG/ML IV EMUL
INTRAVENOUS | Status: DC | PRN
  Administered 2012-06-03: 180 mg via INTRAVENOUS

## 2012-06-03 MED ORDER — FENTANYL CITRATE 0.05 MG/ML IJ SOLN
25.0000 ug | INTRAMUSCULAR | Status: DC | PRN
  Administered 2012-06-03 (×2): 50 ug via INTRAVENOUS

## 2012-06-03 MED ORDER — DEXAMETHASONE SODIUM PHOSPHATE 10 MG/ML IJ SOLN
INTRAMUSCULAR | Status: AC
  Filled 2012-06-03: qty 1

## 2012-06-03 MED ORDER — MIDAZOLAM HCL 2 MG/2ML IJ SOLN
INTRAMUSCULAR | Status: AC
  Filled 2012-06-03: qty 2

## 2012-06-03 MED ORDER — MIDAZOLAM HCL 5 MG/5ML IJ SOLN
INTRAMUSCULAR | Status: DC | PRN
  Administered 2012-06-03: 2 mg via INTRAVENOUS

## 2012-06-03 MED ORDER — EPHEDRINE SULFATE 50 MG/ML IJ SOLN
INTRAMUSCULAR | Status: DC | PRN
  Administered 2012-06-03: 10 mg via INTRAVENOUS

## 2012-06-03 MED ORDER — ONDANSETRON HCL 4 MG/2ML IJ SOLN
INTRAMUSCULAR | Status: DC | PRN
  Administered 2012-06-03: 4 mg via INTRAVENOUS

## 2012-06-03 MED ORDER — CHLOROPROCAINE HCL 1 % IJ SOLN
INTRAMUSCULAR | Status: DC | PRN
  Administered 2012-06-03: 8 mL

## 2012-06-03 MED ORDER — HYDROCODONE-ACETAMINOPHEN 5-325 MG PO TABS: 1.0000 | ORAL_TABLET | Freq: Four times a day (QID) | ORAL | Status: DC | PRN

## 2012-06-03 MED ORDER — FENTANYL CITRATE 0.05 MG/ML IJ SOLN
INTRAMUSCULAR | Status: AC
  Filled 2012-06-03: qty 5

## 2012-06-03 MED ORDER — KETOROLAC TROMETHAMINE 30 MG/ML IJ SOLN
INTRAMUSCULAR | Status: AC
  Filled 2012-06-03: qty 1

## 2012-06-03 MED ORDER — MEPERIDINE HCL 25 MG/ML IJ SOLN: 6.2500 mg | INTRAMUSCULAR | Status: DC | PRN

## 2012-06-03 MED ORDER — EPHEDRINE 5 MG/ML INJ
INTRAVENOUS | Status: AC
  Filled 2012-06-03: qty 10

## 2012-06-03 MED ORDER — PROPOFOL 10 MG/ML IV EMUL
INTRAVENOUS | Status: AC
  Filled 2012-06-03: qty 20

## 2012-06-03 MED ORDER — LACTATED RINGERS IV SOLN
INTRAVENOUS | Status: DC
  Administered 2012-06-03 (×2): via INTRAVENOUS

## 2012-06-03 MED ORDER — DEXAMETHASONE SODIUM PHOSPHATE 10 MG/ML IJ SOLN
INTRAMUSCULAR | Status: DC | PRN
  Administered 2012-06-03: 10 mg via INTRAVENOUS

## 2012-06-03 MED ORDER — KETOROLAC TROMETHAMINE 30 MG/ML IJ SOLN
INTRAMUSCULAR | Status: DC | PRN
  Administered 2012-06-03: 30 mg via INTRAVENOUS

## 2012-06-03 MED ORDER — ONDANSETRON HCL 4 MG/2ML IJ SOLN
INTRAMUSCULAR | Status: AC
  Filled 2012-06-03: qty 2

## 2012-06-03 MED ORDER — ONDANSETRON HCL 4 MG/2ML IJ SOLN: 4.0000 mg | Freq: Once | INTRAMUSCULAR | Status: DC | PRN

## 2012-06-03 MED ORDER — FENTANYL CITRATE 0.05 MG/ML IJ SOLN
INTRAMUSCULAR | Status: DC | PRN
  Administered 2012-06-03 (×2): 50 ug via INTRAVENOUS

## 2012-06-03 MED ORDER — CHLOROPROCAINE HCL 1 % IJ SOLN
INTRAMUSCULAR | Status: AC
  Filled 2012-06-03: qty 30

## 2012-06-03 MED ORDER — LIDOCAINE HCL (CARDIAC) 20 MG/ML IV SOLN
INTRAVENOUS | Status: DC | PRN
  Administered 2012-06-03: 50 mg via INTRAVENOUS

## 2012-06-03 SURGICAL SUPPLY — 13 items
ABLATOR ENDOMETRIAL BIPOLAR (ABLATOR) ×3 IMPLANT
CATH ROBINSON RED A/P 16FR (CATHETERS) ×3 IMPLANT
CLOTH BEACON ORANGE TIMEOUT ST (SAFETY) ×3 IMPLANT
CONTAINER PREFILL 10% NBF 60ML (FORM) IMPLANT
DRESSING TELFA 8X3 (GAUZE/BANDAGES/DRESSINGS) ×3 IMPLANT
GLOVE BIOGEL PI IND STRL 6.5 (GLOVE) ×4 IMPLANT
GLOVE BIOGEL PI INDICATOR 6.5 (GLOVE) ×2
GLOVE SURG SS PI 6.0 STRL IVOR (GLOVE) ×3 IMPLANT
GOWN STRL REIN XL XLG (GOWN DISPOSABLE) ×9 IMPLANT
PACK HYSTEROSCOPY LF (CUSTOM PROCEDURE TRAY) ×3 IMPLANT
PAD OB MATERNITY 4.3X12.25 (PERSONAL CARE ITEMS) ×3 IMPLANT
TOWEL OR 17X24 6PK STRL BLUE (TOWEL DISPOSABLE) ×6 IMPLANT
WATER STERILE IRR 1000ML POUR (IV SOLUTION) ×3 IMPLANT

## 2012-06-03 NOTE — H&P (Signed)
Jodi Saunders is an 41 y.o. female [phone] presenting today for scheduled endometrial ablation for management of menorrhagia. Patient reports monthly menses lasting 5-7 days that are heavy in flow with passage of clots. Patient has been medically managed with OCP for the past 3-4 months but does not like taking the pill. She is not interested in other medical interventions and desires surgical intervention with the endometrial ablation  Pertinent Gynecological History: Menses: flow is moderate Bleeding: monthly Contraception: OCP (estrogen/progesterone) DES exposure: denies Blood transfusions: none Sexually transmitted diseases: no past history Previous GYN Procedures: DNC  Last mammogram: normal Date: 08/2011 Last pap: normal Date: 09/2010 OB History: G3, P1021   Menstrual History: Patient's last menstrual period was 04/14/2012.    Past Medical History  Diagnosis Date  . Allergy   . Multiple allergies   . Anemia     Past Surgical History  Procedure Date  . Cholecystectomy 2011  . Tonsillectomy 2008  . Multiple tooth extractions   . Wisdom tooth extraction   . Orif radius & ulna fractures 2002    Family History  Problem Relation Age of Onset  . Diabetes Father   . Hypertension Father   . Diabetes Paternal Grandmother   . Hypertension Paternal Grandmother   . Diabetes Paternal Grandfather   . Hypertension Paternal Grandfather     Social History:  reports that she has never smoked. She has never used smokeless tobacco. She reports that she drinks alcohol. She reports that she does not use illicit drugs.  Allergies:  Allergies  Allergen Reactions  . Peanut-Containing Drug Products     Facial swelling  . Sulfonamide Derivatives Nausea And Vomiting    REACTION: nausea, lightheaded  . Latex Rash    Blisters    Prescriptions prior to admission  Medication Sig Dispense Refill  . cetirizine (ZYRTEC) 10 MG tablet Take 10 mg by mouth daily as needed.       .  cyclobenzaprine (FLEXERIL) 5 MG tablet Take 1 tablet (5 mg total) by mouth 3 (three) times daily as needed for muscle spasms.  20 tablet  0  . EPINEPHrine (EPIPEN) 0.3 mg/0.3 mL DEVI Inject 0.3 mLs (0.3 mg total) into the muscle once.  1 Device  0  . ferrous sulfate 325 (65 FE) MG tablet TAKE 1 TABLET THREE TIMES A DAY  90 tablet  6  . ibuprofen (ADVIL,MOTRIN) 600 MG tablet Take 1 tablet (600 mg total) by mouth every 6 (six) hours as needed for pain.  30 tablet  0  . norethindrone-ethinyl estradiol 1/35 (ORTHO-NOVUM 1/35, 28,) tablet Take 1 tablet by mouth daily.  1 Package  11  . ondansetron (ZOFRAN) 4 MG tablet Take 1 tablet (4 mg total) by mouth every 6 (six) hours.  12 tablet  0  . oxyCODONE-acetaminophen (PERCOCET/ROXICET) 5-325 MG per tablet Take 1-2 tablets by mouth every 6 (six) hours as needed for pain.  15 tablet  0    Review of Systems  All other systems reviewed and are negative.    Height 5\' 4"  (1.626 m), weight 250 lb (113.399 kg), last menstrual period 04/14/2012. Physical Exam GENERAL: Well-developed, well-nourished female in no acute distress.  HEENT: Normocephalic, atraumatic. Sclerae anicteric.  NECK: Supple. Normal thyroid.  LUNGS: Clear to auscultation bilaterally.  HEART: Regular rate and rhythm. BREASTS: Symmetric in size. No palpable masses or lymphadenopathy, skin changes, or nipple drainage. ABDOMEN: Soft, nontender, nondistended. No organomegaly. PELVIC: Deferred to OR EXTREMITIES: No cyanosis, clubbing, or edema, 2+ distal  pulses.  No results found for this or any previous visit (from the past 24 hour(s)).  No results found. 10/11/2011 Endometrium, biopsy - SCANT FRAGMENTS OF PROLIFERATIVE TYPE ENDOMETRIUM. - BENIGN ENDOCERVICAL TYPE TISSUE. - THERE IS NO EVIDENCE OF HYPERPLASIA OR MALIGNANCY.  Assessment/Plan: 42 yo G3P1021 with menorrhagia here for endometrial ablation - Risks, benefits and alternatives were explained including but not limited to risks  of bleeding, infections, uterine perforation and damage to adjacent organs. Patient verbalized understanding and all questions were answered.  [name] 06/03/2012, 10:31 AM

## 2012-06-03 NOTE — Anesthesia Preprocedure Evaluation (Signed)
Anesthesia Evaluation  Patient identified by MRN, date of birth, ID band Patient awake    Reviewed: Allergy & Precautions, H&P , NPO status , Patient's Chart, lab work & pertinent test results  Airway Mallampati: I TM Distance: >3 FB Neck ROM: full    Dental No notable dental hx. (+) Teeth Intact   Pulmonary neg pulmonary ROS,    Pulmonary exam normal       Cardiovascular negative cardio ROS      Neuro/Psych negative psych ROS   GI/Hepatic negative GI ROS, Neg liver ROS,   Endo/Other    Renal/GU negative Renal ROS  negative genitourinary   Musculoskeletal negative musculoskeletal ROS (+)   Abdominal (+) + obese,   Peds negative pediatric ROS (+)  Hematology negative hematology ROS (+)   Anesthesia Other Findings   Reproductive/Obstetrics negative OB ROS                           Anesthesia Physical Anesthesia Plan  ASA: III  Anesthesia Plan: General   Post-op Pain Management:    Induction: Intravenous  Airway Management Planned: LMA  Additional Equipment:   Intra-op Plan:   Post-operative Plan:   Informed Consent: I have reviewed the patients History and Physical, chart, labs and discussed the procedure including the risks, benefits and alternatives for the proposed anesthesia with the patient or authorized representative who has indicated his/her understanding and acceptance.     Plan Discussed with: CRNA and Surgeon  Anesthesia Plan Comments:         Anesthesia Quick Evaluation

## 2012-06-03 NOTE — Anesthesia Postprocedure Evaluation (Signed)
Anesthesia Post Note  Patient: Jodi Saunders  Procedure(s) Performed: Procedure(s) (LRB): HYSTEROSCOPY WITH NOVASURE (N/A)  Anesthesia type: General  Patient location: PACU  Post pain: Pain level controlled  Post assessment: Post-op Vital signs reviewed  Last Vitals:  Filed Vitals:   06/03/12 1300  BP: 115/64  Pulse: 63  Temp:   Resp: 16    Post vital signs: Reviewed  Level of consciousness: sedated  Complications: No apparent anesthesia complicationsfj

## 2012-06-03 NOTE — Transfer of Care (Signed)
Immediate Anesthesia Transfer of Care Note  Patient: Jodi Saunders  Procedure(s) Performed: Procedure(s) (LRB) with comments: HYSTEROSCOPY WITH NOVASURE (N/A)  Patient Location: PACU  Anesthesia Type:General  Level of Consciousness: sedated  Airway & Oxygen Therapy: Patient Spontanous Breathing and Patient connected to nasal cannula oxygen  Post-op Assessment: Report given to PACU RN  Post vital signs: Reviewed and stable  Complications: No apparent anesthesia complications

## 2012-06-03 NOTE — Op Note (Signed)
PREOPERATIVE DIAGNOSIS:  menorrhagia. POSTOPERATIVE DIAGNOSIS: The same PROCEDURE: Endometrial ablation with hysteroscopy- NovaSure SURGEON:  Dr. Catalina Antigua   INDICATIONS: 41 y.o. yo [id number]  here for endometrial ablation for the management of menorrhagia .Risks of surgery were discussed with the patient including but not limited to: bleeding which may require transfusion; infection which may require antibiotics; injury to uterus or surrounding organs; need for additional procedures including laparotomy or laparoscopy; and other postoperative/anesthesia complications. Written informed consent was obtained.    FINDINGS:  A 9 size anteverted uterus.  Diffuse polypoid endometrium and small 1 cm posterior fibroid visualized.  Normal ostia bilaterally.  ANESTHESIA:   General, paracervical block. INTRAVENOUS FLUIDS:  1000 ml of LR FLUID DEFICITS:  105 ml of Lactated Ringers ESTIMATED BLOOD LOSS:  Less than 20 ml. SPECIMENS: None COMPLICATIONS:  None immediate.  PROCEDURE DETAILS:  The patient was taken to the operating room where general anesthesia was administered and was found to be adequate.  After an adequate timeout was performed, she was placed in the dorsal lithotomy position and examined; then prepped and draped in the sterile manner.   Her bladder was catheterized for an unmeasured amount of clear, yellow urine. A speculum was then placed in the patient's vagina and a single tooth tenaculum was applied to the anterior lip of the cervix.  A paracervical block using 1% Marcaine was administered.  The uterine cavity was sounded to 9 cm and the cervix was dilated manually with hagar dilators to accommodate the 3 mm hysteroscope.  Using the hysteroscope to determine the level of the internal os, a uterine cavity of 5 cm was determined. Once the cervix was dilated, the hysteroscope was inserted under direct visualization.  The uterine cavity was carefully examined, both ostia were recognized, and  diffusely proliferative endometrium with polypoid fragments was noted. The cervix was further dilated to 8 mm, the NovaSure device was inserted and a cavity width of 4.5 mm was determined. Using a power of 124, for 1 min 40 sec, the endometrial ablation was performed. The hysteroscope was then re-introduced into the uterine cavity, confirming complete ablation of the endometrium. The tenaculum was removed from the anterior lip of the cervix and the vaginal speculum was removed after noting good hemostasis.  The patient tolerated the procedure well and was taken to the recovery area awake, extubated and in stable condition.

## 2012-06-04 ENCOUNTER — Encounter (HOSPITAL_COMMUNITY): Payer: Self-pay | Admitting: Obstetrics and Gynecology

## 2012-06-20 ENCOUNTER — Encounter: Payer: Self-pay | Admitting: Obstetrics and Gynecology

## 2012-06-20 ENCOUNTER — Ambulatory Visit (INDEPENDENT_AMBULATORY_CARE_PROVIDER_SITE_OTHER): Payer: BC Managed Care – PPO | Admitting: Obstetrics and Gynecology

## 2012-06-20 VITALS — BP 128/92 | HR 104 | Temp 97.7°F | Ht 64.0 in | Wt 246.3 lb

## 2012-06-20 DIAGNOSIS — Z09 Encounter for follow-up examination after completed treatment for conditions other than malignant neoplasm: Secondary | ICD-10-CM

## 2012-06-20 NOTE — Progress Notes (Signed)
Patient ID: Jodi Saunders, female   DOB: Jun 24, 1970, 42 y.o.   MRN: [id number] 42 yo s/p endometrial ablation with Novasure on 12/16 presenting today for post-op check. Patient has been doing well post-op. Reports pain was well controlled. She started experiencing a light pink discharge a few days ago with some cramping pain.  GENERAL: Well-developed, well-nourished female in no acute distress.  ABDOMEN: Soft, nontender, nondistended. No organomegaly. PELVIC: Normal external female genitalia. Vagina is pink and rugated.  Normal discharge. Normal appearing cervix. Uterus is normal in size.  No adnexal mass or tenderness. EXTREMITIES: No cyanosis, clubbing, or edema, 2+ distal pulses.  A/P 41 [id number] s/p Novasure endometrial ablation on 12/16 - Patient doing well postoperatively - Advised to keep menstrual calendar - Patient medically cleared to resume all activities of daily living - RTC for annual exam or prn

## 2012-07-19 ENCOUNTER — Telehealth: Payer: Self-pay

## 2012-07-19 NOTE — Telephone Encounter (Signed)
Pt called and said she had an endometrial abalation on 12/16 and had some questions about her recovery.

## 2012-07-22 NOTE — Telephone Encounter (Signed)
Called [facility] and left a message with a female that we are returning your call- please call clinic back.

## 2012-07-23 NOTE — Telephone Encounter (Signed)
Called patient and patient's brother answered and said she was not in right now but could take a message. Told him to just let the patient know that we are trying to get in touch with her and that she can call us tomorrow between 8-5. Patient's brother stated he would give her (the patient) that message.

## 2012-07-24 NOTE — Telephone Encounter (Signed)
Called patient back and told her we received her message about questions in regards to her ablation in December. Patient stated she is still having a blood tinged d/c or orangeish d/c from time to time but that it has slowed down since her menstrual cycle. Per Wynelle Bourgeois this d/c is nothing concerning but that it is probably just her body regulating from the ablation but should it get heavier or brighter to give [place] a call back. Told patient these instructions. Patient verbalized understanding and had no further questions

## 2012-07-24 NOTE — Telephone Encounter (Signed)
Patient called and left message stating she is a patient of Dr Jolayne Panther and she has some follow up questions about her ablation she had done in December and would like a call back

## 2012-08-27 ENCOUNTER — Ambulatory Visit (INDEPENDENT_AMBULATORY_CARE_PROVIDER_SITE_OTHER): Payer: BC Managed Care – PPO | Admitting: Family Medicine

## 2012-08-27 ENCOUNTER — Encounter: Payer: Self-pay | Admitting: Family Medicine

## 2012-08-27 VITALS — BP 132/92 | HR 84 | Ht 64.5 in | Wt 241.7 lb

## 2012-08-27 DIAGNOSIS — D649 Anemia, unspecified: Secondary | ICD-10-CM

## 2012-08-27 DIAGNOSIS — E669 Obesity, unspecified: Secondary | ICD-10-CM

## 2012-08-27 DIAGNOSIS — R030 Elevated blood-pressure reading, without diagnosis of hypertension: Secondary | ICD-10-CM

## 2012-08-27 DIAGNOSIS — Z01419 Encounter for gynecological examination (general) (routine) without abnormal findings: Secondary | ICD-10-CM

## 2012-08-27 DIAGNOSIS — Z0000 Encounter for general adult medical examination without abnormal findings: Secondary | ICD-10-CM

## 2012-08-27 DIAGNOSIS — R7309 Other abnormal glucose: Secondary | ICD-10-CM

## 2012-08-27 DIAGNOSIS — J309 Allergic rhinitis, unspecified: Secondary | ICD-10-CM

## 2012-08-27 DIAGNOSIS — N920 Excessive and frequent menstruation with regular cycle: Secondary | ICD-10-CM

## 2012-08-27 LAB — COMPREHENSIVE METABOLIC PANEL
ALT: 11 U/L (ref 0–35)
Albumin: 4.4 g/dL (ref 3.5–5.2)
Alkaline Phosphatase: 60 U/L (ref 39–117)
CO2: 28 mEq/L (ref 19–32)
Potassium: 4 mEq/L (ref 3.5–5.3)
Sodium: 139 mEq/L (ref 135–145)
Total Bilirubin: 0.8 mg/dL (ref 0.3–1.2)
Total Protein: 6.9 g/dL (ref 6.0–8.3)

## 2012-08-27 LAB — LIPID PANEL
HDL: 48 mg/dL (ref >39)
LDL Cholesterol: 104 mg/dL — ABNORMAL HIGH (ref 0–99)
Total CHOL/HDL Ratio: 3.5 Ratio

## 2012-08-27 NOTE — Patient Instructions (Addendum)
Ms. Redge Gainer,  Thank you very much for coming in to see me today. It was a pleasure meeting you.   Goals for healthy lifestyle:  1. Eat breakfast w/in on hr of waking up.  2. Lunch and dinner, should have a vegetable  3. Afternoon snack ( do not go longer than 4 hrs without eating).  4. 20 minutes, most days of the week.   Schedulde mammogram   F/u in one month.  I will call to discuss lab results.   Dr. Armen Pickup

## 2012-08-27 NOTE — Assessment & Plan Note (Signed)
A: improved over past year with 10 lb weight loss.  P:  See AVS/note for goals. Patient provided with goal sheet.

## 2012-08-27 NOTE — Progress Notes (Signed)
Subjective:     Patient ID: Jodi Saunders, female   DOB: 09/06/1970, 42 y.o.   MRN: [id number]  HPI 42 yo F presents for health maintenance physical, however the majority of the time was spent discussing diet and weight management.   Medical History:  1. Elevated BP: elevated BP in the past. Never treated.  2. Obesity: lost 10 lbs in past year. Hx of back pain, menorrhagia and elevated blood sugar.  3. Menorrhagia: s/p novosure ablation 05/2012. Having periods monthly. Period in Jan was heavy, feb period much lighter, March period has yet to start.   Diet History:  Eating Pattern:   Meals: breakfast, lunch, dinner  Snacks:  Avoids  Nighttime Snacks: none, usually does not eat past 8:30 PM  24 hr Recall:  Wake up: 7:30 AM  Breakfast (9:30 AM): pancakes 3, bacon 3 slices, homefries 4-5, orange juice 12 oz , bottle of water  Lunch/Dinner (11:45 AM): baby back ribs (3 oz), baked beans 3/4 c, mac and cheese 3/4 c, 12 oz Dr. Reino Kent, water  Dinner/Supper (6:30 PM): fried fish fillet 2.5, 10 french fries, water  Snacks:  3:45 PM cinnamon donut, regular   Yesterday was somewhat unusual due to picking up parents for airport.   Everyday foods/drinks: water,  Eat out pattern: 3-4 times per weeks  Exercise: line dance 1 hr/week   Review of Systems Patient Information Form: Screening and ROS  AUDIT-C Score: 1 Do you feel safe in relationships? yes PHQ-2:negative  Review of Symptoms  General:  Negative for nexplained weight loss, fever Skin: Negative for new or changing mole, sore that won't heal HEENT: Negative for trouble hearing, trouble seeing, ringing in ears, mouth sores, hoarseness, change in voice, dysphagia. CV:  Negative for chest pain, dyspnea, edema, palpitations Resp: Negative for cough, dyspnea, hemoptysis GI: Negative for nausea, vomiting, diarrhea, constipation, abdominal pain, melena, hematochezia. GU: Negative for dysuria, incontinence, urinary hesitance, hematuria,  vaginal or penile discharge, polyuria, sexual difficulty, lumps in testicle or breasts MSK: Negative for muscle cramps or aches, joint pain or swelling Neuro: Negative for headaches, weakness, numbness, dizziness, passing out/fainting Psych: Negative for depression, anxiety, memory problems    Objective:   Physical Exam BP 148/97  Pulse 84  Ht 5' 4.5" (1.638 m)  Wt 241 lb 11.2 oz (109.634 kg)  BMI 40.86 kg/m2  LMP 08/02/2012 General appearance: alert, cooperative, no distress and moderately obese    Assessment and Plan:      Assessment:  Excess carbohydrate and saturated fat intake. Minimal exercise. Prolonged fasting in the morning.   Recommendations:  1. Eat breakfast w/in on hr of waking up.  2. Lunch and dinner, should have a vegetable  3. Afternoon snack ( do not go longer than 4 hrs without eating).  4. 20 minutes, most days of the week.

## 2012-08-27 NOTE — Assessment & Plan Note (Signed)
A: elevated diastolic BP. Asymptomatic. P: Check electrolytes today. establish measurable goals for weight loss and exercise. F/u in one month for repeat BP check. If BP at goal at f/u continue to manage with lifestyle modification if BP elevated start thiazide diuretic.

## 2012-08-27 NOTE — Assessment & Plan Note (Signed)
A: hx of elevated CBG. Fasting CBG last year was normal. P: recheck fasting CBG today.

## 2012-08-27 NOTE — Assessment & Plan Note (Addendum)
A: improved s/p novosure endometrial ablation. Patient is not taking iron for anemia.  P: check Hgb today.

## 2012-08-27 NOTE — Assessment & Plan Note (Signed)
Restart zyrtec prn

## 2012-08-27 NOTE — Assessment & Plan Note (Signed)
Deferred exam due to long discussion about diet.  P: Patient to schedule mammogram Patient to have pap done at [facility] hospital Screening labs: fasting CMP, Hgb, lipid panel

## 2012-08-29 ENCOUNTER — Other Ambulatory Visit: Payer: Self-pay

## 2012-08-29 DIAGNOSIS — Z1231 Encounter for screening mammogram for malignant neoplasm of breast: Secondary | ICD-10-CM

## 2012-08-30 ENCOUNTER — Encounter: Payer: Self-pay | Admitting: Family Medicine

## 2012-08-30 ENCOUNTER — Telehealth: Payer: Self-pay | Admitting: Family Medicine

## 2012-08-30 NOTE — Telephone Encounter (Signed)
Called patient. Left VM. Sending lab result letter.   The test results show that you still anemia and should continue iron. Your chemistries are all normal. Your screening for diabetes is negative. Your LDL, bad cholesterol, is slightly elevated but for you, goal will be less than 160 and it is well below that.

## 2012-09-02 ENCOUNTER — Telehealth: Payer: Self-pay | Admitting: Family Medicine

## 2012-09-02 ENCOUNTER — Encounter: Payer: Self-pay | Admitting: Obstetrics and Gynecology

## 2012-09-02 ENCOUNTER — Ambulatory Visit (INDEPENDENT_AMBULATORY_CARE_PROVIDER_SITE_OTHER): Payer: BC Managed Care – PPO | Admitting: Obstetrics and Gynecology

## 2012-09-02 VITALS — BP 140/90 | HR 74 | Ht 64.0 in | Wt 246.3 lb

## 2012-09-02 DIAGNOSIS — Z01419 Encounter for gynecological examination (general) (routine) without abnormal findings: Secondary | ICD-10-CM

## 2012-09-02 NOTE — Telephone Encounter (Signed)
Patient is calling because she needs a new Rx with refills on her Iron sent to [facility] on [address].  Please call patient when this has been sent.

## 2012-09-02 NOTE — Progress Notes (Signed)
  Subjective:     Jodi Saunders is a 42 y.o. female [id number] with LMP 08/02/12 and BMI 42 who is here for a comprehensive physical exam. The patient reports no problems. Patient is s/p endometrial ablation in 05/2012. She reports cycles lasting 4-5 days since the procedure. Overall she is happy with the results but continues to experience cramping pain which is relieved by ibuprofen. Patient is interested in contraception as she is in a new relationship.  History   Social History  . Marital Status: Single    Spouse Name: N/A    Number of Children: N/A  . Years of Education: N/A   Occupational History  . Not on file.   Social History Main Topics  . Smoking status: Never Smoker   . Smokeless tobacco: Never Used  . Alcohol Use: Yes     Comment: occasionally  . Drug Use: No  . Sexually Active: Yes    Birth Control/ Protection: None   Other Topics Concern  . Not on file   Social History Narrative  . No narrative on file   Health Maintenance  Topic Date Due  . Influenza Vaccine  02/18/2012  . Tetanus/tdap  05/19/2013  . Pap Smear  10/09/2013       Review of Systems A comprehensive review of systems was negative.   Objective:      GENERAL: Well-developed, well-nourished female in no acute distress.  HEENT: Normocephalic, atraumatic. Sclerae anicteric.  NECK: Supple. Normal thyroid.  LUNGS: Clear to auscultation bilaterally.  HEART: Regular rate and rhythm. BREASTS: Symmetric in size. No palpable masses or lymphadenopathy, skin changes, or nipple drainage. ABDOMEN: Soft, nontender, obese. PELVIC: Normal external female genitalia. Vagina is pink and rugated.  Normal discharge. Normal appearing cervix. Bimanual exam limited secondary to body habitus. No adnexal mass or tenderness. EXTREMITIES: No cyanosis, clubbing, or edema, 2+ distal pulses.    Assessment:    Healthy female exam.      Plan:    Pap smear collected Patient scheduled for mammography on 4/3 Advised  patient to continue monthly self breast and vulva exam Patient is currently being monitored for hypertension by PCP. Discussed Mirena IUD for birth control and to help control her dysmenorrhea. Patient will return for IUD insertion See After Visit Summary for Counseling Recommendations

## 2012-09-02 NOTE — Patient Instructions (Signed)
Levonorgestrel intrauterine device (Mirena IUD) What is this medicine? LEVONORGESTREL IUD (LEE voe nor jes trel) is a contraceptive (birth control) device. It is used to prevent pregnancy and to treat heavy bleeding that occurs during your period. It can be used for up to 5 years. This medicine may be used for other purposes; ask your health care provider or pharmacist if you have questions. What should I tell my health care provider before I take this medicine? They need to know if you have any of these conditions: -abnormal Pap smear -cancer of the breast, uterus, or cervix -diabetes -endometritis -genital or pelvic infection now or in the past -have more than one sexual partner or your partner has more than one partner -heart disease -history of an ectopic or tubal pregnancy -immune system problems -IUD in place -liver disease or tumor -problems with blood clots or take blood-thinners -use intravenous drugs -uterus of unusual shape -vaginal bleeding that has not been explained -an unusual or allergic reaction to levonorgestrel, other hormones, silicone, or polyethylene, medicines, foods, dyes, or preservatives -pregnant or trying to get pregnant -breast-feeding How should I use this medicine? This device is placed inside the uterus by a health care professional. Talk to your pediatrician regarding the use of this medicine in children. Special care may be needed. Overdosage: If you think you have taken too much of this medicine contact a poison control center or emergency room at once. NOTE: This medicine is only for you. Do not share this medicine with others. What if I miss a dose? This does not apply. What may interact with this medicine? Do not take this medicine with any of the following medications: -amprenavir -bosentan -fosamprenavir This medicine may also interact with the following  medications: -aprepitant -barbiturate medicines for inducing sleep or treating seizures -bexarotene -griseofulvin -medicines to treat seizures like carbamazepine, ethotoin, felbamate, oxcarbazepine, phenytoin, topiramate -modafinil -pioglitazone -rifabutin -rifampin -rifapentine -some medicines to treat HIV infection like atazanavir, indinavir, lopinavir, nelfinavir, tipranavir, ritonavir -St. John's wort -warfarin This list may not describe all possible interactions. Give your health care provider a list of all the medicines, herbs, non-prescription drugs, or dietary supplements you use. Also tell them if you smoke, drink alcohol, or use illegal drugs. Some items may interact with your medicine. What should I watch for while using this medicine? Visit your doctor or health care professional for regular check ups. See your doctor if you or your partner has sexual contact with others, becomes HIV positive, or gets a sexual transmitted disease. This product does not protect you against HIV infection (AIDS) or other sexually transmitted diseases. You can check the placement of the IUD yourself by reaching up to the top of your vagina with clean fingers to feel the threads. Do not pull on the threads. It is a good habit to check placement after each menstrual period. Call your doctor right away if you feel more of the IUD than just the threads or if you cannot feel the threads at all. The IUD may come out by itself. You may become pregnant if the device comes out. If you notice that the IUD has come out use a backup  birth control method like condoms and call your health care provider. Using tampons will not change the position of the IUD and are okay to use during your period. What side effects may I notice from receiving this medicine? Side effects that you should report to your doctor or health care professional as soon as possible: -allergic reactions like skin rash, itching or hives, swelling  of the face, lips, or tongue -fever, flu-like symptoms -genital sores -high blood pressure -no menstrual period for 6 weeks during use -pain, swelling, warmth in the leg -pelvic pain or tenderness -severe or sudden headache -signs of pregnancy -stomach cramping -sudden shortness of breath -trouble with balance, talking, or walking -unusual vaginal bleeding, discharge -yellowing of the eyes or skin Side effects that usually do not require medical attention (report to your doctor or health care professional if they continue or are bothersome): -acne -breast pain -change in sex drive or performance -changes in weight -cramping, dizziness, or faintness while the device is being inserted -headache -irregular menstrual bleeding within first 3 to 6 months of use -nausea This list may not describe all possible side effects. Call your doctor for medical advice about side effects. You may report side effects to FDA at 1-800-FDA-1088. Where should I keep my medicine? This does not apply. NOTE: This sheet is a summary. It may not cover all possible information. If you have questions about this medicine, talk to your doctor, pharmacist, or health care provider.  2012, [employer] Standard. (06/26/2008 6:39:08 PM) Preventive Care for Adults, Female A healthy lifestyle and preventive care can promote health and wellness. Preventive health guidelines for women include the following key practices.  A routine yearly physical is a good way to check with your caregiver about your health and preventive screening. It is a chance to share any concerns and updates on your health, and to receive a thorough exam.  Visit your dentist for a routine exam and preventive care every 6 months. Brush your teeth twice a day and floss once a day. Good oral hygiene prevents tooth decay and gum disease.  The frequency of eye exams is based on your age, health, family medical history, use of contact lenses, and other  factors. Follow your caregiver's recommendations for frequency of eye exams.  Eat a healthy diet. Foods like vegetables, fruits, whole grains, low-fat dairy products, and lean protein foods contain the nutrients you need without too many calories. Decrease your intake of foods high in solid fats, added sugars, and salt. Eat the right amount of calories for you.Get information about a proper diet from your caregiver, if necessary.  Regular physical exercise is one of the most important things you can do for your health. Most adults should get at least 150 minutes of moderate-intensity exercise (any activity that increases your heart rate and causes you to sweat) each week. In addition, most adults need muscle-strengthening exercises on 2 or more days a week.  Maintain a healthy weight. The body mass index (BMI) is a screening tool to identify possible weight problems. It provides an estimate of body fat based on height and weight. Your caregiver can help determine your BMI, and can help you achieve or maintain a healthy weight.For adults 20 years and older:  A BMI below 18.5 is considered underweight.  A BMI of 18.5 to 24.9 is normal.  A BMI of 25 to 29.9 is considered overweight.  A BMI of 30 and above is considered obese.  Maintain normal blood lipids and cholesterol  levels by exercising and minimizing your intake of saturated fat. Eat a balanced diet with plenty of fruit and vegetables. Blood tests for lipids and cholesterol should begin at age 85 and be repeated every 5 years. If your lipid or cholesterol levels are high, you are over 50, or you are at high risk for heart disease, you may need your cholesterol levels checked more frequently.Ongoing high lipid and cholesterol levels should be treated with medicines if diet and exercise are not effective.  If you smoke, find out from your caregiver how to quit. If you do not use tobacco, do not start.  If you are pregnant, do not drink  alcohol. If you are breastfeeding, be very cautious about drinking alcohol. If you are not pregnant and choose to drink alcohol, do not exceed 1 drink per day. One drink is considered to be 12 ounces (355 mL) of beer, 5 ounces (148 mL) of wine, or 1.5 ounces (44 mL) of liquor.  Avoid use of street drugs. Do not share needles with anyone. Ask for help if you need support or instructions about stopping the use of drugs.  High blood pressure causes heart disease and increases the risk of stroke. Your blood pressure should be checked at least every 1 to 2 years. Ongoing high blood pressure should be treated with medicines if weight loss and exercise are not effective.  If you are 61 to 41 years old, ask your caregiver if you should take aspirin to prevent strokes.  Diabetes screening involves taking a blood sample to check your fasting blood sugar level. This should be done once every 3 years, after age 105, if you are within normal weight and without risk factors for diabetes. Testing should be considered at a younger age or be carried out more frequently if you are overweight and have at least 1 risk factor for diabetes.  Breast cancer screening is essential preventive care for women. You should practice "breast self-awareness." This means understanding the normal appearance and feel of your breasts and may include breast self-examination. Any changes detected, no matter how small, should be reported to a caregiver. Women in their 9s and 30s should have a clinical breast exam (CBE) by a caregiver as part of a regular health exam every 1 to 3 years. After age 68, women should have a CBE every year. Starting at age 59, women should consider having a mammography (breast X-ray test) every year. Women who have a family history of breast cancer should talk to their caregiver about genetic screening. Women at a high risk of breast cancer should talk to their caregivers about having magnetic resonance imaging (MRI)  and a mammography every year.  The Pap test is a screening test for cervical cancer. A Pap test can show cell changes on the cervix that might become cervical cancer if left untreated. A Pap test is a procedure in which cells are obtained and examined from the lower end of the uterus (cervix).  Women should have a Pap test starting at age 33.  Between ages 75 and 58, Pap tests should be repeated every 2 years.  Beginning at age 24, you should have a Pap test every 3 years as long as the past 3 Pap tests have been normal.  Some women have medical problems that increase the chance of getting cervical cancer. Talk to your caregiver about these problems. It is especially important to talk to your caregiver if a new problem develops soon after your last  Pap test. In these cases, your caregiver may recommend more frequent screening and Pap tests.  The above recommendations are the same for women who have or have not gotten the vaccine for human papillomavirus (HPV).  If you had a hysterectomy for a problem that was not cancer or a condition that could lead to cancer, then you no longer need Pap tests. Even if you no longer need a Pap test, a regular exam is a good idea to make sure no other problems are starting.  If you are between ages 18 and 64, and you have had normal Pap tests going back 10 years, you no longer need Pap tests. Even if you no longer need a Pap test, a regular exam is a good idea to make sure no other problems are starting.  If you have had past treatment for cervical cancer or a condition that could lead to cancer, you need Pap tests and screening for cancer for at least 20 years after your treatment.  If Pap tests have been discontinued, risk factors (such as a new sexual partner) need to be reassessed to determine if screening should be resumed.  The HPV test is an additional test that may be used for cervical cancer screening. The HPV test looks for the virus that can cause  the cell changes on the cervix. The cells collected during the Pap test can be tested for HPV. The HPV test could be used to screen women aged 72 years and older, and should be used in women of any age who have unclear Pap test results. After the age of 58, women should have HPV testing at the same frequency as a Pap test.  Colorectal cancer can be detected and often prevented. Most routine colorectal cancer screening begins at the age of 37 and continues through age 70. However, your caregiver may recommend screening at an earlier age if you have risk factors for colon cancer. On a yearly basis, your caregiver may provide home test kits to check for hidden blood in the stool. Use of a small camera at the end of a tube, to directly examine the colon (sigmoidoscopy or colonoscopy), can detect the earliest forms of colorectal cancer. Talk to your caregiver about this at age 72, when routine screening begins. Direct examination of the colon should be repeated every 5 to 10 years through age 72, unless early forms of pre-cancerous polyps or small growths are found.  Hepatitis C blood testing is recommended for all people born from 9 through 1965 and any individual with known risks for hepatitis C.  Practice safe sex. Use condoms and avoid high-risk sexual practices to reduce the spread of sexually transmitted infections (STIs). STIs include gonorrhea, chlamydia, syphilis, trichomonas, herpes, HPV, and human immunodeficiency virus (HIV). Herpes, HIV, and HPV are viral illnesses that have no cure. They can result in disability, cancer, and death. Sexually active women aged 18 and younger should be checked for chlamydia. Older women with new or multiple partners should also be tested for chlamydia. Testing for other STIs is recommended if you are sexually active and at increased risk.  Osteoporosis is a disease in which the bones lose minerals and strength with aging. This can result in serious bone fractures.  The risk of osteoporosis can be identified using a bone density scan. Women ages 42 and over and women at risk for fractures or osteoporosis should discuss screening with their caregivers. Ask your caregiver whether you should take a calcium supplement or  vitamin D to reduce the rate of osteoporosis.  Menopause can be associated with physical symptoms and risks. Hormone replacement therapy is available to decrease symptoms and risks. You should talk to your caregiver about whether hormone replacement therapy is right for you.  Use sunscreen with sun protection factor (SPF) of 30 or more. Apply sunscreen liberally and repeatedly throughout the day. You should seek shade when your shadow is shorter than you. Protect yourself by wearing long sleeves, pants, a wide-brimmed hat, and sunglasses year round, whenever you are outdoors.  Once a month, do a whole body skin exam, using a mirror to look at the skin on your back. Notify your caregiver of new moles, moles that have irregular borders, moles that are larger than a pencil eraser, or moles that have changed in shape or color.  Stay current with required immunizations.  Influenza. You need a dose every fall (or winter). The composition of the flu vaccine changes each year, so being vaccinated once is not enough.  Pneumococcal polysaccharide. You need 1 to 2 doses if you smoke cigarettes or if you have certain chronic medical conditions. You need 1 dose at age 100 (or older) if you have never been vaccinated.  Tetanus, diphtheria, pertussis (Tdap, Td). Get 1 dose of Tdap vaccine if you are younger than age 27, are over 49 and have contact with an infant, are a Research scientist (physical sciences), are pregnant, or simply want to be protected from whooping cough. After that, you need a Td booster dose every 10 years. Consult your caregiver if you have not had at least 3 tetanus and diphtheria-containing shots sometime in your life or have a deep or dirty wound.  HPV. You  need this vaccine if you are a woman age 44 or younger. The vaccine is given in 3 doses over 6 months.  Measles, mumps, rubella (MMR). You need at least 1 dose of MMR if you were born in 1957 or later. You may also need a second dose.  Meningococcal. If you are age 59 to 61 and a first-year college student living in a residence hall, or have one of several medical conditions, you need to get vaccinated against meningococcal disease. You may also need additional booster doses.  Zoster (shingles). If you are age 21 or older, you should get this vaccine.  Varicella (chickenpox). If you have never had chickenpox or you were vaccinated but received only 1 dose, talk to your caregiver to find out if you need this vaccine.  Hepatitis A. You need this vaccine if you have a specific risk factor for hepatitis A virus infection or you simply wish to be protected from this disease. The vaccine is usually given as 2 doses, 6 to 18 months apart.  Hepatitis B. You need this vaccine if you have a specific risk factor for hepatitis B virus infection or you simply wish to be protected from this disease. The vaccine is given in 3 doses, usually over 6 months. Preventive Services / Frequency Ages 33 to 21  Blood pressure check.** / Every 1 to 2 years.  Lipid and cholesterol check.** / Every 5 years beginning at age 64.  Clinical breast exam.** / Every year after age 44.  Mammogram.** / Every year beginning at age 42 and continuing for as long as you are in good health. Consult with your caregiver.  Pap test.** / Every 3 years starting at age 77 through age 45 or 14 with a history of 3 consecutive normal Pap  tests.  HPV screening.** / Every 3 years from ages 9 through ages 8 to 48 with a history of 3 consecutive normal Pap tests.  Fecal occult blood test (FOBT) of stool. / Every year beginning at age 85 and continuing until age 26. You may not need to do this test if you get a colonoscopy every 10  years.  Flexible sigmoidoscopy or colonoscopy.** / Every 5 years for a flexible sigmoidoscopy or every 10 years for a colonoscopy beginning at age 87 and continuing until age 55.  Hepatitis C blood test.** / For all people born from 50 through 1965 and any individual with known risks for hepatitis C.  Skin self-exam. / Monthly.  Influenza immunization.** / Every year.  Pneumococcal polysaccharide immunization.** / 1 to 2 doses if you smoke cigarettes or if you have certain chronic medical conditions.  Tetanus, diphtheria, pertussis (Tdap, Td) immunization.** / A one-time dose of Tdap vaccine. After that, you need a Td booster dose every 10 years.  Measles, mumps, rubella (MMR) immunization. / You need at least 1 dose of MMR if you were born in 1957 or later. You may also need a second dose.  Varicella immunization.** / Consult your caregiver.  Meningococcal immunization.** / Consult your caregiver.  Hepatitis A immunization.** / Consult your caregiver. 2 doses, 6 to 18 months apart.  Hepatitis B immunization.** / Consult your caregiver. 3 doses, usually over 6 months. ** Family history and personal history of risk and conditions may change your caregiver's recommendations. Document Released: 08/01/2001 Document Revised: 08/28/2011 Document Reviewed: 10/31/2010 [facility] Patient Information 2013 [place], [place].

## 2012-09-03 MED ORDER — FERROUS SULFATE 325 (65 FE) MG PO TABS: ORAL_TABLET | ORAL | Status: DC

## 2012-09-03 NOTE — Telephone Encounter (Signed)
rx sent in please call pt.

## 2012-09-06 ENCOUNTER — Encounter: Payer: Self-pay | Admitting: Obstetrics and Gynecology

## 2012-09-06 DIAGNOSIS — R896 Abnormal cytological findings in specimens from other organs, systems and tissues: Secondary | ICD-10-CM

## 2012-09-17 ENCOUNTER — Telehealth: Payer: Self-pay | Admitting: *Deleted

## 2012-09-17 NOTE — Telephone Encounter (Signed)
Pt left message requesting test results. I returned her call and informed her of normal Pap test. She will keep appt as scheduled on 09/26/12 for IUD insertion.

## 2012-09-19 ENCOUNTER — Ambulatory Visit
Admission: RE | Admit: 2012-09-19 | Discharge: 2012-09-19 | Disposition: A | Discharge status: Home / Self Care | Payer: BC Managed Care – PPO | Source: Ambulatory Visit

## 2012-09-19 DIAGNOSIS — Z1231 Encounter for screening mammogram for malignant neoplasm of breast: Secondary | ICD-10-CM

## 2012-09-26 ENCOUNTER — Other Ambulatory Visit: Payer: Self-pay | Admitting: Obstetrics and Gynecology

## 2012-09-26 ENCOUNTER — Encounter: Payer: Self-pay | Admitting: Obstetrics and Gynecology

## 2012-09-26 ENCOUNTER — Ambulatory Visit (INDEPENDENT_AMBULATORY_CARE_PROVIDER_SITE_OTHER): Payer: BC Managed Care – PPO | Admitting: Obstetrics and Gynecology

## 2012-09-26 VITALS — BP 144/96 | HR 69 | Temp 97.3°F | Ht 64.0 in | Wt 243.3 lb

## 2012-09-26 DIAGNOSIS — N920 Excessive and frequent menstruation with regular cycle: Secondary | ICD-10-CM

## 2012-09-26 DIAGNOSIS — Z30430 Encounter for insertion of intrauterine contraceptive device: Secondary | ICD-10-CM

## 2012-09-26 DIAGNOSIS — Z01812 Encounter for preprocedural laboratory examination: Secondary | ICD-10-CM

## 2012-09-26 MED ORDER — LEVONORGESTREL 20 MCG/24HR IU IUD
INTRAUTERINE_SYSTEM | Freq: Once | INTRAUTERINE | Status: AC
  Administered 2012-09-26: 14:00:00 via INTRAUTERINE

## 2012-09-26 NOTE — Progress Notes (Signed)
Patient ID: Jodi Saunders, female   DOB: [birth year], 42 y.o.   MRN: [id number] 42 yo s/p endometrial ablation with dysmenorrhea and 5 day cycles requesting IUD insertion   IUD Procedure Note Patient identified, informed consent performed, signed copy in chart, time out was performed.  Urine pregnancy test negative.  Speculum placed in the vagina.  Cervix visualized.  Cleaned with Betadine x 2.  Grasped anteriorly with a single tooth tenaculum.  Uterus sounded to 7 cm.  Mirena IUD placed per manufacturer's recommendations.  Strings trimmed to 3 cm. Tenaculum was removed, good hemostasis noted.  Patient tolerated procedure well.   Patient given post procedure instructions and Mirena care card with expiration date.  Patient is asked to check IUD strings periodically and follow up in 4-6 weeks for IUD check.

## 2012-09-27 ENCOUNTER — Ambulatory Visit (INDEPENDENT_AMBULATORY_CARE_PROVIDER_SITE_OTHER): Payer: BC Managed Care – PPO | Admitting: Family Medicine

## 2012-09-27 ENCOUNTER — Encounter: Payer: Self-pay | Admitting: Family Medicine

## 2012-09-27 VITALS — BP 130/80 | HR 74 | Ht 64.0 in | Wt 243.0 lb

## 2012-09-27 DIAGNOSIS — R030 Elevated blood-pressure reading, without diagnosis of hypertension: Secondary | ICD-10-CM

## 2012-09-27 NOTE — Assessment & Plan Note (Signed)
A: normal BP today. Losing weight and exercising. P: no medications. Continue lifestyle modifications.

## 2012-09-27 NOTE — Patient Instructions (Addendum)
Leola,  Thank you for coming in today. Great job with walking and working. Great job eating breakfast. Down 3 lbs!  BP normal, no meds needed. F/u physical next march.   Dr. Armen Pickup

## 2012-09-27 NOTE — Progress Notes (Signed)
Subjective:     Patient ID: Jodi Saunders, female   DOB: 1971-04-11, 42 y.o.   MRN: [id number]  HPI 42 year old female presents for hypertension followup: She is currently exercising and has lost 3 pounds. She denies headache, vision changes, chest pain, shortness of breath, lower extremity edema.  Review of Systems As per history of present illness    Objective:   Physical Exam BP 130/80  Pulse 74  Ht 5\' 4"  (1.626 m)  Wt 243 lb (110.224 kg)  BMI 41.69 kg/m2  LMP 09/25/2012 BP Readings from Last 3 Encounters:  09/27/12 130/80  09/26/12 144/96  09/02/12 140/90  General appearance: alert, cooperative and no distress Lungs: clear to auscultation bilaterally Heart: regular rate and rhythm, S1, S2 normal, no murmur, click, rub or gallop Extremities: edema none     Assessment and Plan:

## 2012-10-09 ENCOUNTER — Telehealth: Payer: Self-pay | Admitting: *Deleted

## 2012-10-09 DIAGNOSIS — N898 Other specified noninflammatory disorders of vagina: Secondary | ICD-10-CM

## 2012-10-09 MED ORDER — FLUCONAZOLE 150 MG PO TABS: 150.0000 mg | ORAL_TABLET | Freq: Once | ORAL | Status: DC

## 2012-10-09 NOTE — Telephone Encounter (Signed)
Pt left message stating that she had Mirena IUD inserted on 4/11. She is having some cramps and sx of possible yeast infection. She wants to know if she needs to come in to be seen or can Rx be sent in for yeast.

## 2012-10-09 NOTE — Telephone Encounter (Signed)
Jodi Saunders called and left another message stating she had IUD placed and thinks she has a yeast infection. Called Jodi Saunders and she states she is having vaginal itching and irriation and milky white vaginal discharge. We discussed this could be a yeast infection and we can call in a prescription for diflucan ,discussed if that doesn't clear up the itching and discharge to call back and make appointment to be seen for  vaginal discharge. Also discussed may be due to recent IUD insertion. Also discussed her c/o cramping - she states are just sometimes and not bad enough to require medicine.  We discussed may be body adjusting to IUD or hopefully not rejecting it. Has f/u scheduled already .

## 2012-10-28 ENCOUNTER — Ambulatory Visit (INDEPENDENT_AMBULATORY_CARE_PROVIDER_SITE_OTHER): Payer: BC Managed Care – PPO | Admitting: Obstetrics and Gynecology

## 2012-10-28 ENCOUNTER — Encounter: Payer: Self-pay | Admitting: Obstetrics and Gynecology

## 2012-10-28 VITALS — BP 132/89 | HR 88 | Temp 97.4°F | Ht 64.0 in | Wt 246.4 lb

## 2012-10-28 DIAGNOSIS — Z30431 Encounter for routine checking of intrauterine contraceptive device: Secondary | ICD-10-CM

## 2012-10-28 NOTE — Progress Notes (Signed)
Patient has no complaints with IUD but is worried about hair loss since it was inserted.

## 2012-10-28 NOTE — Progress Notes (Signed)
Patient ID: Jodi Saunders, female   DOB: 1970-06-25, 42 y.o.   MRN: [id number] 42 yo [id number] s/p IUD placement on 4/10 here for string check. Patient reports also noticing excess hair shedding since IUD was placed.  SSE: Strings of appropriate length, approximately 2.5 cm. Vulva- ingrown hair follicle on left labia majora. Not fluctuant, measuring 0.5 cm  A/P 42 yo [id number] here for IUD check - Patient advised to continue monitoring for abnormal bleeding pattern and monitor hair loss - Advised to apply warm compresses to ingrown hair follicle and allow it to drain. Advised against shaving area - RTC in March for annual exam or prn

## 2012-11-05 ENCOUNTER — Telehealth: Payer: Self-pay | Admitting: *Deleted

## 2012-11-05 NOTE — Telephone Encounter (Signed)
Pt left message stating that she has additional questions about the Mirena IUD. She talked with Bonita Quin previously.

## 2012-11-06 ENCOUNTER — Telehealth: Payer: Self-pay

## 2012-11-06 NOTE — Telephone Encounter (Signed)
Duplicate encounter

## 2012-11-06 NOTE — Telephone Encounter (Signed)
Pt stated "I have a question about my Mirena IUD?"

## 2012-11-07 NOTE — Telephone Encounter (Signed)
Pt called the front desk and she informed that since she has had IUD her hair fallen out/thinning out.  Per Dr. Jolayne Panther, hair lose can be sx of IUD if there is more progesterone hormone.  Pt stated that she wanted it removed.  I advised pt to come in to speak with a provider so that she can discuss other regimens for her bleeding.  I informed pt what Dr. Jolayne Panther advised me and I let her know her appt is scheduled for 11/29/12 @ 1000.  Pt stated understanding.

## 2012-11-29 ENCOUNTER — Ambulatory Visit (INDEPENDENT_AMBULATORY_CARE_PROVIDER_SITE_OTHER): Payer: BC Managed Care – PPO | Admitting: Medical

## 2012-11-29 ENCOUNTER — Encounter: Payer: Self-pay | Admitting: Medical

## 2012-11-29 VITALS — BP 140/97 | HR 92 | Ht 64.0 in | Wt 250.0 lb

## 2012-11-29 DIAGNOSIS — Z30432 Encounter for removal of intrauterine contraceptive device: Secondary | ICD-10-CM

## 2012-11-29 DIAGNOSIS — N920 Excessive and frequent menstruation with regular cycle: Secondary | ICD-10-CM

## 2012-11-29 MED ORDER — MEDROXYPROGESTERONE ACETATE 150 MG/ML IM SUSP
150.0000 mg | INTRAMUSCULAR | Status: DC
  Administered 2012-11-29: 150 mg via INTRAMUSCULAR

## 2012-11-29 NOTE — Progress Notes (Signed)
Patient ID: Jodi Saunders, female   DOB: [birth year], 42 y.o.   MRN: [id number]  History:  Jodi Saunders  is a 42 y.o. [id number] who presents to clinic today for removal of IUD. The patient states that she is unhappy with the IUD as she has had cramping, irregular spotting and hair loss since the insertion. She states that she is not currently sexually active, but is interested in discussing options for birth control and bleeding control. She has previously been on OCPs, but has been hypertensive during each visit here recently. The patient denies history of HTN. Patient would like to try Depo Provera today.  The following portions of the patient's history were reviewed and updated as appropriate: allergies, current medications, past family history, past medical history, past social history, past surgical history and problem list.  Review of Systems:  Pertinent items are noted in HPI.  Objective:  Physical Exam BP 140/97  Pulse 92  Ht 5\' 4"  (1.626 m)  Wt 250 lb (113.399 kg)  BMI 42.89 kg/m2  LMP 10/21/2012 GENERAL: Well-developed, well-nourished female in no acute distress.  HEENT: Normocephalic, atraumatic.  LUNGS: Normal rate. Clear to auscultation bilaterally.  HEART: Regular rate and rhythm with no adventitious sounds.  ABDOMEN: Soft, nontender, nondistended. No organomegaly.  PELVIC: Normal external female genitalia. Vagina is pink and rugated.  Normal discharge. Normal cervix contour. EXTREMITIES: No cyanosis, clubbing, or edema, 2+ distal pulses.  GYNECOLOGY CLINIC PROCEDURE NOTE  IUD Removal  Patient was in the dorsal lithotomy position, normal external genitalia was noted.  A speculum was placed in the patient's vagina, normal discharge was noted, no lesions. The multiparous cervix was visualized, no lesions, no abnormal discharge. The strings of the IUD were grasped and pulled using ring forceps.  The IUD was successfully removed in its entirety. Patient tolerated the procedure  well.     Assessment & Plan:  Assessment: Hair loss with Mirena IUD Menorrhagia  Plans: 1. IUD removed today 2. Patient desires Depo Provera for birth control and bleeding control. No lapse in birth control. Depo provera injection today. 3. Follow-up in 12 weeks for next injection or sooner PRN  Jodi Starr, PA-C 11/29/2012 10:52 AM

## 2012-11-29 NOTE — Patient Instructions (Signed)

## 2012-12-03 ENCOUNTER — Encounter: Payer: Self-pay | Admitting: *Deleted

## 2012-12-26 ENCOUNTER — Telehealth: Payer: Self-pay | Admitting: Family Medicine

## 2012-12-26 DIAGNOSIS — Z91010 Allergy to peanuts: Secondary | ICD-10-CM

## 2012-12-26 NOTE — Telephone Encounter (Signed)
Epipen called in to [facility]  With no refills - unable to eprescribe. Pt called and informed that this has been called in . Wyatt Haste, RN-BSN

## 2012-12-26 NOTE — Telephone Encounter (Signed)
Patient is leaving for the [place] tomorrow morning and she just realized that her Epipen has expired and she needs a new one asap.  Can that please be taken care of right away.  [facility] uses [facility] on [address].

## 2013-02-14 ENCOUNTER — Ambulatory Visit (INDEPENDENT_AMBULATORY_CARE_PROVIDER_SITE_OTHER): Payer: BC Managed Care – PPO

## 2013-02-14 VITALS — BP 133/95 | HR 77 | Wt 248.7 lb

## 2013-02-14 DIAGNOSIS — N920 Excessive and frequent menstruation with regular cycle: Secondary | ICD-10-CM

## 2013-03-06 IMAGING — US US TRANSVAGINAL NON-OB
1 series · 13 of 25 positions shown · non-contrast
Comparison: None.

CLINICAL DATA: Menorrhagia with chronic anemia. LMP 09/30/2011

TRANSABDOMINAL AND TRANSVAGINAL ULTRASOUND OF PELVIS
TECHNIQUE: Both transabdominal and transvaginal ultrasound
examinations of the pelvis were performed. Transabdominal technique
was performed for global imaging of the pelvis including uterus,
ovaries, adnexal regions, and pelvic cul-de-sac.

[Series 1: us pelvis complete · 13 of 82 slices shown]
[im 1/82]
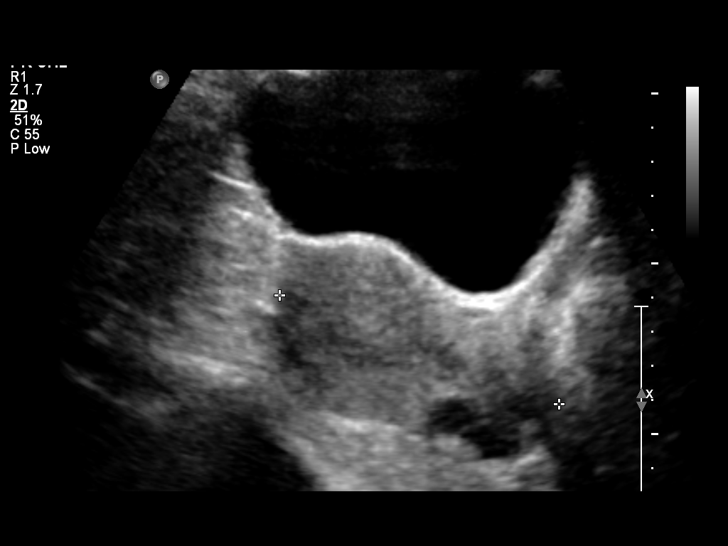
[im 7/82]
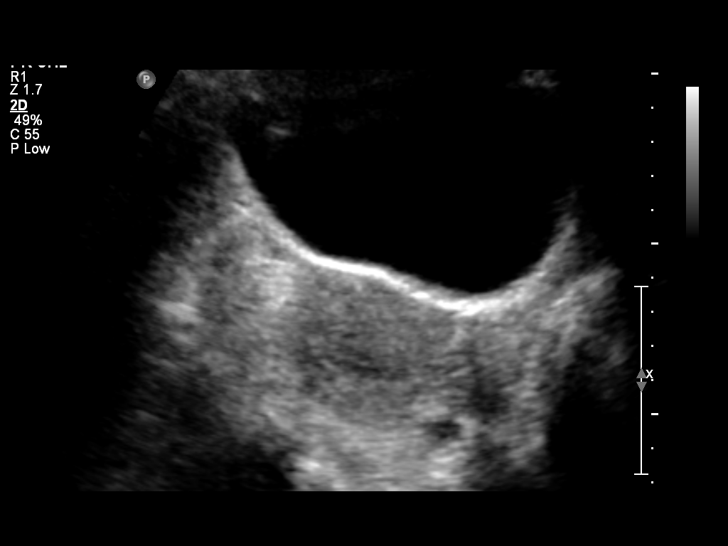
[im 14/82]
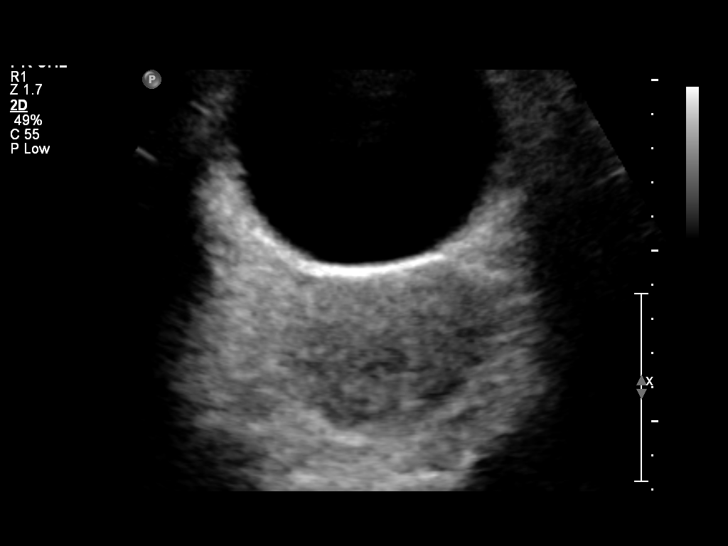
[im 21/82]
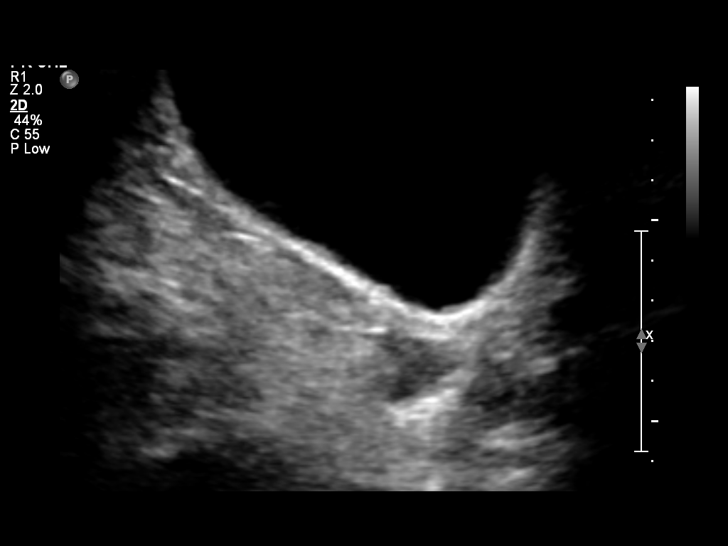
[im 28/82]
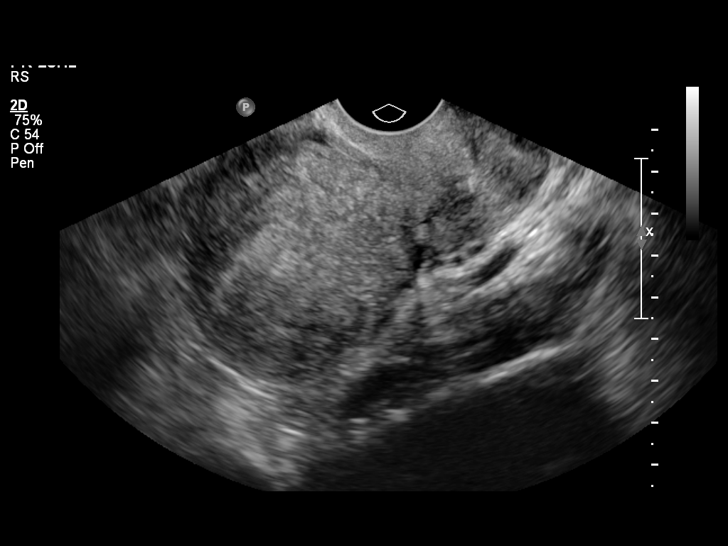
[im 34/82]
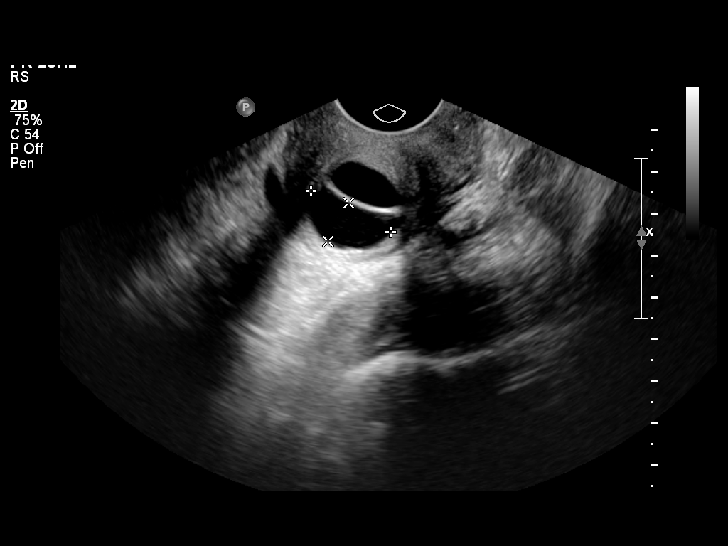
[im 41/82]
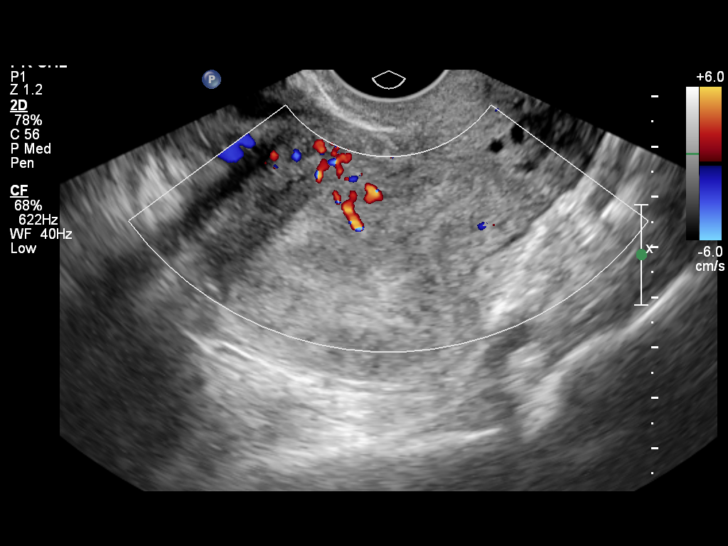
[im 48/82]
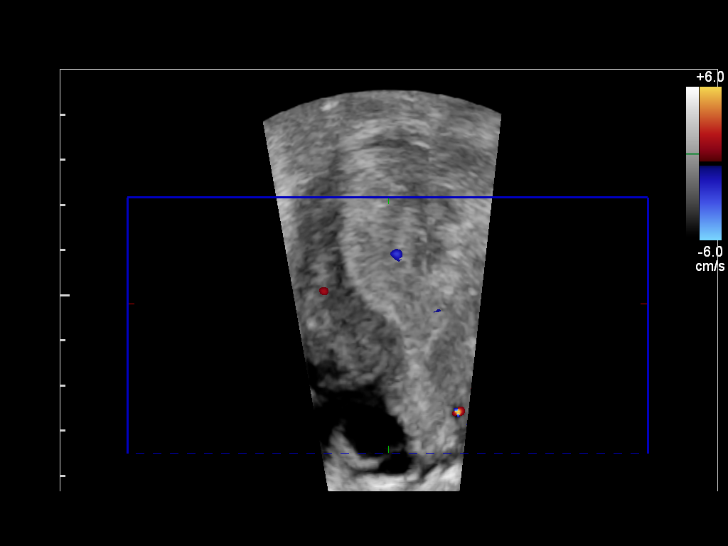
[im 55/82]
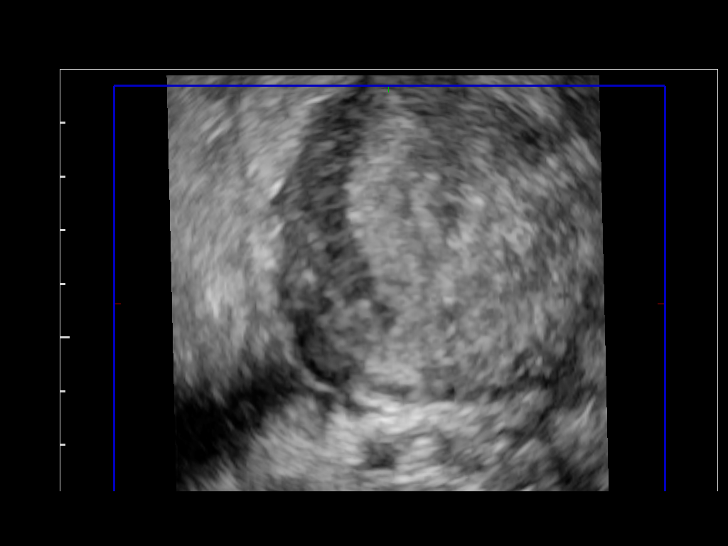
[im 61/82]
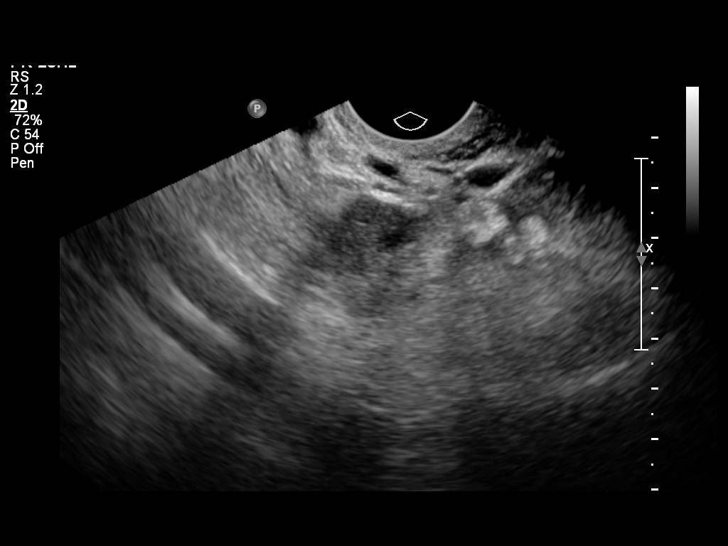
[im 68/82]
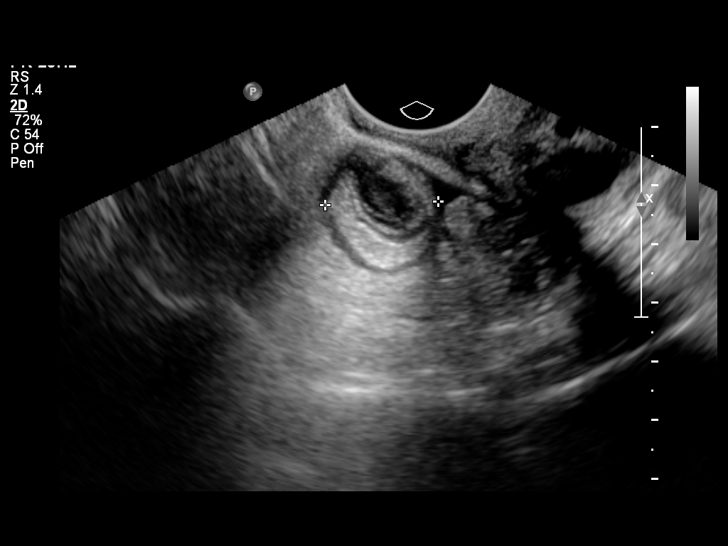
[im 75/82]
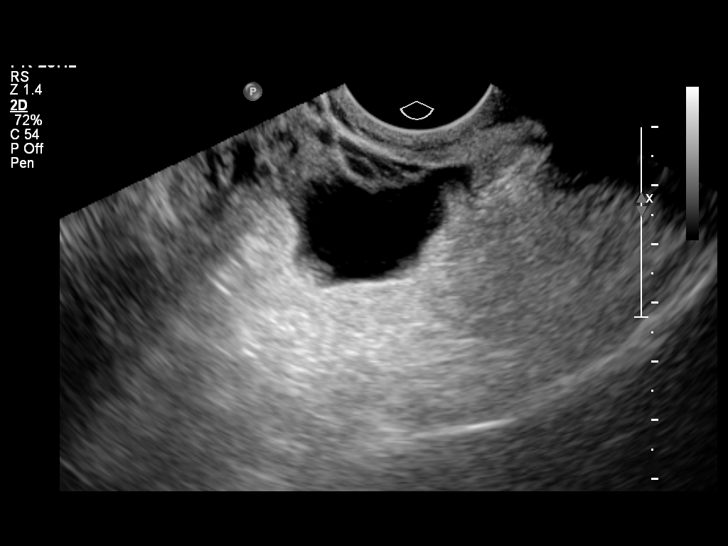
[im 82/82]
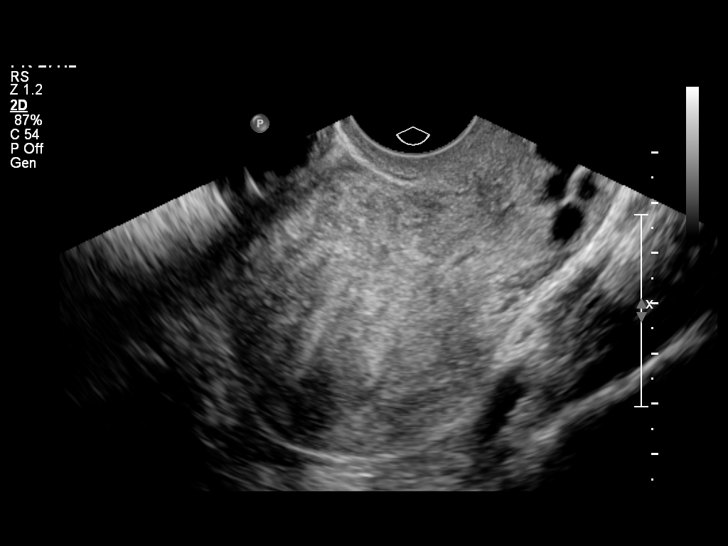

[13 of 25 positions shown; findings below may reference images not displayed]

It was necessary to proceed with endovaginal exam following the
transabdominal exam to visualize the myometrium, endometrium and
adnexa.
FINDINGS: Uterus: Demonstrates a sagittal length of 9.1 cm, AP depth of
cm and a transverse width of 6.6 cm.  Several Nabothian cysts are
seen within the cervix.  No definite mucosal abnormality is noted

Endometrium: The myometrial endometrial interface is poorly seen
with the overall endometrial lining appearing thickened and
heterogeneous measuring approximately 20 mm in width.  A more
accurate measurement is compromised by the poor endometrial
myometrial interface.  Color Doppler suggests the possibility of a
feeding vessel into the endometrial lining from the anterior
myometrium with no discrete endometrial lesion identifiable.

Right ovary:  Measures 2.8 x 1.8 x 1.8 cm and has a normal
appearance

Left ovary: Measures 3.5 x 1.9 x 2.6 cm and is best seen
transabdominally

Other findings: In the right adnexa there is a thick walled
avascular tubular structure identified which measures 6.0 x 1.9 x
1.5 cm which contains some diffuse low level echoes internally.
The appearance is suspicious for a dilated right fallopian tube and
raises concern for chronic salpingitis.
IMPRESSION: Poorly defined endometrial myometrial interface with an apparent
thickened heterogeneous endometrial lining.  Color Doppler suggests
the possibility of a feeder vessel into the myometrium. Poor
delineation of the endometrial myometrial interface can be the
result of adenomyosis. Given the poor delineation of the
endometrial lining and the presence of a questionable feeder
vessel, sonohysterography may be helpful for improved delineation
of the lining.

Thick walled tubular structure within the right adnexa is
suspicious for an abnormal fallopian tube [REDACTED] indicate the
presence of chronic salpingitis. Alternative etiologies for this
finding would include an abnormal bowel loop or thrombosed vessel.
Pelvic MRI with contrast can be utilized to help differentiate
between these diagnostic possibilities and can evaluate for
adenomyosis simultaneously..

## 2013-03-17 ENCOUNTER — Encounter: Payer: Self-pay | Admitting: Family Medicine

## 2013-03-17 ENCOUNTER — Ambulatory Visit (INDEPENDENT_AMBULATORY_CARE_PROVIDER_SITE_OTHER): Payer: BC Managed Care – PPO | Admitting: Family Medicine

## 2013-03-17 VITALS — BP 125/85 | HR 101 | Temp 98.3°F | Ht 64.0 in | Wt 246.0 lb

## 2013-03-17 DIAGNOSIS — R197 Diarrhea, unspecified: Secondary | ICD-10-CM

## 2013-03-17 DIAGNOSIS — R110 Nausea: Secondary | ICD-10-CM

## 2013-03-17 DIAGNOSIS — R112 Nausea with vomiting, unspecified: Secondary | ICD-10-CM | POA: Insufficient documentation

## 2013-03-17 MED ORDER — ONDANSETRON HCL 4 MG PO TABS: 4.0000 mg | ORAL_TABLET | Freq: Three times a day (TID) | ORAL | Status: DC | PRN

## 2013-03-17 NOTE — Assessment & Plan Note (Signed)
Likely viral gastro. Zofran PRN, imodium  Will consider other infectious sources if persists. Travel to [place] more than 2 mo ago not likely contributor H/o bowel surgeries so discussed warning signs of SBO/ileus May need DM workup Urine Preg equivocal so will order qualitative [facility]CG

## 2013-03-17 NOTE — Patient Instructions (Addendum)
You likely have viral gastroenteritis which should clear in another 3-5 days at most Please use the Zofran as needed Make sure to wash your hands very regularly Please come back if your symptoms get worse or do not improve as outlined above We will let you know if the results from your blood work are positive  Viral Gastroenteritis Viral gastroenteritis is also known as stomach flu. This condition affects the stomach and intestinal tract. It can cause sudden diarrhea and vomiting. The illness typically lasts 3 to 8 days. Most people develop an immune response that eventually gets rid of the virus. While this natural response develops, the virus can make you quite ill. CAUSES  Many different viruses can cause gastroenteritis, such as rotavirus or noroviruses. You can catch one of these viruses by consuming contaminated food or water. You may also catch a virus by sharing utensils or other personal items with an infected person or by touching a contaminated surface. SYMPTOMS  The most common symptoms are diarrhea and vomiting. These problems can cause a severe loss of body fluids (dehydration) and a body salt (electrolyte) imbalance. Other symptoms may include:  Fever.  Headache.  Fatigue.  Abdominal pain. DIAGNOSIS  Your caregiver can usually diagnose viral gastroenteritis based on your symptoms and a physical exam. A stool sample may also be taken to test for the presence of viruses or other infections. TREATMENT  This illness typically goes away on its own. Treatments are aimed at rehydration. The most serious cases of viral gastroenteritis involve vomiting so severely that you are not able to keep fluids down. In these cases, fluids must be given through an intravenous line (IV). HOME CARE INSTRUCTIONS   Drink enough fluids to keep your urine clear or pale yellow. Drink small amounts of fluids frequently and increase the amounts as tolerated.  Ask your caregiver for specific rehydration  instructions.  Avoid:  Foods high in sugar.  Alcohol.  Carbonated drinks.  Tobacco.  Juice.  Caffeine drinks.  Extremely hot or cold fluids.  Fatty, greasy foods.  Too much intake of anything at one time.  Dairy products until 24 to 48 hours after diarrhea stops.  You may consume probiotics. Probiotics are active cultures of beneficial bacteria. They may lessen the amount and number of diarrheal stools in adults. Probiotics can be found in yogurt with active cultures and in supplements.  Wash your hands well to avoid spreading the virus.  Only take over-the-counter or prescription medicines for pain, discomfort, or fever as directed by your caregiver. Do not give aspirin to children. Antidiarrheal medicines are not recommended.  Ask your caregiver if you should continue to take your regular prescribed and over-the-counter medicines.  Keep all follow-up appointments as directed by your caregiver. SEEK IMMEDIATE MEDICAL CARE IF:   You are unable to keep fluids down.  You do not urinate at least once every 6 to 8 hours.  You develop shortness of breath.  You notice blood in your stool or vomit. This may look like coffee grounds.  You have abdominal pain that increases or is concentrated in one small area (localized).  You have persistent vomiting or diarrhea.  You have a fever.  The patient is a child younger than 3 months, and he or she has a fever.  The patient is a child older than 3 months, and he or she has a fever and persistent symptoms.  The patient is a child older than 3 months, and he or she has a  fever and symptoms suddenly get worse.  The patient is a baby, and he or she has no tears when crying. MAKE SURE YOU:   Understand these instructions.  Will watch your condition.  Will get help right away if you are not doing well or get worse. Document Released: 06/05/2005 Document Revised: 08/28/2011 Document Reviewed: 03/22/2011 [facility] Patient  Information 2014 [place], [place].

## 2013-03-17 NOTE — Progress Notes (Signed)
Jodi Saunders is a 42 y.o. female who presents to [facility] today for [place] appt for nausea.  Nausea: present for 1 wk. Upon waking in mornign. Bubbling sensation in stomach. Feels like water in stomach. Comes on suddenly. Intermittent but seems to get worse when resting. Denies emesis but dry heaving x1. Loose stools over the past week. Non bloody/mucosy. Has been on Depo for 2 cycles w/o complaint. Denies sick contacts, fevers, recent travel. Last international travel in July to [place]. No known triggers.    The following portions of the patient's history were reviewed and updated as appropriate: allergies, current medications, past medical history, family and social history, and problem list.  Patient is a nonsmoker.    Past Medical History  Diagnosis Date  . Allergy   . Multiple allergies   . Anemia   . Hip pain 08/28/2011  . CHOLELITHIASIS, SYMPTOMATIC 12/28/2009    Qualifier: Diagnosis of  By: Sandi Mealy  MD, Judeth Cornfield    . BACK PAIN 08/05/2009    Qualifier: Diagnosis of  By: Wallene Huh  MD, Rande Lawman    . Tibialis tendinitis 11/19/2007    Qualifier: Diagnosis of  By: Edmon Crape MD, JOHN      ROS as above otherwise neg.    Medications reviewed. Current Outpatient Prescriptions  Medication Sig Dispense Refill  . cetirizine (ZYRTEC) 10 MG tablet Take 10 mg by mouth daily as needed.       . docusate sodium (COLACE) 100 MG capsule Take 100 mg by mouth 2 (two) times daily.      Marland Kitchen EPINEPHrine (EPIPEN) 0.3 mg/0.3 mL DEVI Inject 0.3 mLs (0.3 mg total) into the muscle once.  1 Device  0  . ferrous sulfate 325 (65 FE) MG tablet TAKE 1 TABLET THREE TIMES A DAY  90 tablet  6  . ibuprofen (ADVIL,MOTRIN) 600 MG tablet Take 1 tablet (600 mg total) by mouth every 6 (six) hours as needed for pain.  30 tablet  0  . levonorgestrel (MIRENA) 20 MCG/24HR IUD 1 each by Intrauterine route once.       Current Facility-Administered Medications  Medication Dose Route Frequency Provider Last Rate Last Dose  .  medroxyPROGESTERone (DEPO-PROVERA) injection 150 mg  150 mg Intramuscular Q90 days [name], PA-C   150 mg at 11/29/12 1104    Exam: BP 125/85  Pulse 101  Temp(Src) 98.3 F (36.8 C) (Oral)  Ht 5\' 4"  (1.626 m)  Wt 246 lb (111.585 kg)  BMI 42.21 kg/m2  LMP 12/15/2012 Gen: Well NAD HEENT: EOMI,  MMM Lungs: CTABL Nl WOB Heart: RRR no MRG Abd: NABS, NT, ND Exts: Non edematous BL  LE, warm and well perfused.   No results found for this or any previous visit (from the past 72 hour(s)).

## 2013-03-20 ENCOUNTER — Telehealth: Payer: Self-pay | Admitting: Family Medicine

## 2013-03-20 NOTE — Telephone Encounter (Signed)
Left message for pt to call back.   Please results of test when she does.  Thanks [employer]

## 2013-03-20 NOTE — Telephone Encounter (Signed)
Will forward to MD for results.  Jazaria Jarecki,CMA  

## 2013-03-20 NOTE — Telephone Encounter (Signed)
Pt would results from mondays lab work

## 2013-03-20 NOTE — Telephone Encounter (Signed)
Serum pregnancy test negative. Please inform patient.

## 2013-04-21 ENCOUNTER — Encounter: Payer: Self-pay | Admitting: Obstetrics and Gynecology

## 2013-04-21 ENCOUNTER — Ambulatory Visit (INDEPENDENT_AMBULATORY_CARE_PROVIDER_SITE_OTHER): Payer: BC Managed Care – PPO | Admitting: Obstetrics and Gynecology

## 2013-04-21 VITALS — BP 127/87 | HR 93 | Temp 97.4°F | Ht 65.0 in | Wt 253.0 lb

## 2013-04-21 DIAGNOSIS — N939 Abnormal uterine and vaginal bleeding, unspecified: Secondary | ICD-10-CM

## 2013-04-21 DIAGNOSIS — N926 Irregular menstruation, unspecified: Secondary | ICD-10-CM

## 2013-04-21 HISTORY — DX: Abnormal uterine and vaginal bleeding, unspecified: N93.9

## 2013-04-21 MED ORDER — NORGESTIMATE-ETH ESTRADIOL 0.25-35 MG-MCG PO TABS: 1.0000 | ORAL_TABLET | Freq: Every day | ORAL | Status: DC

## 2013-04-21 NOTE — Progress Notes (Signed)
Patient ID: Jodi Saunders, female   DOB: 09/23/1970, 42 y.o.   MRN: [id number] 42 yo [id number] s/p endometrial ablation 05/2012 presenting today for evaluation of abnormal uterine bleeding. Patient had IUD removed in June secondary to persistent spotting and was started on depo-provera. Patient states that the injection works well for the first 2 months but then she experiences vaginal bleeding for 4 weeks. Some days are heavier and other days are spotting. Patient desires to change method.  Discussed medical management with OCP. Patient is willing to try this method again before contemplating surgical options. She is aware that hysterectomy will be the definitive treatment. She inquired on robotic surgery as she does not want to be out of work for too long. Informed patient that if she desires robotic surgery to mention it at the time of scheduling her next appointment.

## 2013-05-02 ENCOUNTER — Ambulatory Visit: Payer: BC Managed Care – PPO

## 2013-06-01 ENCOUNTER — Emergency Department (HOSPITAL_BASED_OUTPATIENT_CLINIC_OR_DEPARTMENT_OTHER)
Admission: EM | Admit: 2013-06-01 | Discharge: 2013-06-01 | Disposition: A | Discharge status: Home / Self Care | Payer: BC Managed Care – PPO | Attending: Emergency Medicine | Admitting: Emergency Medicine

## 2013-06-01 ENCOUNTER — Encounter (HOSPITAL_BASED_OUTPATIENT_CLINIC_OR_DEPARTMENT_OTHER): Payer: Self-pay | Admitting: Emergency Medicine

## 2013-06-01 DIAGNOSIS — Z79899 Other long term (current) drug therapy: Secondary | ICD-10-CM | POA: Insufficient documentation

## 2013-06-01 DIAGNOSIS — Z8739 Personal history of other diseases of the musculoskeletal system and connective tissue: Secondary | ICD-10-CM | POA: Insufficient documentation

## 2013-06-01 DIAGNOSIS — D649 Anemia, unspecified: Secondary | ICD-10-CM | POA: Insufficient documentation

## 2013-06-01 DIAGNOSIS — R05 Cough: Secondary | ICD-10-CM | POA: Insufficient documentation

## 2013-06-01 DIAGNOSIS — H9209 Otalgia, unspecified ear: Secondary | ICD-10-CM | POA: Insufficient documentation

## 2013-06-01 DIAGNOSIS — Z91040 Latex allergy status: Secondary | ICD-10-CM | POA: Insufficient documentation

## 2013-06-01 DIAGNOSIS — H9201 Otalgia, right ear: Secondary | ICD-10-CM

## 2013-06-01 DIAGNOSIS — Z8719 Personal history of other diseases of the digestive system: Secondary | ICD-10-CM | POA: Insufficient documentation

## 2013-06-01 DIAGNOSIS — R059 Cough, unspecified: Secondary | ICD-10-CM | POA: Insufficient documentation

## 2013-06-01 MED ORDER — ANTIPYRINE-BENZOCAINE 5.4-1.4 % OT SOLN
3.0000 [drp] | Freq: Once | OTIC | Status: AC
  Administered 2013-06-01: 4 [drp] via OTIC
  Filled 2013-06-01: qty 10

## 2013-06-01 MED ORDER — HYDROCODONE-ACETAMINOPHEN 5-325 MG PO TABS: 1.0000 | ORAL_TABLET | Freq: Four times a day (QID) | ORAL | Status: DC | PRN

## 2013-06-01 NOTE — ED Notes (Signed)
Reports sudden onset left ear pain radiating to jaw x 45 mins

## 2013-06-01 NOTE — ED Provider Notes (Signed)
Medical screening examination/treatment/procedure(s) were performed by non-physician practitioner and as supervising physician I was immediately available for consultation/collaboration.  EKG Interpretation   None         Lyndel Sarate B. Skarlet Lyons, MD 06/01/13 2311 

## 2013-06-01 NOTE — ED Provider Notes (Signed)
CSN: [id number]     Arrival date & time 06/01/13  2002 History   First MD Initiated Contact with Patient 06/01/13 2038     Chief Complaint  Patient presents with  . Otalgia   (Consider location/radiation/quality/duration/timing/severity/associated sxs/prior Treatment) Patient is a 42 y.o. female presenting with ear pain. The history is provided by the patient. No language interpreter was used.  Otalgia Location:  Right Quality:  Pressure and sharp Severity:  Moderate Onset quality:  Sudden Duration:  1 hour Timing:  Constant Progression:  Worsening Chronicity:  New Relieved by:  None tried Ineffective treatments:  None tried Associated symptoms: cough, rhinorrhea and sore throat   Associated symptoms: no hearing loss and no tinnitus     Past Medical History  Diagnosis Date  . Allergy   . Multiple allergies   . Anemia   . Hip pain 08/28/2011  . CHOLELITHIASIS, SYMPTOMATIC 12/28/2009    Qualifier: Diagnosis of  By: Sandi Mealy  MD, Judeth Cornfield    . BACK PAIN 08/05/2009    Qualifier: Diagnosis of  By: Wallene Huh  MD, Rande Lawman    . Tibialis tendinitis 11/19/2007    Qualifier: Diagnosis of  By: Edmon Crape MD, Jonny Ruiz     Past Surgical History  Procedure Laterality Date  . Cholecystectomy  2011  . Tonsillectomy  2008  . Multiple tooth extractions    . Wisdom tooth extraction    . Orif radius & ulna fractures  2002  . Vaginal delivery  1992  . Hysteroscopy with novasure  06/03/2012    Procedure: HYSTEROSCOPY WITH NOVASURE;  Surgeon: Catalina Antigua, MD;  Location: [facility];  Service: Gynecology;  Laterality: N/A;   Family History  Problem Relation Age of Onset  . Diabetes Father   . Hypertension Father   . Diabetes Paternal Grandmother   . Hypertension Paternal Grandmother   . Diabetes Paternal Grandfather   . Hypertension Paternal Grandfather    History  Substance Use Topics  . Smoking status: Never Smoker   . Smokeless tobacco: Never Used  . Alcohol Use: No     Comment: occasionally   OB  History   Grav Para Term Preterm Abortions TAB SAB Ect Mult Living   3 1 1  2 2    1      Review of Systems  HENT: Positive for ear pain, rhinorrhea and sore throat. Negative for hearing loss and tinnitus.   Respiratory: Positive for cough.   All other systems reviewed and are negative.    Allergies  Peanut-containing drug products; Other; Sulfonamide derivatives; and Latex  Home Medications   Current Outpatient Rx  Name  Route  Sig  Dispense  Refill  . cetirizine (ZYRTEC) 10 MG tablet   Oral   Take 10 mg by mouth daily as needed.          . docusate sodium (COLACE) 100 MG capsule   Oral   Take 100 mg by mouth 2 (two) times daily.         Marland Kitchen EPINEPHrine (EPIPEN) 0.3 mg/0.3 mL DEVI   Intramuscular   Inject 0.3 mLs (0.3 mg total) into the muscle once.   1 Device   0   . ferrous sulfate 325 (65 FE) MG tablet      TAKE 1 TABLET THREE TIMES A DAY   90 tablet   6   . ibuprofen (ADVIL,MOTRIN) 600 MG tablet   Oral   Take 1 tablet (600 mg total) by mouth every 6 (six) hours as needed for  pain.   30 tablet   0   . norgestimate-ethinyl estradiol (ORTHO-CYCLEN,SPRINTEC,PREVIFEM) 0.25-35 MG-MCG tablet   Oral   Take 1 tablet by mouth daily.   1 Package   11   . ondansetron (ZOFRAN) 4 MG tablet   Oral   Take 1 tablet (4 mg total) by mouth every 8 (eight) hours as needed for nausea.   30 tablet   0    BP 160/90  Pulse 88  Temp(Src) 99.4 F (37.4 C) (Oral)  Resp 18  Ht 5\' 4"  (1.626 m)  Wt 258 lb (117.028 kg)  BMI 44.26 kg/m2  SpO2 99%  LMP 04/30/2013 Physical Exam  Nursing note and vitals reviewed. Constitutional: She is oriented to person, place, and time. She appears well-developed and well-nourished.  HENT:  Head: Normocephalic.  Right Ear: Hearing normal. No mastoid tenderness. Tympanic membrane is not perforated and not erythematous.  Mouth/Throat: Oropharynx is clear and moist. No oropharyngeal exudate.  Eyes: Conjunctivae are normal. Pupils are  equal, round, and reactive to light.  Neck: Normal range of motion.  Cardiovascular: Normal rate and regular rhythm.   Pulmonary/Chest: Effort normal and breath sounds normal.  Abdominal: Soft. Bowel sounds are normal.  Musculoskeletal: Normal range of motion.  Lymphadenopathy:    She has no cervical adenopathy.  Neurological: She is alert and oriented to person, place, and time.  Skin: Skin is warm and dry.  Psychiatric: She has a normal mood and affect. Her behavior is normal. Judgment and thought content normal.    ED Course  Procedures (including critical care time) Labs Review Labs Reviewed - No data to display Imaging Review No results found.  EKG Interpretation   None       MDM   Otalgia, left ear.  Auralgan, pain medication.  Follow-up with PCP if continued symptoms despite medication.    Jimmye Norman, NP 06/01/13 [phone]

## 2013-06-07 ENCOUNTER — Encounter (HOSPITAL_BASED_OUTPATIENT_CLINIC_OR_DEPARTMENT_OTHER): Payer: Self-pay | Admitting: Emergency Medicine

## 2013-06-07 ENCOUNTER — Emergency Department (HOSPITAL_BASED_OUTPATIENT_CLINIC_OR_DEPARTMENT_OTHER)
Admission: EM | Admit: 2013-06-07 | Discharge: 2013-06-07 | Disposition: A | Discharge status: Home / Self Care | Payer: BC Managed Care – PPO | Attending: Emergency Medicine | Admitting: Emergency Medicine

## 2013-06-07 DIAGNOSIS — Z791 Long term (current) use of non-steroidal anti-inflammatories (NSAID): Secondary | ICD-10-CM | POA: Insufficient documentation

## 2013-06-07 DIAGNOSIS — Z8719 Personal history of other diseases of the digestive system: Secondary | ICD-10-CM | POA: Insufficient documentation

## 2013-06-07 DIAGNOSIS — D649 Anemia, unspecified: Secondary | ICD-10-CM | POA: Insufficient documentation

## 2013-06-07 DIAGNOSIS — Z79899 Other long term (current) drug therapy: Secondary | ICD-10-CM | POA: Insufficient documentation

## 2013-06-07 DIAGNOSIS — J3489 Other specified disorders of nose and nasal sinuses: Secondary | ICD-10-CM | POA: Insufficient documentation

## 2013-06-07 DIAGNOSIS — R42 Dizziness and giddiness: Secondary | ICD-10-CM | POA: Insufficient documentation

## 2013-06-07 DIAGNOSIS — Z8739 Personal history of other diseases of the musculoskeletal system and connective tissue: Secondary | ICD-10-CM | POA: Insufficient documentation

## 2013-06-07 DIAGNOSIS — R0982 Postnasal drip: Secondary | ICD-10-CM | POA: Insufficient documentation

## 2013-06-07 DIAGNOSIS — Z792 Long term (current) use of antibiotics: Secondary | ICD-10-CM | POA: Insufficient documentation

## 2013-06-07 DIAGNOSIS — H6690 Otitis media, unspecified, unspecified ear: Secondary | ICD-10-CM | POA: Insufficient documentation

## 2013-06-07 DIAGNOSIS — Z91040 Latex allergy status: Secondary | ICD-10-CM | POA: Insufficient documentation

## 2013-06-07 DIAGNOSIS — H6692 Otitis media, unspecified, left ear: Secondary | ICD-10-CM

## 2013-06-07 MED ORDER — IBUPROFEN 800 MG PO TABS: 800.0000 mg | ORAL_TABLET | Freq: Three times a day (TID) | ORAL | Status: DC

## 2013-06-07 MED ORDER — AMOXICILLIN 500 MG PO CAPS: 500.0000 mg | ORAL_CAPSULE | Freq: Three times a day (TID) | ORAL | Status: DC

## 2013-06-07 NOTE — ED Notes (Signed)
Pt having left sided ear pain for one week.  Pt seen here for treatment but has not improved since then.

## 2013-06-07 NOTE — ED Provider Notes (Signed)
CSN: [id number]     Arrival date & time 06/07/13  1478 History   First MD Initiated Contact with Patient 06/07/13 [phone]     Chief Complaint  Patient presents with  . Otalgia   (Consider location/radiation/quality/duration/timing/severity/associated sxs/prior Treatment) HPI Comments: Patient presents with a two-week history of worsening left ear pain. She states she was seen here about a week ago and given a prescription for Auralgan drops. At that point it was not felt that it was an infected ear. She's been using Auralgan and drops her pain continued to get worse. She denies any drainage from the ear. She does have some allergy symptoms and nasal drainage. She was previously using Zyrtec but hasn't been using that since the symptoms have been going on. She denies any fevers. She occasional has some dizziness. She denies any significant headache. She denies any shortness of breath or chest congestion. She denies a history of recurrent ear infections.  Patient is a 42 y.o. female presenting with ear pain.  Otalgia Associated symptoms: congestion and rhinorrhea   Associated symptoms: no abdominal pain, no cough, no diarrhea, no fever, no headaches, no hearing loss, no rash, no tinnitus and no vomiting     Past Medical History  Diagnosis Date  . Allergy   . Multiple allergies   . Anemia   . Hip pain 08/28/2011  . CHOLELITHIASIS, SYMPTOMATIC 12/28/2009    Qualifier: Diagnosis of  By: Sandi Mealy  MD, Judeth Cornfield    . BACK PAIN 08/05/2009    Qualifier: Diagnosis of  By: Wallene Huh  MD, Rande Lawman    . Tibialis tendinitis 11/19/2007    Qualifier: Diagnosis of  By: Edmon Crape MD, Jonny Ruiz     Past Surgical History  Procedure Laterality Date  . Cholecystectomy  2011  . Tonsillectomy  2008  . Multiple tooth extractions    . Wisdom tooth extraction    . Orif radius & ulna fractures  2002  . Vaginal delivery  1992  . Hysteroscopy with novasure  06/03/2012    Procedure: HYSTEROSCOPY WITH NOVASURE;  Surgeon: Catalina Antigua,  MD;  Location: [facility];  Service: Gynecology;  Laterality: N/A;   Family History  Problem Relation Age of Onset  . Diabetes Father   . Hypertension Father   . Diabetes Paternal Grandmother   . Hypertension Paternal Grandmother   . Diabetes Paternal Grandfather   . Hypertension Paternal Grandfather    History  Substance Use Topics  . Smoking status: Never Smoker   . Smokeless tobacco: Never Used  . Alcohol Use: No     Comment: occasionally   OB History   Grav Para Term Preterm Abortions TAB SAB Ect Mult Living   3 1 1  2 2    1      Review of Systems  Constitutional: Negative for fever, chills, diaphoresis and fatigue.  HENT: Positive for congestion, ear pain, postnasal drip and rhinorrhea. Negative for hearing loss, sneezing and tinnitus.   Eyes: Negative.   Respiratory: Negative for cough, chest tightness and shortness of breath.   Cardiovascular: Negative for chest pain and leg swelling.  Gastrointestinal: Negative for nausea, vomiting, abdominal pain, diarrhea and blood in stool.  Genitourinary: Negative for frequency, hematuria, flank pain and difficulty urinating.  Musculoskeletal: Negative for arthralgias and back pain.  Skin: Negative for rash.  Neurological: Positive for dizziness. Negative for speech difficulty, weakness, numbness and headaches.    Allergies  Peanut-containing drug products; Other; Sulfonamide derivatives; and Latex  Home Medications   Current Outpatient  Rx  Name  Route  Sig  Dispense  Refill  . amoxicillin (AMOXIL) 500 MG capsule   Oral   Take 1 capsule (500 mg total) by mouth 3 (three) times daily.   30 capsule   0   . cetirizine (ZYRTEC) 10 MG tablet   Oral   Take 10 mg by mouth daily as needed.          . docusate sodium (COLACE) 100 MG capsule   Oral   Take 100 mg by mouth 2 (two) times daily.         Marland Kitchen EPINEPHrine (EPIPEN) 0.3 mg/0.3 mL DEVI   Intramuscular   Inject 0.3 mLs (0.3 mg total) into the muscle once.   1 Device    0   . ferrous sulfate 325 (65 FE) MG tablet      TAKE 1 TABLET THREE TIMES A DAY   90 tablet   6   . HYDROcodone-acetaminophen (NORCO/VICODIN) 5-325 MG per tablet   Oral   Take 1 tablet by mouth every 6 (six) hours as needed for severe pain.   10 tablet   0   . ibuprofen (ADVIL,MOTRIN) 600 MG tablet   Oral   Take 1 tablet (600 mg total) by mouth every 6 (six) hours as needed for pain.   30 tablet   0   . ibuprofen (ADVIL,MOTRIN) 800 MG tablet   Oral   Take 1 tablet (800 mg total) by mouth 3 (three) times daily.   21 tablet   0   . norgestimate-ethinyl estradiol (ORTHO-CYCLEN,SPRINTEC,PREVIFEM) 0.25-35 MG-MCG tablet   Oral   Take 1 tablet by mouth daily.   1 Package   11   . ondansetron (ZOFRAN) 4 MG tablet   Oral   Take 1 tablet (4 mg total) by mouth every 8 (eight) hours as needed for nausea.   30 tablet   0    BP 136/82  Pulse 87  Temp(Src) 98.6 F (37 C) (Oral)  Resp 20  Ht 5\' 4"  (1.626 m)  Wt 258 lb (117.028 kg)  BMI 44.26 kg/m2  SpO2 98%  LMP 04/30/2013 Physical Exam  Constitutional: She is oriented to person, place, and time. She appears well-developed and well-nourished.  HENT:  Head: Normocephalic and atraumatic.  Mouth/Throat: Oropharynx is clear and moist.  Patient has cobblestoning to the posterior pharynx. Her right TM is normal. Her left TM is cloudy and erythematous. There is no evident infection of the canal. There is no pain over the mastoid. There's no trismus.  Eyes: Conjunctivae are normal. Pupils are equal, round, and reactive to light.  Cardiovascular: Normal rate.   Pulmonary/Chest: Effort normal.  Neurological: She is alert and oriented to person, place, and time.  Skin: Skin is warm and dry.    ED Course  Procedures (including critical care time) Labs Review Labs Reviewed - No data to display Imaging Review No results found.  EKG Interpretation   None       MDM   1. Left otitis media    Patient was encouraged to  use her Zyrtec regularly. She was also advised to use Afrin nasal spray for the next 3 days. She was given a prescription for amoxicillin and ibuprofen. She can continue to use Auralgan ear drops as well. She was advised to followup with her primary care physician if her symptoms are not improving.    Rolan Bucco, MD 06/07/13 [phone]

## 2013-06-09 ENCOUNTER — Ambulatory Visit (INDEPENDENT_AMBULATORY_CARE_PROVIDER_SITE_OTHER): Payer: BC Managed Care – PPO | Admitting: Family Medicine

## 2013-06-09 ENCOUNTER — Encounter: Payer: Self-pay | Admitting: Family Medicine

## 2013-06-09 VITALS — BP 135/90 | HR 82 | Temp 98.4°F | Ht 64.0 in | Wt 260.0 lb

## 2013-06-09 DIAGNOSIS — H6590 Unspecified nonsuppurative otitis media, unspecified ear: Secondary | ICD-10-CM

## 2013-06-09 DIAGNOSIS — H9202 Otalgia, left ear: Secondary | ICD-10-CM | POA: Insufficient documentation

## 2013-06-09 DIAGNOSIS — H6592 Unspecified nonsuppurative otitis media, left ear: Secondary | ICD-10-CM

## 2013-06-09 NOTE — Patient Instructions (Addendum)
Ms. Jodi Saunders,  Thank you for coming in to see me today. It appears that you have an inner ear infection plus or minus strep.   You are on appropriate antibiotics, pain medicine, NSAID. Please finish the course of amoxicillin.  If symptoms worsen or are not better by next Monday please call me and I will treat with another antibiotic. If you develop fever please call and make an appt to be re-evaluated.   Dr. Armen Pickup

## 2013-06-09 NOTE — Progress Notes (Signed)
   Subjective:    Patient ID: Jodi Saunders, female    DOB: 28-Dec-1970, 42 y.o.   MRN: [id number]  HPI 42 yo F presents for ED f/u visit. She was seen twice 12/14 and 12/20 for left earache. On 12/20 she was given a 10 day prescription for amoxicillin to treat L otitis media :  1. L earache: Onset: 05/28/13 with scratchy throat, after eating pepper. 12/14 started to have severe pain in L ear. No fever. No sore throat. No history of recurrent L otitis media. S/p tonsillectomy. Taking amoxicillin, ibuprofen and vicodin nightly prn. Denies nasal congestion, trouble swallowing, cough, CP or SOB. Admits to nose bleed and scant bleeding for throat 2 days ago. Sick contacts, coworkers.   Symptoms have improved slightly. Patient was able to sleep through the night last night.   Soc hx: non smoker.   Review of Systems As per HPI     Objective:   Physical Exam BP 135/90  Pulse 82  Temp(Src) 98.4 F (36.9 C) (Oral)  Ht 5\' 4"  (1.626 m)  Wt 260 lb (117.935 kg)  BMI 44.61 kg/m2  LMP 04/30/2013 General appearance: alert, cooperative and no distress Head: Normocephalic, without obvious abnormality, atraumatic Eyes: conjunctivae/corneas clear. PERRL, EOM's intact.  Ears: normal TM and external ear canal right ear and abnormal TM left ear - erythematous, dull and bulging slightly  Nose: Nares normal. Septum midline. Mucosa normal. No drainage or sinus tenderness. Throat: lips, mucosa, and tongue normal; teeth and gums normal Neck: mild anterior cervical adenopathy L side, no carotid bruit, no JVD, supple, symmetrical, trachea midline and thyroid not enlarged, symmetric, no tenderness/mass/nodules Lungs: clear to auscultation bilaterally Heart: regular rate and rhythm, S1, S2 normal, no murmur, click, rub or gallop       Assessment & Plan:

## 2013-06-09 NOTE — Assessment & Plan Note (Signed)
A: L OM with effusion on amoxicillin. Afebrile. Still with pain. P: If fails 10 days of amox will treat with augmentin. Patient will call in 7 days w/ f/u report. If febrile or worsening pain, will need f/u eval and possible CT to rule out complication like mastoiditis.  Continue nsaid and vicodin prn pain.

## 2013-06-16 ENCOUNTER — Telehealth: Payer: Self-pay | Admitting: Family Medicine

## 2013-06-16 ENCOUNTER — Telehealth: Payer: Self-pay | Admitting: *Deleted

## 2013-06-16 DIAGNOSIS — H6592 Unspecified nonsuppurative otitis media, left ear: Secondary | ICD-10-CM

## 2013-06-16 MED ORDER — FLUCONAZOLE 150 MG PO TABS: 150.0000 mg | ORAL_TABLET | Freq: Once | ORAL | Status: DC

## 2013-06-16 MED ORDER — AMOXICILLIN-POT CLAVULANATE 500-125 MG PO TABS: 1.0000 | ORAL_TABLET | Freq: Three times a day (TID) | ORAL | Status: AC

## 2013-06-16 NOTE — Telephone Encounter (Signed)
Opened in error. Samson Ralph,CMA  

## 2013-06-16 NOTE — Telephone Encounter (Signed)
Please inform patient: 1. 10 day course of Augmentin sent in. One tab TID. Diflucan also sent in to prevent yeast infection. She should take this on day 5.  2. If she develops fever or worsening pain she will need f/u appt.   Dr. Armen Pickup will be out of the office from 12/1-12/8, if needed she will f/u with another provider.

## 2013-06-16 NOTE — Telephone Encounter (Signed)
Message given to pt and she is aware of this. Jodi Saunders,CMA

## 2013-06-16 NOTE — Telephone Encounter (Signed)
Pt called because she still has her ear infection and was told to call and let Dr. Armen Pickup know. She either would like more medication or a referral to an ENT. jw

## 2013-06-16 NOTE — Telephone Encounter (Signed)
LMOVM for pt to return call.  Please inform of the below. Me

## 2013-06-16 NOTE — Assessment & Plan Note (Signed)
A: patient called reporting persistent pain. No fever reported. P: 1. 10 day course of Augmentin sent in. 2. If she develops fever or worsening pain she will need f/u appt.  3. If f/u evaluation made and is concerning for mastoiditis will need CT scan ordered.

## 2013-06-23 ENCOUNTER — Telehealth: Payer: Self-pay | Admitting: Family Medicine

## 2013-06-23 DIAGNOSIS — H6592 Unspecified nonsuppurative otitis media, left ear: Secondary | ICD-10-CM

## 2013-06-23 NOTE — Telephone Encounter (Signed)
LMOVM asking pt to return call .Fleeger, Jessica Dawn  

## 2013-06-23 NOTE — Telephone Encounter (Signed)
Patient is still having ear issues even after 2 rounds of antibiotics.  She is needing a referral to an ENT.  Please call.

## 2013-06-23 NOTE — Telephone Encounter (Signed)
BAsed on quick chart review, patient needs to be seen here before ENT can be called.  Treating physician is currently out of town.  No record of ENT referral in prior note.

## 2013-06-27 NOTE — Telephone Encounter (Signed)
Patient evaluated at [name]) on 06/24/2013. No s/s to suggest ear infection. 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  F/u prn.   Called patient. She is feeling better.  Trying not to chew.  Concerned about "insides" regarding taking Vicodin and ibuprofen.

## 2013-07-01 ENCOUNTER — Ambulatory Visit (INDEPENDENT_AMBULATORY_CARE_PROVIDER_SITE_OTHER): Payer: BC Managed Care – PPO | Admitting: Family Medicine

## 2013-07-01 ENCOUNTER — Encounter: Payer: Self-pay | Admitting: Family Medicine

## 2013-07-01 VITALS — BP 144/90 | HR 85 | Ht 64.0 in | Wt 263.0 lb

## 2013-07-01 DIAGNOSIS — H9209 Otalgia, unspecified ear: Secondary | ICD-10-CM

## 2013-07-01 DIAGNOSIS — H9202 Otalgia, left ear: Secondary | ICD-10-CM

## 2013-07-01 DIAGNOSIS — Z23 Encounter for immunization: Secondary | ICD-10-CM

## 2013-07-01 NOTE — Patient Instructions (Signed)
Ms. Harriett Rush,  Thank you for coming in today. Continue your tea. Continue soft foods if your jaw pain recurs.   Follow up with me for your next wellness visit and sooner if needed.  Dr. Adrian Blackwater

## 2013-07-01 NOTE — Progress Notes (Signed)
   Subjective:    Patient ID: Jodi Saunders, female    DOB: [birth year], 43 y.o.   MRN: [id number]  HPI 43 yo F here to f/u L ear pain. Pain improved. Jaw pain also improved. Patient seen at ENT who suspected TMJ and recommended soft diet. Patient with mild R jaw pain last night but is otherwise well. No fever.   HM: due for flu shot.    Review of Systems As per HPI    Objective:   Physical Exam BP 144/90  Pulse 85  Ht 5\' 4"  (1.626 m)  Wt 263 lb (119.296 kg)  BMI 45.12 kg/m2  LMP 06/06/2013 General appearance: alert, cooperative and no distress Head: Normocephalic, without obvious abnormality, atraumatic Ears: normal TM and external ear canal right ear and abnormal TM left ear - superiorly injected. otherwise normal.  Throat: lips, mucosa, and tongue normal; teeth and gums normal Mild TMJ click on R      Assessment & Plan:

## 2013-07-01 NOTE — Assessment & Plan Note (Signed)
Improved and pain free today.  Will resolve problem. F/u prn.

## 2013-07-11 ENCOUNTER — Encounter: Payer: Self-pay | Admitting: Obstetrics and Gynecology

## 2013-07-19 ENCOUNTER — Encounter (HOSPITAL_BASED_OUTPATIENT_CLINIC_OR_DEPARTMENT_OTHER): Payer: Self-pay | Admitting: Emergency Medicine

## 2013-07-19 ENCOUNTER — Emergency Department (HOSPITAL_BASED_OUTPATIENT_CLINIC_OR_DEPARTMENT_OTHER)
Admission: EM | Admit: 2013-07-19 | Discharge: 2013-07-19 | Disposition: A | Discharge status: Home / Self Care | Payer: BC Managed Care – PPO | Attending: Emergency Medicine | Admitting: Emergency Medicine

## 2013-07-19 DIAGNOSIS — N760 Acute vaginitis: Secondary | ICD-10-CM | POA: Insufficient documentation

## 2013-07-19 DIAGNOSIS — Z791 Long term (current) use of non-steroidal anti-inflammatories (NSAID): Secondary | ICD-10-CM | POA: Insufficient documentation

## 2013-07-19 DIAGNOSIS — Z8669 Personal history of other diseases of the nervous system and sense organs: Secondary | ICD-10-CM | POA: Insufficient documentation

## 2013-07-19 DIAGNOSIS — B379 Candidiasis, unspecified: Secondary | ICD-10-CM

## 2013-07-19 DIAGNOSIS — A499 Bacterial infection, unspecified: Secondary | ICD-10-CM | POA: Insufficient documentation

## 2013-07-19 DIAGNOSIS — B9689 Other specified bacterial agents as the cause of diseases classified elsewhere: Secondary | ICD-10-CM | POA: Insufficient documentation

## 2013-07-19 DIAGNOSIS — Z8719 Personal history of other diseases of the digestive system: Secondary | ICD-10-CM | POA: Insufficient documentation

## 2013-07-19 DIAGNOSIS — Z8739 Personal history of other diseases of the musculoskeletal system and connective tissue: Secondary | ICD-10-CM | POA: Insufficient documentation

## 2013-07-19 DIAGNOSIS — Z79899 Other long term (current) drug therapy: Secondary | ICD-10-CM | POA: Insufficient documentation

## 2013-07-19 DIAGNOSIS — D649 Anemia, unspecified: Secondary | ICD-10-CM | POA: Insufficient documentation

## 2013-07-19 LAB — URINALYSIS, ROUTINE W REFLEX MICROSCOPIC
BILIRUBIN URINE: NEGATIVE
Glucose, UA: NEGATIVE mg/dL
Hgb urine dipstick: NEGATIVE
KETONES UR: NEGATIVE mg/dL
NITRITE: NEGATIVE
PH: 6 (ref 5.0–8.0)
PROTEIN: NEGATIVE mg/dL
Specific Gravity, Urine: 1.014 (ref 1.005–1.030)
UROBILINOGEN UA: 0.2 mg/dL (ref 0.0–1.0)

## 2013-07-19 LAB — URINE MICROSCOPIC-ADD ON

## 2013-07-19 LAB — WET PREP, GENITAL
Trich, Wet Prep: NONE SEEN
Yeast Wet Prep HPF POC: NONE SEEN

## 2013-07-19 MED ORDER — LIDOCAINE HCL (PF) 1 % IJ SOLN
INTRAMUSCULAR | Status: AC
  Filled 2013-07-19: qty 5

## 2013-07-19 MED ORDER — FLUCONAZOLE 50 MG PO TABS
150.0000 mg | ORAL_TABLET | Freq: Once | ORAL | Status: AC
  Administered 2013-07-19: 150 mg via ORAL
  Filled 2013-07-19 (×2): qty 1

## 2013-07-19 MED ORDER — CEFTRIAXONE SODIUM 250 MG IJ SOLR
250.0000 mg | Freq: Once | INTRAMUSCULAR | Status: AC
  Administered 2013-07-19: 250 mg via INTRAMUSCULAR
  Filled 2013-07-19: qty 250

## 2013-07-19 MED ORDER — METRONIDAZOLE 500 MG PO TABS: 500.0000 mg | ORAL_TABLET | Freq: Two times a day (BID) | ORAL | Status: DC

## 2013-07-19 MED ORDER — AZITHROMYCIN 250 MG PO TABS
1000.0000 mg | ORAL_TABLET | Freq: Once | ORAL | Status: AC
  Administered 2013-07-19: 1000 mg via ORAL
  Filled 2013-07-19: qty 4

## 2013-07-19 NOTE — ED Notes (Signed)
Pt states that she developed severe itching 1 week ago when cycle started, cycle ended 3 days ago but itching has increased, pt states she feels like there are bumps down

## 2013-07-19 NOTE — ED Notes (Signed)
Pt states that she had emailed her pcp earlier in week, but pt states that they told her not to worry unless she had a foul odor, pt presented to ed tonight because she states she can not stand the itching any longer, no otc used prior to seeking treatment

## 2013-07-19 NOTE — ED Provider Notes (Signed)
CSN: [id number]     Arrival date & time 07/19/13  0350 History   First MD Initiated Contact with Patient 07/19/13 0405     Chief Complaint  Patient presents with  . Vaginal Itching   (Consider location/radiation/quality/duration/timing/severity/associated sxs/prior Treatment) HPI  This is a 43 year old female who presents with vaginal itching. Patient reports onset of symptoms with her period approximately one week ago. She has not noted any vaginal lesions. She denies any vaginal discharge. She denies any recent antibiotic use. Patient denies any new sexual partners. She denies any fevers or abdominal pain. Patient denies any urinary symptoms. Patient has not tried anything for her itching. She's been in contact with her primary care office and was told to followup if her symptoms worsened.  Past Medical History  Diagnosis Date  . Allergy   . Multiple allergies   . Anemia   . Hip pain 08/28/2011  . CHOLELITHIASIS, SYMPTOMATIC 12/28/2009    Qualifier: Diagnosis of  By: [name]  MD, [name]    . BACK PAIN 08/05/2009    Qualifier: Diagnosis of  By: [name]  MD, Hosie Poisson    . Tibialis tendinitis 11/19/2007    Qualifier: Diagnosis of  By: Tustin Desanctis MD, JOHN    . Earache symptoms in left ear 06/09/2013    Completed amox. Completed Augmentin. Still with pain.  Patient evaluated at [name]) on 06/24/2013. No s/s to suggest ear infection. 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  F/u prn.      Past Surgical History  Procedure Laterality Date  . Cholecystectomy  2011  . Tonsillectomy  2008  . Multiple tooth extractions    . Wisdom tooth extraction    . Orif radius & ulna fractures  2002  . Vaginal delivery  1992  . Hysteroscopy with novasure  06/03/2012    Procedure: HYSTEROSCOPY WITH NOVASURE;  Surgeon: [name], MD;  Location: [facility];  Service: Gynecology;  Laterality: N/A;   Family History  Problem Relation Age of Onset  . Diabetes Father   . Hypertension  Father   . Diabetes Paternal Grandmother   . Hypertension Paternal Grandmother   . Diabetes Paternal Grandfather   . Hypertension Paternal Grandfather    History  Substance Use Topics  . Smoking status: Never Smoker   . Smokeless tobacco: Never Used  . Alcohol Use: No     Comment: occasionally   OB History   Grav Para Term Preterm Abortions TAB SAB Ect Mult Living   3 1 1  2 2    1      Review of Systems  Constitutional: Negative for fever.  Gastrointestinal: Negative for nausea, vomiting and abdominal pain.  Genitourinary: Positive for vaginal pain. Negative for dysuria, vaginal bleeding and vaginal discharge.       Vaginal itching  Musculoskeletal: Negative for back pain.  Skin: Negative for wound.  All other systems reviewed and are negative.    Allergies  Peanut-containing drug products; Other; Sulfonamide derivatives; and Latex  Home Medications   Current Outpatient Rx  Name  Route  Sig  Dispense  Refill  . cetirizine (ZYRTEC) 10 MG tablet   Oral   Take 10 mg by mouth daily as needed.          . docusate sodium (COLACE) 100 MG capsule   Oral   Take 100 mg by mouth 2 (two) times daily.         Marland Kitchen EPINEPHrine (EPIPEN) 0.3 mg/0.3 mL  DEVI   Intramuscular   Inject 0.3 mLs (0.3 mg total) into the muscle once.   1 Device   0   . ferrous sulfate 325 (65 FE) MG tablet      TAKE 1 TABLET THREE TIMES A DAY   90 tablet   6   . ibuprofen (ADVIL,MOTRIN) 800 MG tablet   Oral   Take 1 tablet (800 mg total) by mouth 3 (three) times daily.   21 tablet   0   . metroNIDAZOLE (FLAGYL) 500 MG tablet   Oral   Take 1 tablet (500 mg total) by mouth 2 (two) times daily.   20 tablet   0   . norgestimate-ethinyl estradiol (ORTHO-CYCLEN,SPRINTEC,PREVIFEM) 0.25-35 MG-MCG tablet   Oral   Take 1 tablet by mouth daily.   1 Package   11    LMP 07/13/2013 Physical Exam  Nursing note and vitals reviewed. Constitutional: She is oriented to person, place, and time. She  appears well-developed and well-nourished.  HENT:  Head: Normocephalic and atraumatic.  Cardiovascular: Normal rate and regular rhythm.   Pulmonary/Chest: Effort normal. No respiratory distress.  Abdominal: Soft. Bowel sounds are normal. There is no tenderness.  Genitourinary:  External genitalia hyperemic with mild erythema, no lesions noted, normal vaginal vault, thick white discharge noted within the vaginal vault, no adnexal tenderness  Neurological: She is alert and oriented to person, place, and time.  Skin: Skin is warm and dry.  Psychiatric: She has a normal mood and affect.    ED Course  Procedures (including critical care time) Labs Review Labs Reviewed  WET PREP, GENITAL - Abnormal; Notable for the following:    Clue Cells Wet Prep HPF POC FEW (*)    WBC, Wet Prep HPF POC FEW (*)    All other components within normal limits  URINALYSIS, ROUTINE W REFLEX MICROSCOPIC - Abnormal; Notable for the following:    Leukocytes, UA SMALL (*)    All other components within normal limits  GC/CHLAMYDIA PROBE AMP  URINE MICROSCOPIC-ADD ON   Imaging Review No results found.  EKG Interpretation   None       MDM   1. Yeast infection   2. Bacterial vaginosis    Patient presents with isolated vaginal itching. She denies any other complaints. She is nontoxic-appearing on exam. Exam is notable for thick white discharge and hyperemia of the external genitalia. Presentation is consistent with yeast infection. Wet prep, GC and Chlamydia were sent. Patient was given Diflucan, azithromycin, and Rocephin typically cover for STDs as well as a yeast infection.  Wet prep with clue cells. Will treat for bacterial vaginosis as well.  After history, exam, and medical workup I feel the patient has been appropriately medically screened and is safe for discharge home. Pertinent diagnoses were discussed with the patient. Patient was given return precautions.     Merryl Hacker, MD 07/19/13  [phone]

## 2013-07-19 NOTE — Discharge Instructions (Signed)
Candidal Vulvovaginitis Candidal vulvovaginitis is an infection of the vagina and vulva. The vulva is the skin around the opening of the vagina. This may cause itching and discomfort in and around the vagina.  HOME CARE  Only take medicine as told by your doctor.  Do not have sex (intercourse) until the infection is healed or as told by your doctor.  Practice safe sex.  Tell your sex partner about your infection. Do not douche or use tampons.Bacterial Vaginosis Bacterial vaginosis is a vaginal infection that occurs when the normal balance of bacteria in the vagina is disrupted. It results from an overgrowth of certain bacteria. This is the most common vaginal infection in women of childbearing age. Treatment is important to prevent complications, especially in pregnant women, as it can cause a premature delivery. CAUSES  Bacterial vaginosis is caused by an increase in harmful bacteria that are normally present in smaller amounts in the vagina. Several different kinds of bacteria can cause bacterial vaginosis. However, the reason that the condition develops is not fully understood. RISK FACTORS Certain activities or behaviors can put you at an increased risk of developing bacterial vaginosis, including:  Having a new sex partner or multiple sex partners.  Douching.  Using an intrauterine device (IUD) for contraception. Women do not get bacterial vaginosis from toilet seats, bedding, swimming pools, or contact with objects around them. SIGNS AND SYMPTOMS  Some women with bacterial vaginosis have no signs or symptoms. Common symptoms include:  Grey vaginal discharge.  A fishlike odor with discharge, especially after sexual intercourse.  Itching or burning of the vagina and vulva.  Burning or pain with urination. DIAGNOSIS  Your health care provider will take a medical history and examine the vagina for signs of bacterial vaginosis. A sample of vaginal fluid may be taken. Your health  care provider will look at this sample under a microscope to check for bacteria and abnormal cells. A vaginal pH test may also be done.  TREATMENT  Bacterial vaginosis may be treated with antibiotic medicines. These may be given in the form of a pill or a vaginal cream. A second round of antibiotics may be prescribed if the condition comes back after treatment.  HOME CARE INSTRUCTIONS   Only take over-the-counter or prescription medicines as directed by your health care provider.  If antibiotic medicine was prescribed, take it as directed. Make sure you finish it even if you start to feel better.  Do not have sex until treatment is completed.  Tell all sexual partners that you have a vaginal infection. They should see their health care provider and be treated if they have problems, such as a mild rash or itching.  Practice safe sex by using condoms and only having one sex partner. SEEK MEDICAL CARE IF:   Your symptoms are not improving after 3 days of treatment.  You have increased discharge or pain.  You have a fever. MAKE SURE YOU:   Understand these instructions.  Will watch your condition.  Will get help right away if you are not doing well or get worse. FOR MORE INFORMATION  Centers for Disease Control and Prevention, Division of STD Prevention: [employer]raiser[employer]raud[employer] American Sexual Health Association (ASHA): www.ashastd.org  Document Released: 06/05/2005 Document Revised: 03/26/2013 Document Reviewed: 01/15/2013 [facility] Patient Information 2014 [place].    Wear cotton underwear. Do not wear tight pants or panty hose.  Eat yogurt. This may help treat and prevent yeast infections. GET HELP RIGHT AWAY IF:   You have  a fever.  Your problems get worse during treatment or do not get better in 3 days.  You have discomfort, irritation, or itching in your vagina or vulva area.  You have pain after sex.  You start to get belly (abdominal) pain. MAKE SURE  YOU:  Understand these instructions.  Will watch your condition.  Will get help right away if you are not doing well or get worse. Document Released: 09/01/2008 Document Revised: 08/28/2011 Document Reviewed: 09/01/2008 [facility] Patient Information 2014 [place], [place].

## 2013-07-21 LAB — GC/CHLAMYDIA PROBE AMP
CT Probe RNA: NEGATIVE
GC Probe RNA: NEGATIVE

## 2013-07-23 ENCOUNTER — Other Ambulatory Visit: Payer: Self-pay

## 2013-07-23 DIAGNOSIS — Z1231 Encounter for screening mammogram for malignant neoplasm of breast: Secondary | ICD-10-CM

## 2013-08-28 ENCOUNTER — Encounter: Payer: Self-pay | Admitting: Family Medicine

## 2013-08-28 ENCOUNTER — Ambulatory Visit (INDEPENDENT_AMBULATORY_CARE_PROVIDER_SITE_OTHER): Payer: BC Managed Care – PPO | Admitting: Family Medicine

## 2013-08-28 VITALS — BP 130/90 | HR 77 | Temp 99.1°F | Wt 259.0 lb

## 2013-08-28 DIAGNOSIS — E669 Obesity, unspecified: Secondary | ICD-10-CM

## 2013-08-28 DIAGNOSIS — D649 Anemia, unspecified: Secondary | ICD-10-CM

## 2013-08-28 DIAGNOSIS — R030 Elevated blood-pressure reading, without diagnosis of hypertension: Secondary | ICD-10-CM

## 2013-08-28 DIAGNOSIS — IMO0001 Reserved for inherently not codable concepts without codable children: Secondary | ICD-10-CM

## 2013-08-28 LAB — LIPID PANEL
CHOL/HDL RATIO: 2.8 ratio
Cholesterol: 150 mg/dL (ref 0–200)
HDL: 54 mg/dL (ref >39)
LDL Cholesterol: 68 mg/dL (ref 0–99)
Triglycerides: 140 mg/dL (ref <150)
VLDL: 28 mg/dL (ref 0–40)

## 2013-08-28 LAB — CBC
HEMATOCRIT: 33 % — AB (ref 36.0–46.0)
Hemoglobin: 10.6 g/dL — ABNORMAL LOW (ref 12.0–15.0)
MCH: 21 pg — ABNORMAL LOW (ref 26.0–34.0)
MCHC: 32.1 g/dL (ref 30.0–36.0)
MCV: 65.3 fL — AB (ref 78.0–100.0)
PLATELETS: 404 10*3/uL — AB (ref 150–400)
RBC: 5.05 MIL/uL (ref 3.87–5.11)
RDW: 16.5 % — AB (ref 11.5–15.5)
WBC: 8.1 10*3/uL (ref 4.0–10.5)

## 2013-08-28 LAB — COMPREHENSIVE METABOLIC PANEL
ALT: 10 U/L (ref 0–35)
AST: 12 U/L (ref 0–37)
Albumin: 4.1 g/dL (ref 3.5–5.2)
Alkaline Phosphatase: 59 U/L (ref 39–117)
BILIRUBIN TOTAL: 0.5 mg/dL (ref 0.2–1.2)
BUN: 8 mg/dL (ref 6–23)
CALCIUM: 9.3 mg/dL (ref 8.4–10.5)
CO2: 26 mEq/L (ref 19–32)
CREATININE: 0.59 mg/dL (ref 0.50–1.10)
Chloride: 102 mEq/L (ref 96–112)
Glucose, Bld: 106 mg/dL — ABNORMAL HIGH (ref 70–99)
Potassium: 4.1 mEq/L (ref 3.5–5.3)
Sodium: 137 mEq/L (ref 135–145)
Total Protein: 7 g/dL (ref 6.0–8.3)

## 2013-08-28 LAB — TSH: TSH: 1.201 u[IU]/mL (ref 0.350–4.500)

## 2013-08-28 NOTE — Assessment & Plan Note (Signed)
A: elevated. Improved on recheck.  P:  Weight loss. Patient working with Clinical research associate. Referral to medical nutrition. Screening labs: CMP, lipid panel, TSH.

## 2013-08-28 NOTE — Patient Instructions (Signed)
Jodi Saunders,  Thank you for coming in today. I have placed a referral to medical nutrition.  I will be in touch with lab results.  Because of elevated BP, I would like to see you again in 2 months for BP recheck.   Dr. Adrian Blackwater

## 2013-08-28 NOTE — Progress Notes (Signed)
   Subjective:    Patient ID: [name], female    DOB: 1971/01/15, 43 y.o.   MRN: [id number]  HPI 43 yo F presents for f/u visit:  1. Weight: up 13 # from last year. 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 Lost 2 #. Eating well.  2. Elevated BP:  Has HA today. Usually does not have HA. ROS as per below. 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 Taking strides to minimize stress.    Review of Systems Patient Information Form: Screening and ROS  AUDIT-C Score: 1 Do you feel safe in relationships? yes PHQ-2:negative  Review of Systems  General:  Negative for nexplained weight loss, fever Skin: Negative for new or changing mole, sore that won't heal HEENT: Negative for trouble hearing, trouble seeing, ringing in ears, mouth sores, hoarseness, change in voice, dysphagia. CV:  Negative for chest pain, dyspnea, edema, palpitations Resp: Negative for cough, dyspnea, hemoptysis GI: Negative for nausea, vomiting, diarrhea, constipation, abdominal pain, melena, hematochezia. GU: Negative for dysuria, incontinence, urinary hesitance, hematuria, vaginal or penile discharge, polyuria, sexual difficulty, lumps in testicle or breasts MSK: Negative for muscle cramps or aches, joint pain or swelling Neuro: Negative for headaches, weakness, numbness, dizziness, passing out/fainting Psych: Negative for depression, anxiety, memory problems    Objective:   Physical Exam BP 130/90  Pulse 77  Temp(Src) 99.1 F (37.3 C) (Oral)  Wt 259 lb (117.482 kg)  LMP 08/08/2013 Wt Readings from Last 3 Encounters:  08/28/13 259 lb (117.482 kg)  07/01/13 263 lb (119.296 kg)  06/09/13 260 lb (117.935 kg)  weight up 13 # from last year.  General appearance: alert, cooperative, no distress and moderately obese Head: Normocephalic, without obvious abnormality, atraumatic Eyes: conjunctivae/corneas clear. PERRL, EOM's intact. Fundi benign. Ears: normal TM's and  external ear canals both ears Throat: lips, mucosa, and tongue normal; teeth and gums normal Neck: no adenopathy, no carotid bruit, no JVD, supple, symmetrical, trachea midline and thyroid not enlarged, symmetric, no tenderness/mass/nodules Lungs: clear to auscultation bilaterally Heart: regular rate and rhythm, S1, S2 normal, no murmur, click, rub or gallop Abdomen: soft, non-tender; bowel sounds normal; no masses,  no organomegaly Extremities: extremities normal, atraumatic, no cyanosis or edema Neurologic: Alert and oriented X 3, normal strength and tone. Normal symmetric reflexes. Normal gait      Assessment & Plan:

## 2013-08-28 NOTE — Assessment & Plan Note (Signed)
A: declined. Gained over past year.  P: Weight loss. Patient working with Clinical research associate. Referral to medical nutrition. Screening labs: CMP, lipid panel, TSH.

## 2013-09-01 ENCOUNTER — Encounter: Payer: Self-pay | Admitting: Family Medicine

## 2013-09-04 ENCOUNTER — Ambulatory Visit (INDEPENDENT_AMBULATORY_CARE_PROVIDER_SITE_OTHER): Payer: BC Managed Care – PPO | Admitting: Obstetrics & Gynecology

## 2013-09-04 ENCOUNTER — Encounter: Payer: Self-pay | Admitting: Obstetrics & Gynecology

## 2013-09-04 VITALS — BP 120/84 | HR 86 | Temp 97.9°F | Ht 64.0 in | Wt 261.8 lb

## 2013-09-04 DIAGNOSIS — Z01419 Encounter for gynecological examination (general) (routine) without abnormal findings: Secondary | ICD-10-CM

## 2013-09-04 MED ORDER — NORGESTIMATE-ETH ESTRADIOL 0.25-35 MG-MCG PO TABS: 1.0000 | ORAL_TABLET | Freq: Every day | ORAL | Status: DC

## 2013-09-04 NOTE — Progress Notes (Signed)
  Subjective:     Jodi Saunders is a 43 y.o. female here for a routine exam.  Current complaints: none.  Pt is on OC)Ps for dysmenorrhea  She had an ablation, IUD, and depo in the past.  She is best controlled with OCPs.  Pt is followed by FP.  Personal health questionnaire reviewed: yes.   Gynecologic History Patient's last menstrual period was 08/08/2013. Contraception: OCP (estrogen/progesterone) Last Pap: 2014. Results were: ASCUS  Last mammogram: 2014. Results were: normal  Obstetric History OB History  Gravida Para Term Preterm AB SAB TAB Ectopic Multiple Living  3 1 1  2  2   1     # Outcome Date GA Lbr Len/2nd Weight Sex Delivery Anes PTL Lv  3 TRM 04/25/91    F SVD     2 TAB           1 TAB                The following portions of the patient's history were reviewed and updated as appropriate: allergies, current medications, past family history, past medical history, past social history, past surgical history and problem list.  Review of Systems Pertinent items are noted in HPI.    Objective:   Filed Vitals:   09/04/13 1419  BP: 120/84  Pulse: 86  Temp: 97.9 F (36.6 C)  Height: 5\' 4"  (1.626 m)  Weight: 261 lb 12.8 oz (118.752 kg)   Vitals:  WNL General appearance: alert, cooperative and no distress Head: Normocephalic, without obvious abnormality, atraumatic Eyes: negative Throat: lips, mucosa, and tongue normal; teeth and gums normal Lungs: clear to auscultation bilaterally Breasts: normal appearance, no masses or tenderness, No nipple retraction or dimpling, No nipple discharge or bleeding Heart: regular rate and rhythm Abdomen: soft, non-tender; bowel sounds normal; no masses,  no organomegaly  Pelvic:  External Genitalia:  Tanner V, no lesion Urethra:  nml meatus Vagina:  Pink, normal rugae, no blood or discharge Cervix:  No CMT, no lesion Uterus:  Normal size and contour, non tender Adnexa:  Normal, no masses, non tender Rectovaginal Septum:  Non  tender, no masses  Exam limited by habitus  Extremities: no edema, redness or tenderness in the calves or thighs Skin: no lesions or rash Lymph nodes: Axillary adenopathy: none         Assessment:    Healthy female exam.    Plan:    Pap smear today. Mammogram in April Cont OCPs Followed by [facility] for health maintenance.

## 2013-09-12 ENCOUNTER — Encounter: Payer: Self-pay | Admitting: *Deleted

## 2013-09-16 ENCOUNTER — Encounter: Payer: Self-pay | Admitting: Family Medicine

## 2013-09-16 ENCOUNTER — Ambulatory Visit (INDEPENDENT_AMBULATORY_CARE_PROVIDER_SITE_OTHER): Payer: BC Managed Care – PPO | Admitting: Family Medicine

## 2013-09-16 VITALS — Ht 64.0 in | Wt 263.0 lb

## 2013-09-16 DIAGNOSIS — E669 Obesity, unspecified: Secondary | ICD-10-CM

## 2013-09-16 NOTE — Patient Instructions (Addendum)
-   Your iron supplement:  Split your pills between 2 bottles, and keep one in the bathroom.  Make this a routine with brushing your teeth at bedtime.   - Continue to eat at least 3 meals and 1-2 snacks per day.  Eat within the first hour of getting up.  Aim for no more than 5 hours between eating.   - Breakfast should include a protein food.  Any carb food you choose should have at least 5 grams of fiber, i.e., yogurt with high-fiber cereal/nut-free granola and fruit    (berries).   - Obtain twice as many veg's as protein or carbohydrate foods for both lunch and dinner.  - Examples of a lunch that conforms to this format:  Salad with grilled chicken or tuna or beans or OR sandwich with side vegetables (carrots, celery, bell pepper, cucumber, cherry tomato).  Add a piece of fruit to your lunch.   - KEY TO SUCCESS: PLAN AHEAD! - Continue working out at least 3 days a week.   - Bread:  Look for the most fiber content.    - Keep a food daily record, and review to see if you are getting veg's twice a day, and if you are on schedule with your 3 meals a day.  Pay attention to weekends as well.   - Bring your food record to follow-up.   - Incorporating more veg's:  Try using some frozen veg's in soups, chili, spaghetti sauce, pizza, lasagna.

## 2013-09-16 NOTE — Progress Notes (Signed)
Medical Nutrition Therapy:  Appt start time: 1000 end time:  1100.  Assessment:  Primary concerns today: Weight management.  Jodi Saunders has been trying to make helathy food choices, and would like to lose weight.  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  She lives alone until her daughter gets home from college after graduating this spring.   Usual eating pattern includes 2-3 meals and 2-3 snacks per day. Usual physical activity includes personal trainer 2-3 X wk; also tries to build in walking/stairs in everyday activities.  Frequent foods include water, hot tea in winter w/ honey, chx, veg's, potatoes a few X wk, salads.  Avoided foods include pecans and almonds (makes face feel numb), spicy foods, esp black pepper (throat swells), milk (lactose intol).    24-hr recall: (Up at 6:30 AM) B (8:30 AM)-   Water, fruit cup, Lance cinnamon roll breakfast biscuit (230kcal)  Snk ( AM)-   none L (1:30 PM)-  3 [employer] chx tenders, 3/4 c [place] fries, 1/2 pc [place] toast, water Snk (2:30)-  12 oz root beer D (6:15 PM)-  [employer] Happy Meal: dbl chsbgr, small fries, 12 oz Hi-C Snk ( PM)-  1/2 c grapefruit cup, water Yesterday's meals were not very typical.  Shuntia does not usually go to [employer], nor does she eat [place] fries very often.    Progress Towards Goal(s):  In progress.   Nutritional Diagnosis:  NI-1.5 Excessive energy intake As related to expenditure.  As evidenced by BMI >44.    Intervention:  Nutrition education.  Monitoring/Evaluation:  Dietary intake, exercise, and body weight in 4 week(s).

## 2013-09-19 ENCOUNTER — Other Ambulatory Visit: Payer: Self-pay | Admitting: Family Medicine

## 2013-09-22 ENCOUNTER — Ambulatory Visit: Payer: BC Managed Care – PPO

## 2013-10-14 ENCOUNTER — Ambulatory Visit (INDEPENDENT_AMBULATORY_CARE_PROVIDER_SITE_OTHER): Payer: BC Managed Care – PPO | Admitting: Family Medicine

## 2013-10-14 ENCOUNTER — Encounter: Payer: Self-pay | Admitting: Family Medicine

## 2013-10-14 VITALS — Ht 64.0 in | Wt 261.1 lb

## 2013-10-14 DIAGNOSIS — E669 Obesity, unspecified: Secondary | ICD-10-CM

## 2013-10-14 NOTE — Patient Instructions (Signed)
-   Continue with same food goals:    1. Eat at least 3 meals and 1-2 snacks per day.  Aim for no more than 5 hours between eating.   2. Vegetables twice a day:  Aim for twice as many veg's as protein or carbohydrate foods for both lunch and dinner.  3. Iron supplement twice a day.     - Move your toothbrush after use in the morning so that you have to see your iron pills as you use the toothbrush.     - Think about what system you can devise to help you remember the morning iron pill.    - Practice mindful eating. - Emotional eating:  Remember the three Ds:  1. Delay  2. Distract  3. Distance from the trigger or from the food source   - De-convenience junk foods; keep them in the highest cupboard or under everything else in the freezer, in opaque containers.   - Develop a written list of activities you can do instead of eating.    - Remember HAALT:  Hungry, Anxious, Angry, Lonely, Tired (and Bored or Depressed) - Use the Food Decisions Algorithm (handout) when you find yourself struggling with a food decision.

## 2013-10-14 NOTE — Progress Notes (Signed)
Medical Nutrition Therapy:  Appt start time: 1000 end time:  1100.  Assessment:  Primary concerns today: Weight management.  Jodi Saunders has had a trying last couple of weeks with more work than usual, participating in a wedding, helping a friend who was hospitalized.  Consequently, she did not exercise as much as usual, but she has been eating breakfast regularly, more veg's and getting protein at most meals.  Mid-day snacks included 1/2 pb and honey sandwich, which has been helpful in sustaining her till lunch time.  Weigh is down almost 2 lb.   We discussed emotional eating today.  [name] believes getting a handle on this will be helpful to her overall diet.    24-hr recall:  (Up at 6:30 AM) B (8:30 AM)-  1 pc mushrm, blk olive pizza, 1 c Naked Juice (170 kcal) Snk ( AM)-  none L (3 PM)-  2 slc mushrm, blk olive pizza, entree salad, 1 oz cheese, 2 tbsp dressing Snk ( PM)-  none D ( PM)-  none; got home from work ~7:45 PM Snk ( PM)-  none Yesterday was atypical in that she did not eat dinner.    Progress Towards Goal(s):  In progress.   Nutritional Diagnosis:  Some progress noted on NI-1.5 Excessive energy intake As related to expenditure.  As evidenced by weight loss of ~2 lb.    Intervention:  Nutrition education.  Monitoring/Evaluation:  Dietary intake, exercise, and body weight in 7 week(s).

## 2013-10-28 ENCOUNTER — Encounter: Payer: Self-pay | Admitting: Family Medicine

## 2013-10-28 ENCOUNTER — Ambulatory Visit (INDEPENDENT_AMBULATORY_CARE_PROVIDER_SITE_OTHER): Payer: BC Managed Care – PPO | Admitting: Family Medicine

## 2013-10-28 VITALS — BP 163/96 | HR 90 | Temp 99.1°F | Wt 264.0 lb

## 2013-10-28 DIAGNOSIS — R209 Unspecified disturbances of skin sensation: Secondary | ICD-10-CM

## 2013-10-28 DIAGNOSIS — R202 Paresthesia of skin: Secondary | ICD-10-CM

## 2013-10-28 DIAGNOSIS — R030 Elevated blood-pressure reading, without diagnosis of hypertension: Secondary | ICD-10-CM

## 2013-10-28 DIAGNOSIS — IMO0001 Reserved for inherently not codable concepts without codable children: Secondary | ICD-10-CM

## 2013-10-28 DIAGNOSIS — R200 Anesthesia of skin: Secondary | ICD-10-CM | POA: Insufficient documentation

## 2013-10-28 MED ORDER — HYDROCHLOROTHIAZIDE 12.5 MG PO TABS: 12.5000 mg | ORAL_TABLET | Freq: Every day | ORAL | Status: DC
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
## 2013-10-28 NOTE — Assessment & Plan Note (Addendum)
A: HTN P: Please start HCTZ for elevated blood pressure. Once daily with or without food.  You can also take it at night if that is easier to remember. F/u in 4-6 weeks for recheck and repeat BMP.

## 2013-10-28 NOTE — Patient Instructions (Signed)
Jodi Saunders,  Thank you for coming in today.  Please start HCTZ for elevated blood pressure. Once daily with or without food.  You can also take it at night if that is easier to remember.  I will make a note of your thumb.  F/u in 4-6 weeks for recheck and repeat BMP.  Dr. Adrian Blackwater

## 2013-10-28 NOTE — Assessment & Plan Note (Signed)
A: intermittent pain and tingling concering for neuropathy. No s/s of carpal tunnel. Patient not interested in medication.  P: F/u prn Patient considering nerve conduction test, asked about "nerve doctor". We discuss that the test is painful.

## 2013-11-24 ENCOUNTER — Encounter: Payer: Self-pay | Admitting: Family Medicine

## 2013-11-27 ENCOUNTER — Encounter: Payer: Self-pay | Admitting: Obstetrics and Gynecology

## 2013-12-02 ENCOUNTER — Encounter: Payer: Self-pay | Admitting: Family Medicine

## 2013-12-02 ENCOUNTER — Ambulatory Visit (INDEPENDENT_AMBULATORY_CARE_PROVIDER_SITE_OTHER): Payer: BC Managed Care – PPO | Admitting: Family Medicine

## 2013-12-02 VITALS — BP 134/86 | HR 84 | Temp 98.2°F | Wt 268.0 lb

## 2013-12-02 DIAGNOSIS — I10 Essential (primary) hypertension: Secondary | ICD-10-CM

## 2013-12-02 NOTE — Progress Notes (Signed)
   Subjective:    Patient ID: Jodi Saunders, female    DOB: August 17, 1970, 43 y.o.   MRN: [id number] CC: f/u HTN  HPI 43 yo F presents for HTN f/u. Since last OV she has been compliant with HCTZ. She denies HA, CP, SOB, LE edema. Would like a trial off HCTZ as her life situation has become less stressful.    Soc hx: non-smoker  Review of Systems As per HPI     Objective:   Physical Exam BP 134/86  Pulse 84  Temp(Src) 98.2 F (36.8 C) (Oral)  Wt 268 lb (121.564 kg)  LMP 11/27/2013 General appearance: alert, cooperative and no distress Head: Normocephalic, without obvious abnormality, atraumatic Lungs: clear to auscultation bilaterally Heart: regular rate and rhythm, S1, S2 normal, no murmur, click, rub or gallop     Assessment & Plan:

## 2013-12-02 NOTE — Assessment & Plan Note (Signed)
A: well controlled HTN on HCTZ 12.5 mg daily. Patient reports decreased stress. Would like to do a trial off medication. P:  D/c HCTZ 12.5 mg daily Home BP monitoring, goal < 140/90 Restart HCTZ for BP > 140/90 for two consecutive days.  Call if BP above goal so medication can be placed back on med list.  F/u in 4-6 weeks to meet new MD.

## 2013-12-02 NOTE — Patient Instructions (Signed)
[place],  Thank you for coming in today.  Your blood pressure is at goal.  Stop HCTZ 12.5 mg daily.   Home BP monitoring, goal < 140/90 Restart HCTZ for BP > 140/90 for two consecutive days.  Call if BP above goal so medication can be placed back on med list.   F/u in 4-6 weeks to meet new MD. It has been a pleasure taking care of you.   Dr. Adrian Blackwater

## 2013-12-07 ENCOUNTER — Emergency Department (HOSPITAL_BASED_OUTPATIENT_CLINIC_OR_DEPARTMENT_OTHER): Payer: BC Managed Care – PPO

## 2013-12-07 ENCOUNTER — Emergency Department (HOSPITAL_BASED_OUTPATIENT_CLINIC_OR_DEPARTMENT_OTHER)
Admission: EM | Admit: 2013-12-07 | Discharge: 2013-12-07 | Disposition: A | Discharge status: Home / Self Care | Payer: BC Managed Care – PPO | Attending: Emergency Medicine | Admitting: Emergency Medicine

## 2013-12-07 ENCOUNTER — Encounter (HOSPITAL_BASED_OUTPATIENT_CLINIC_OR_DEPARTMENT_OTHER): Payer: Self-pay | Admitting: Emergency Medicine

## 2013-12-07 DIAGNOSIS — Z862 Personal history of diseases of the blood and blood-forming organs and certain disorders involving the immune mechanism: Secondary | ICD-10-CM | POA: Insufficient documentation

## 2013-12-07 DIAGNOSIS — IMO0002 Reserved for concepts with insufficient information to code with codable children: Secondary | ICD-10-CM | POA: Insufficient documentation

## 2013-12-07 DIAGNOSIS — Z8742 Personal history of other diseases of the female genital tract: Secondary | ICD-10-CM | POA: Insufficient documentation

## 2013-12-07 DIAGNOSIS — Z8669 Personal history of other diseases of the nervous system and sense organs: Secondary | ICD-10-CM | POA: Insufficient documentation

## 2013-12-07 DIAGNOSIS — Y9389 Activity, other specified: Secondary | ICD-10-CM | POA: Insufficient documentation

## 2013-12-07 DIAGNOSIS — Z8719 Personal history of other diseases of the digestive system: Secondary | ICD-10-CM | POA: Insufficient documentation

## 2013-12-07 DIAGNOSIS — Z8739 Personal history of other diseases of the musculoskeletal system and connective tissue: Secondary | ICD-10-CM | POA: Insufficient documentation

## 2013-12-07 DIAGNOSIS — Z91040 Latex allergy status: Secondary | ICD-10-CM | POA: Insufficient documentation

## 2013-12-07 DIAGNOSIS — S63501A Unspecified sprain of right wrist, initial encounter: Secondary | ICD-10-CM

## 2013-12-07 DIAGNOSIS — S63509A Unspecified sprain of unspecified wrist, initial encounter: Secondary | ICD-10-CM | POA: Insufficient documentation

## 2013-12-07 DIAGNOSIS — Z791 Long term (current) use of non-steroidal anti-inflammatories (NSAID): Secondary | ICD-10-CM | POA: Insufficient documentation

## 2013-12-07 DIAGNOSIS — Y9289 Other specified places as the place of occurrence of the external cause: Secondary | ICD-10-CM | POA: Insufficient documentation

## 2013-12-07 DIAGNOSIS — Z79899 Other long term (current) drug therapy: Secondary | ICD-10-CM | POA: Insufficient documentation

## 2013-12-07 MED ORDER — IBUPROFEN 800 MG PO TABS: 800.0000 mg | ORAL_TABLET | Freq: Three times a day (TID) | ORAL | Status: DC

## 2013-12-07 NOTE — ED Provider Notes (Signed)
CSN: [id number]     Arrival date & time 12/07/13  2109 History   First MD Initiated Contact with Patient 12/07/13 2224     Chief Complaint  Patient presents with  . Wrist Pain     (Consider location/radiation/quality/duration/timing/severity/associated sxs/prior Treatment) Patient is a 43 y.o. female presenting with wrist pain. The history is provided by the patient. No language interpreter was used.  Wrist Pain This is a new problem. Episode onset: 3 weeks. The problem occurs constantly. The problem has been gradually worsening. Associated symptoms include joint swelling and myalgias. Nothing aggravates the symptoms. She has tried nothing for the symptoms. The treatment provided no relief.  Pt reports she hit wrist 3 weeks ago.   Pt reports increased swelling and pain   Past Medical History  Diagnosis Date  . Allergy   . Multiple allergies   . Anemia   . Hip pain 08/28/2011  . CHOLELITHIASIS, SYMPTOMATIC 12/28/2009    Qualifier: Diagnosis of  By: [name]  MD, [name]    . BACK PAIN 08/05/2009    Qualifier: Diagnosis of  By: [name]  MD, Hosie Poisson    . Tibialis tendinitis 11/19/2007    Qualifier: Diagnosis of  By: [place] Desanctis MD, JOHN    . Earache symptoms in left ear 06/09/2013    Completed amox. Completed Augmentin. Still with pain.  Patient evaluated at [name]) on 06/24/2013. No s/s to suggest ear infection. 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  F/u prn.     . Abnormal uterine bleeding 04/21/2013   Past Surgical History  Procedure Laterality Date  . Cholecystectomy  2011  . Tonsillectomy  2008  . Multiple tooth extractions    . Wisdom tooth extraction    . Orif radius & ulna fractures  2002  . Vaginal delivery  1992  . Hysteroscopy with novasure  06/03/2012    Procedure: HYSTEROSCOPY WITH NOVASURE;  Surgeon: [name], MD;  Location: [facility];  Service: Gynecology;  Laterality: N/A;   Family History  Problem Relation Age of Onset  . Diabetes Father    . Hypertension Father   . Diabetes Paternal Grandmother   . Hypertension Paternal Grandmother   . Diabetes Paternal Grandfather   . Hypertension Paternal Grandfather    History  Substance Use Topics  . Smoking status: Never Smoker   . Smokeless tobacco: Never Used  . Alcohol Use: Yes     Comment: occasionally   OB History   Grav Para Term Preterm Abortions TAB SAB Ect Mult Living   3 1 1  2 2    1      Review of Systems  Musculoskeletal: Positive for joint swelling and myalgias.  All other systems reviewed and are negative.     Allergies  Peanut-containing drug products; Other; Sulfonamide derivatives; and Latex  Home Medications   Prior to Admission medications   Medication Sig Start Date End Date Taking? Authorizing Provider  cetirizine (ZYRTEC) 10 MG tablet Take 10 mg by mouth daily as needed.    Yes Historical Provider, MD  [facility] IRON 325 (65 FE) MG tablet TAKE 1 TABLET THREE TIMES A DAY   Yes Josalyn C Funches, MD  docusate sodium (COLACE) 100 MG capsule Take 100 mg by mouth daily as needed.    Yes Historical Provider, MD  EPINEPHrine (EPIPEN) 0.3 mg/0.3 mL DEVI Inject 0.3 mLs (0.3 mg total) into the muscle once. 08/28/11  Yes Judithann Sheen, MD  ibuprofen (ADVIL,MOTRIN) 800  MG tablet Take 1 tablet (800 mg total) by mouth 3 (three) times daily. 06/07/13  Yes Malvin Johns, MD  norgestimate-ethinyl estradiol (ORTHO-CYCLEN,SPRINTEC,PREVIFEM) 0.25-35 MG-MCG tablet Take 1 tablet by mouth daily. 09/04/13  Yes Guss Bunde, MD   BP 139/93  Pulse 87  Temp(Src) 97.9 F (36.6 C) (Oral)  Resp 18  Ht 5\' 4"  (1.626 m)  Wt 260 lb (117.935 kg)  BMI 44.61 kg/m2  SpO2 99%  LMP 11/27/2013 Physical Exam  Nursing note and vitals reviewed. Constitutional: She is oriented to person, place, and time. She appears well-developed and well-nourished.  Musculoskeletal: She exhibits tenderness.  Tender right wrist,  Slight swelling,   Pain with range of motion  nv and ns intact   Neurological: She is alert and oriented to person, place, and time. She has normal reflexes.  Skin: Skin is warm.  Psychiatric: She has a normal mood and affect.    ED Course  Procedures (including critical care time) Labs Review Labs Reviewed - No data to display  Imaging Review Dg Forearm Right  12/07/2013   CLINICAL DATA:  Right arm and wrist pain for 3 weeks.  No injury.  EXAM: RIGHT FOREARM - 2 VIEW  COMPARISON:  None.  FINDINGS: Right radius and ulna appear intact without evidence of any displaced fracture or focal bone lesion. The scaphoid bone appears to be deformed although this may just be due to positioning. Given the history of wrist pain, right wrist views are recommended for best evaluation.  IMPRESSION: No displaced fractures demonstrated in the right radius or ulna. Incomplete visualization of right wrist. Right wrist views recommended.   Electronically Signed   By: Lucienne Capers M.D.   On: 12/07/2013 22:13     EKG Interpretation None      MDM   Final diagnoses:  Sprain of wrist, right, initial encounter    Ibuprofen Follow up with your orthopaedist Wrist splint   Fransico Meadow, PA-C 12/07/13 2252

## 2013-12-07 NOTE — ED Notes (Signed)
Pt reports intermittent right wrist pain for 3 weeks - states that it has been constant since 6/8 - reports she noticed it initially when she was working out.

## 2013-12-07 NOTE — Discharge Instructions (Signed)

## 2013-12-08 ENCOUNTER — Ambulatory Visit (INDEPENDENT_AMBULATORY_CARE_PROVIDER_SITE_OTHER): Payer: BC Managed Care – PPO | Admitting: Family Medicine

## 2013-12-08 ENCOUNTER — Encounter: Payer: Self-pay | Admitting: Family Medicine

## 2013-12-08 VITALS — Ht 64.0 in | Wt 266.2 lb

## 2013-12-08 DIAGNOSIS — E669 Obesity, unspecified: Secondary | ICD-10-CM

## 2013-12-08 NOTE — Patient Instructions (Addendum)
-   Try your breakfast squares made with 1/2 lb of sausage.  Also, you may want to look for another recipe that uses eggs, milk, and a little flour in place of Bisquick.    - Usually more whole, real foods (vs processed) are going to be better for you nutritionally and for weight loss.) - Yogurt for breakfast: Try using a little less yogurt and Grapenuts, and adding berries.    - Lunch and dinner: Remember to obtain twice the volume of veg's as either starch or protein foods.    - For busy days, be sure to plan ahead, and bring some fruit and/or veg's.    - Be sure to eat at least every 5 hours.    - Keep a food record for at least the next two weeks.    - Food record review every 2-4 days:       1. Did I eat every 5 hours?     2. Did I have a generous amount of veg's at both lunch and dinner?     3. Did I have protein with each meal?     4. What was the main driver of my food choices?  (Use your food decisions algorithm!)

## 2013-12-08 NOTE — Progress Notes (Signed)
Medical Nutrition Therapy:  Appt start time: 1000 end time:  1100.  Assessment:  Primary concerns today: Weight management.  Jodi Saunders has been taking her Fe regularly, 2-3 X day.  She is eating breakfast daily.  She has made an effort to improve the quality of her snacks, and has been exercising 3 X wk:  Walking or classes at the gym. Just diagnosed with R wrist sprain last night, which she think may have happened with exercise.  She is trying a month of not taking HCTZ to see if it has been related to her recent weight gain.  I think keeping and reviewing a food record will be helpful to [name] staying on track with her food choices.    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  Usual breakfast is 2 breakfast squares (browned sausage, potato, bisquick, cheese, egg, milk), 1 c yogurt w/ 1/4 c Grapenuts.  Lunch is usually a [place] & chs sandwich w/ veg/fruit, occasionally chips.  Dinner is usually similar to lunch.    Progress Towards Goal(s):  In progress.   Nutritional Diagnosis:  Some progress noted on NI-1.5 Excessive energy intake As related to expenditure.  As evidenced by weight loss of ~2 lb.    Intervention:  Nutrition education.  Monitoring/Evaluation:  Dietary intake, exercise, and body weight in 7 week(s).

## 2013-12-08 NOTE — ED Provider Notes (Signed)
Medical screening examination/treatment/procedure(s) were performed by non-physician practitioner and as supervising physician I was immediately available for consultation/collaboration.   EKG Interpretation None        Ezequiel Essex, MD 12/08/13 0025

## 2013-12-18 ENCOUNTER — Encounter: Payer: Self-pay | Admitting: Family Medicine

## 2014-01-12 ENCOUNTER — Ambulatory Visit: Payer: BC Managed Care – PPO | Admitting: Family Medicine

## 2014-01-13 ENCOUNTER — Ambulatory Visit: Payer: BC Managed Care – PPO | Admitting: Family Medicine

## 2014-04-16 ENCOUNTER — Other Ambulatory Visit: Payer: Self-pay | Admitting: Family Medicine

## 2014-04-20 ENCOUNTER — Encounter: Payer: Self-pay | Admitting: Family Medicine

## 2014-05-12 ENCOUNTER — Other Ambulatory Visit: Payer: Self-pay | Admitting: Family Medicine

## 2014-05-29 ENCOUNTER — Other Ambulatory Visit: Payer: Self-pay | Admitting: Family Medicine

## 2014-09-03 ENCOUNTER — Other Ambulatory Visit: Payer: Self-pay | Admitting: Obstetrics & Gynecology

## 2014-09-03 NOTE — Telephone Encounter (Signed)
Patient needs appt for yearly exam.

## 2014-09-29 ENCOUNTER — Encounter: Payer: Self-pay | Admitting: Family Medicine

## 2014-09-29 ENCOUNTER — Ambulatory Visit (INDEPENDENT_AMBULATORY_CARE_PROVIDER_SITE_OTHER): Payer: BC Managed Care – PPO | Admitting: Family Medicine

## 2014-09-29 VITALS — BP 140/89 | HR 83 | Temp 98.3°F | Ht 64.0 in | Wt 271.8 lb

## 2014-09-29 DIAGNOSIS — Z0000 Encounter for general adult medical examination without abnormal findings: Secondary | ICD-10-CM

## 2014-09-29 DIAGNOSIS — Z789 Other specified health status: Secondary | ICD-10-CM

## 2014-09-29 DIAGNOSIS — I10 Essential (primary) hypertension: Secondary | ICD-10-CM

## 2014-09-29 DIAGNOSIS — D649 Anemia, unspecified: Secondary | ICD-10-CM | POA: Diagnosis not present

## 2014-09-29 DIAGNOSIS — E669 Obesity, unspecified: Secondary | ICD-10-CM | POA: Diagnosis not present

## 2014-09-29 LAB — BASIC METABOLIC PANEL
BUN: 7 mg/dL (ref 6–23)
CHLORIDE: 102 meq/L (ref 96–112)
CO2: 24 meq/L (ref 19–32)
Calcium: 9.1 mg/dL (ref 8.4–10.5)
Creat: 0.64 mg/dL (ref 0.50–1.10)
Glucose, Bld: 87 mg/dL (ref 70–99)
Potassium: 4.4 mEq/L (ref 3.5–5.3)
SODIUM: 137 meq/L (ref 135–145)

## 2014-09-29 LAB — LIPID PANEL
CHOL/HDL RATIO: 2.6 ratio
CHOLESTEROL: 152 mg/dL (ref 0–200)
HDL: 58 mg/dL (ref >=46)
LDL Cholesterol: 72 mg/dL (ref 0–99)
TRIGLYCERIDES: 110 mg/dL (ref <150)
VLDL: 22 mg/dL (ref 0–40)

## 2014-09-29 LAB — FERRITIN: Ferritin: 140 ng/mL (ref 10–291)

## 2014-09-29 LAB — CBC
HCT: 31 % — ABNORMAL LOW (ref 36.0–46.0)
Hemoglobin: 9.8 g/dL — ABNORMAL LOW (ref 12.0–15.0)
MCH: 21 pg — ABNORMAL LOW (ref 26.0–34.0)
MCHC: 31.6 g/dL (ref 30.0–36.0)
MCV: 66.5 fL — ABNORMAL LOW (ref 78.0–100.0)
MPV: 10.3 fL (ref 8.6–12.4)
PLATELETS: 416 10*3/uL — AB (ref 150–400)
RBC: 4.66 MIL/uL (ref 3.87–5.11)
RDW: 16.2 % — ABNORMAL HIGH (ref 11.5–15.5)
WBC: 9.8 10*3/uL (ref 4.0–10.5)

## 2014-09-29 NOTE — Progress Notes (Signed)
   Subjective:    Patient ID: Jodi Saunders, female    DOB: Jul 24, 1970, 44 y.o.   MRN: [id number]  HPI  Jodi Saunders is here for physical.   She is still having heavy bleeding monthly despite being on OCP.  She is followed by an OB for this.  She has had a mammogram a year or two ago. Denies any first degree relatives with breast cancer.  She has had some weight gain since two years ago. She admits having stressful life situation and changing careers.  She is at a stable point in her life now and is working to losing the weight.   Mission trip: she is going to [place] for a church mission [place]. She has been there before in 2014. [place] village [place] 1 km from [place]. She received her last vaccines from the health department prior to the last trip.   Elevated BP: she has been on medication before but taken off as she had stressful situation going on in her life. Doesn't want to start medications currently.    Current Outpatient Prescriptions on File Prior to Visit  Medication Sig Dispense Refill  . cetirizine (ZYRTEC) 10 MG tablet Take 10 mg by mouth daily as needed.     . [facility] IRON 325 (65 FE) MG tablet TAKE 1 TABLET THREE TIMES A DAY 90 tablet 5  . docusate sodium (COLACE) 100 MG capsule Take 100 mg by mouth daily as needed.     Marland Kitchen EPINEPHrine (EPIPEN) 0.3 mg/0.3 mL DEVI Inject 0.3 mLs (0.3 mg total) into the muscle once. 1 Device 0  . EPIPEN 2-PAK 0.3 MG/0.3ML SOAJ injection USE AS DIRECTED 2 Device 0  . ibuprofen (ADVIL,MOTRIN) 800 MG tablet Take 1 tablet (800 mg total) by mouth 3 (three) times daily. 21 tablet 0  . ibuprofen (ADVIL,MOTRIN) 800 MG tablet Take 1 tablet (800 mg total) by mouth 3 (three) times daily. 21 tablet 0  . SPRINTEC 28 0.25-35 MG-MCG tablet TAKE 1 TABLET BY MOUTH DAILY. 28 tablet 3   No current facility-administered medications on file prior to visit.    SHx: has one daughter that is going to law school   Health Maintenance: Tdap  Review of  Systems See HPI     Objective:   Physical Exam BP 140/89 mmHg  Pulse 83  Temp(Src) 98.3 F (36.8 C) (Oral)  Ht 5\' 4"  (1.626 m)  Wt 271 lb 12.8 oz (123.288 kg)  BMI 46.63 kg/m2  LMP 09/03/2014 Gen: NAD, alert, cooperative with exam, well-appearing HEENT: NCAT, PERRL, clear conjunctiva, oropharynx clear, supple neck CV: RRR, good S1/S2, no murmur, no edema, capillary refill brisk  Resp: CTABL, no wheezes, non-labored Abd: SNTND, BS present, no guarding or organomegaly Skin: no rashes, normal turgor  Neuro: no gross deficits.        Assessment & Plan:

## 2014-09-29 NOTE — Patient Instructions (Signed)
Thank you for coming in,   I will place a referral to the Health department so you may get your vaccines before you trip if one is needed.   I will call you with the results from today.   Follow up in 6 months.   Please bring all of your medications with you to each visit.    Please feel free to call with any questions or concerns at any time, at [phone]. --Dr. Raeford Razor

## 2014-09-30 ENCOUNTER — Encounter: Payer: Self-pay | Admitting: Family Medicine

## 2014-09-30 DIAGNOSIS — Z789 Other specified health status: Secondary | ICD-10-CM | POA: Insufficient documentation

## 2014-09-30 DIAGNOSIS — E669 Obesity, unspecified: Secondary | ICD-10-CM | POA: Insufficient documentation

## 2014-09-30 DIAGNOSIS — Z0000 Encounter for general adult medical examination without abnormal findings: Secondary | ICD-10-CM | POA: Insufficient documentation

## 2014-09-30 MED ORDER — FERROUS SULFATE 325 (65 FE) MG PO TABS: 325.0000 mg | ORAL_TABLET | Freq: Three times a day (TID) | ORAL | Status: DC

## 2014-09-30 NOTE — Assessment & Plan Note (Signed)
Thought to be secondary to her heavy bleeding and beta thalessemia trait.  - ferritin normal  - has been on iron before but has quit taking.

## 2014-09-30 NOTE — Assessment & Plan Note (Signed)
Traveling to [place] for [employer] trip. Needs vaccines prior to trip which our clinic doesn't cover.  - Referral to Health Dept. Travel clinic  - Script placed up front for patient and will be scanned into chart.

## 2014-09-30 NOTE — Assessment & Plan Note (Signed)
Will follow-up in 6 months

## 2014-09-30 NOTE — Assessment & Plan Note (Signed)
Looks well on exam. Overweight but is going to lose weight. Has had mammogram.  - CBC, BMP, Lipid panel

## 2014-10-01 ENCOUNTER — Telehealth: Payer: Self-pay | Admitting: Family Medicine

## 2014-10-01 ENCOUNTER — Encounter: Payer: Self-pay | Admitting: Family Medicine

## 2014-10-01 NOTE — Telephone Encounter (Signed)
Left voicemail for patient.  If she calls back and wants to speak with a nurse then the nurse can inform her about her lab results.   Her ferritin (an indicator for Iron def anemia) is normal but her MCV is low. This suggests that her beta thalassemia trait is contributing to her anemia.  She can continue to take the iron pills. We will monitor her hgb levels every so often such as when she follows up for her blood pressure.   Her lipid panel doesn't suggest that she needs a statin.  Her BMP is normal.   Rosemarie Ax, MD PGY-2, [facility] 10/01/2014, 9:08 AM

## 2014-10-05 ENCOUNTER — Encounter: Payer: Self-pay | Admitting: Obstetrics & Gynecology

## 2014-10-05 ENCOUNTER — Ambulatory Visit (INDEPENDENT_AMBULATORY_CARE_PROVIDER_SITE_OTHER): Payer: BC Managed Care – PPO | Admitting: Obstetrics & Gynecology

## 2014-10-05 VITALS — BP 146/91 | HR 91 | Temp 98.4°F | Ht 64.0 in | Wt 272.7 lb

## 2014-10-05 DIAGNOSIS — Z3041 Encounter for surveillance of contraceptive pills: Secondary | ICD-10-CM | POA: Diagnosis not present

## 2014-10-05 DIAGNOSIS — Z0000 Encounter for general adult medical examination without abnormal findings: Secondary | ICD-10-CM

## 2014-10-05 DIAGNOSIS — Z1239 Encounter for other screening for malignant neoplasm of breast: Secondary | ICD-10-CM

## 2014-10-05 MED ORDER — NORGESTIMATE-ETH ESTRADIOL 0.25-35 MG-MCG PO TABS: 1.0000 | ORAL_TABLET | Freq: Every day | ORAL | Status: DC

## 2014-10-05 NOTE — Patient Instructions (Signed)
Mammography Mammography is an X-ray of the breasts to look for changes that are not normal. The X-ray image is called a mammogram. This procedure can screen for breast cancer, can detect cancer early, and can diagnose cancer.  LET YOUR CAREGIVER KNOW ABOUT:  Breast implants.  Previous breast disease, biopsy, or surgery.  If you are breastfeeding.  Medicines taken, including vitamins, herbs, eyedrops, over-the-counter medicines, and creams.  Use of steroids (by mouth or creams).  Possibility of pregnancy, if this applies. RISKS AND COMPLICATIONS  Exposure to radiation, but at very low levels.  The results may be misinterpreted.  The results may not be accurate.  Mammography may lead to further tests.  Mammography may not catch certain cancers. BEFORE THE PROCEDURE  Schedule your test about 7 days after your menstrual period. This is when your breasts are the least tender and have signs of hormone changes.  If you have had a mammography done at a different facility in the past, get the mammogram X-rays or have them sent to your current exam facility in order to compare them.  Wash your breasts and under your arms the day of the test.  Do not wear deodorants, perfumes, or powders anywhere on your body.  Wear clothes that you can change in and out of easily. PROCEDURE Relax as much as possible during the test. Any discomfort during the test will be very brief. The test should take less than 30 minutes. The following will happen:  You will undress from the waist up and put on a gown.  You will stand in front of the X-ray machine.  Each breast will be placed between 2 plastic or glass plates. The plates will compress your breast for a few seconds.  X-rays will be taken from different angles of the breast. AFTER THE PROCEDURE  The mammogram will be examined.  Depending on the quality of the images, you may need to repeat certain parts of the test.  Ask when your test  results will be ready. Make sure you get your test results.  You may resume normal activities. Document Released: 06/02/2000 Document Revised: 08/28/2011 Document Reviewed: 03/26/2011 ExitCare Patient Information 2015 [employer]. This information is not intended to replace advice given to you by your health care provider. Make sure you discuss any questions you have with your health care provider.  

## 2014-10-05 NOTE — Progress Notes (Signed)
Patient ID: Jodi Saunders, female   DOB: [birth year], 44 y.o.   MRN: [id number]  Chief Complaint  Patient presents with  . Gynecologic Exam    HPI Jodi Saunders is a 44 y.o. female.  [id number] Patient's last menstrual period was 09/03/2014. Regular menses on OCP due to menorrhagia with normal flow now, will continue with tx.  HPI  Past Medical History  Diagnosis Date  . Allergy   . Multiple allergies   . Anemia   . Hip pain 08/28/2011  . CHOLELITHIASIS, SYMPTOMATIC 12/28/2009    Qualifier: Diagnosis of  By: [name]  MD, [name]    . BACK PAIN 08/05/2009    Qualifier: Diagnosis of  By: [name]  MD, Hosie Poisson    . Tibialis tendinitis 11/19/2007    Qualifier: Diagnosis of  By:  Desanctis MD, JOHN    . Earache symptoms in left ear 06/09/2013    Completed amox. Completed Augmentin. Still with pain.  Patient evaluated at [name] on 06/24/2013. No s/s to suggest ear infection. 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  F/u prn.     . Abnormal uterine bleeding 04/21/2013    Past Surgical History  Procedure Laterality Date  . Cholecystectomy  2011  . Tonsillectomy  2008  . Multiple tooth extractions    . Wisdom tooth extraction    . Orif radius & ulna fractures  2002  . Vaginal delivery  1992  . Hysteroscopy with novasure  06/03/2012    Procedure: HYSTEROSCOPY WITH NOVASURE;  Surgeon: [name], MD;  Location: [facility];  Service: Gynecology;  Laterality: N/A;    Family History  Problem Relation Age of Onset  . Diabetes Father   . Hypertension Father   . Diabetes Paternal Grandmother   . Hypertension Paternal Grandmother   . Diabetes Paternal Grandfather   . Hypertension Paternal Grandfather     Social History History  Substance Use Topics  . Smoking status: Never Smoker   . Smokeless tobacco: Never Used  . Alcohol Use: Yes     Comment: occasionally    Allergies  Allergen Reactions  . Peanut-Containing Drug Products     Facial swelling  . Other      Tree nuts  . Sulfonamide Derivatives Nausea And Vomiting    REACTION: nausea, lightheaded  . Latex Rash    Blisters    Current Outpatient Prescriptions  Medication Sig Dispense Refill  . cetirizine (ZYRTEC) 10 MG tablet Take 10 mg by mouth daily as needed.     . docusate sodium (COLACE) 100 MG capsule Take 100 mg by mouth daily as needed.     Marland Kitchen EPINEPHrine (EPIPEN) 0.3 mg/0.3 mL DEVI Inject 0.3 mLs (0.3 mg total) into the muscle once. 1 Device 0  . EPIPEN 2-PAK 0.3 MG/0.3ML SOAJ injection USE AS DIRECTED 2 Device 0  . ferrous sulfate (CVS IRON) 325 (65 FE) MG tablet Take 1 tablet (325 mg total) by mouth 3 (three) times daily. 90 tablet 1  . norgestimate-ethinyl estradiol (SPRINTEC 28) 0.25-35 MG-MCG tablet Take 1 tablet by mouth daily. 28 tablet 12  . ibuprofen (ADVIL,MOTRIN) 800 MG tablet Take 1 tablet (800 mg total) by mouth 3 (three) times daily. (Patient not taking: Reported on 09/29/2014) 21 tablet 0  . ibuprofen (ADVIL,MOTRIN) 800 MG tablet Take 1 tablet (800 mg total) by mouth 3 (three) times daily. (Patient not taking: Reported on 09/29/2014) 21 tablet 0   No current facility-administered medications for this visit.  Review of Systems Review of Systems  Constitutional: Negative.   Genitourinary: Negative for dysuria, vaginal bleeding, vaginal discharge and menstrual problem.    Blood pressure 146/91, pulse 91, temperature 98.4 F (36.9 C), temperature source Oral, height 5\' 4"  (1.626 m), weight 272 lb 11.2 oz (123.696 kg), last menstrual period 09/03/2014.  Physical Exam Physical Exam  Constitutional: She is oriented to person, place, and time. She appears well-developed. No distress.  obese  Cardiovascular: Normal rate.   Pulmonary/Chest: Effort normal. No respiratory distress.  Breasts: breasts appear normal, no suspicious masses, no skin or nipple changes or axillary nodes.   Abdominal: Soft. She exhibits no distension and no mass. There is no tenderness.   Genitourinary:  Deferred does not need pap test  Neurological: She is alert and oriented to person, place, and time.  Skin: Skin is warm and dry.  Psychiatric: She has a normal mood and affect. Her behavior is normal.    Data Reviewed Pap and [place] result  Assessment    Well woman exam, pelvic not done     Plan    Mammogram, refill OCP        ARNOLD,JAMES 10/05/2014, 4:54 PM

## 2014-11-11 ENCOUNTER — Other Ambulatory Visit: Payer: Self-pay | Admitting: Obstetrics & Gynecology

## 2014-11-11 ENCOUNTER — Ambulatory Visit (HOSPITAL_COMMUNITY)
Admission: RE | Admit: 2014-11-11 | Discharge: 2014-11-11 | Disposition: A | Discharge status: Home / Self Care | Payer: BC Managed Care – PPO | Source: Ambulatory Visit | Attending: Obstetrics & Gynecology | Admitting: Obstetrics & Gynecology

## 2014-11-11 DIAGNOSIS — Z1239 Encounter for other screening for malignant neoplasm of breast: Secondary | ICD-10-CM

## 2014-11-11 DIAGNOSIS — Z1231 Encounter for screening mammogram for malignant neoplasm of breast: Secondary | ICD-10-CM | POA: Diagnosis not present

## 2014-12-17 ENCOUNTER — Other Ambulatory Visit: Payer: Self-pay | Admitting: *Deleted

## 2014-12-17 DIAGNOSIS — D649 Anemia, unspecified: Secondary | ICD-10-CM

## 2014-12-18 MED ORDER — FERROUS SULFATE 325 (65 FE) MG PO TABS: 325.0000 mg | ORAL_TABLET | Freq: Three times a day (TID) | ORAL | Status: DC

## 2015-01-10 ENCOUNTER — Emergency Department (HOSPITAL_BASED_OUTPATIENT_CLINIC_OR_DEPARTMENT_OTHER)
Admission: EM | Admit: 2015-01-10 | Discharge: 2015-01-10 | Disposition: A | Discharge status: Home / Self Care | Payer: BC Managed Care – PPO | Attending: Emergency Medicine | Admitting: Emergency Medicine

## 2015-01-10 ENCOUNTER — Encounter (HOSPITAL_BASED_OUTPATIENT_CLINIC_OR_DEPARTMENT_OTHER): Payer: Self-pay

## 2015-01-10 ENCOUNTER — Emergency Department (HOSPITAL_BASED_OUTPATIENT_CLINIC_OR_DEPARTMENT_OTHER): Payer: BC Managed Care – PPO

## 2015-01-10 DIAGNOSIS — Y9389 Activity, other specified: Secondary | ICD-10-CM | POA: Diagnosis not present

## 2015-01-10 DIAGNOSIS — Z79899 Other long term (current) drug therapy: Secondary | ICD-10-CM | POA: Diagnosis not present

## 2015-01-10 DIAGNOSIS — Z91040 Latex allergy status: Secondary | ICD-10-CM | POA: Insufficient documentation

## 2015-01-10 DIAGNOSIS — Y998 Other external cause status: Secondary | ICD-10-CM | POA: Diagnosis not present

## 2015-01-10 DIAGNOSIS — S0990XA Unspecified injury of head, initial encounter: Secondary | ICD-10-CM | POA: Insufficient documentation

## 2015-01-10 DIAGNOSIS — R42 Dizziness and giddiness: Secondary | ICD-10-CM | POA: Diagnosis not present

## 2015-01-10 DIAGNOSIS — Z793 Long term (current) use of hormonal contraceptives: Secondary | ICD-10-CM | POA: Insufficient documentation

## 2015-01-10 DIAGNOSIS — S3992XA Unspecified injury of lower back, initial encounter: Secondary | ICD-10-CM | POA: Insufficient documentation

## 2015-01-10 DIAGNOSIS — Y92410 Unspecified street and highway as the place of occurrence of the external cause: Secondary | ICD-10-CM | POA: Diagnosis not present

## 2015-01-10 DIAGNOSIS — R110 Nausea: Secondary | ICD-10-CM

## 2015-01-10 LAB — URINALYSIS, ROUTINE W REFLEX MICROSCOPIC
Bilirubin Urine: NEGATIVE
GLUCOSE, UA: NEGATIVE mg/dL
HGB URINE DIPSTICK: NEGATIVE
KETONES UR: NEGATIVE mg/dL
Leukocytes, UA: NEGATIVE
Nitrite: NEGATIVE
PH: 6 (ref 5.0–8.0)
Protein, ur: NEGATIVE mg/dL
Specific Gravity, Urine: 1.012 (ref 1.005–1.030)
Urobilinogen, UA: 0.2 mg/dL (ref 0.0–1.0)

## 2015-01-10 MED ORDER — ONDANSETRON HCL 4 MG PO TABS
4.0000 mg | ORAL_TABLET | Freq: Four times a day (QID) | ORAL | Status: DC
Start: 2015-01-10 — End: 2015-03-11

## 2015-01-10 MED ORDER — ONDANSETRON 4 MG PO TBDP
4.0000 mg | ORAL_TABLET | Freq: Once | ORAL | Status: AC
  Administered 2015-01-10: 4 mg via ORAL
  Filled 2015-01-10: qty 1

## 2015-01-10 NOTE — Discharge Instructions (Signed)
Return to the ED with any concerns including vomiting, changes in vision, seizure activity, difficulty breathing, vomiting and not able to keep down liquids, decreased level of alertness/lethargy, or any other alarming symptoms
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
## 2015-03-11 ENCOUNTER — Ambulatory Visit (INDEPENDENT_AMBULATORY_CARE_PROVIDER_SITE_OTHER): Payer: BC Managed Care – PPO | Admitting: Podiatry

## 2015-03-11 ENCOUNTER — Encounter: Payer: Self-pay | Admitting: Podiatry

## 2015-03-11 VITALS — BP 142/82 | HR 82 | Resp 12

## 2015-03-11 DIAGNOSIS — L600 Ingrowing nail: Secondary | ICD-10-CM

## 2015-03-11 MED ORDER — NEOMYCIN-POLYMYXIN-HC 3.5-10000-1 OT SOLN
OTIC | Status: DC
Start: 2015-03-11 — End: 2015-06-04

## 2015-03-11 NOTE — Progress Notes (Signed)
She presents today with a chief complaint of a painful hallux nail right. She states it has been bothering her for the past several months and seems to be getting worse particularly with shoe gear that is enclosed. She states that the nail seems to be getting thicker. She states her daughter had a nail removed by this and had a removed and did very well.  Objective: Vital signs are stable she is alert and oriented 3. She is in no acute distress. Pulses are strongly palpable bilateral. Neurologic sensorium is intact per Semmes-Weinstein monofilament. Deep tendon reflexes are intact bilateral muscle strength +5 over 5 dorsiflexion plantar flexors and inverters everters all intrinsic musculature is intact. Orthopedic evaluation demonstrates all joints distal to the ankle range of motion without crepitation. She has mild pes planus. Cutaneous evaluation demonstrates supple well-hydrated cutis no erythema edema saline as drainage or odor sharp incurvated nail with onychocryptosis pain on palpation of this nail. Nail appears to be thickened but possibly mycotic but definitely dystrophic.  Assessment: Onychomycosis with ingrown nail hallux right.  Plan: Discussed etiology pathology conservative versus surgical therapies. We performed a total nail avulsion today. She was tolerated this procedure well after local and aesthetic was administered. She was given both oral and written home-going instructions for the care and soaking of the toe which she will start tomorrow. We also prescribed Cortisporin Otic to apply to the nail bed as the new nail grows out. I will follow-up with her in 1 week for reevaluation. We did not sample the nail since she was the nail to grow back.  Roselind Messier DPM

## 2015-03-11 NOTE — Patient Instructions (Signed)

## 2015-03-18 DIAGNOSIS — M79673 Pain in unspecified foot: Secondary | ICD-10-CM

## 2015-03-23 ENCOUNTER — Ambulatory Visit (INDEPENDENT_AMBULATORY_CARE_PROVIDER_SITE_OTHER): Payer: BC Managed Care – PPO | Admitting: Podiatry

## 2015-03-23 DIAGNOSIS — L600 Ingrowing nail: Secondary | ICD-10-CM

## 2015-03-23 NOTE — Patient Instructions (Signed)

## 2015-03-23 NOTE — Progress Notes (Signed)
She presents today for 1 week postop matrixectomy hallux right. She denies fever chills nausea vomiting muscle aches and pains. States that she continues to soak in Betadine and warm water.  Objective: Vital signs are stable she is alert and oriented 3 there is no erythema edema saline as drainage or odor margins appear to be healing very well.  Assessment: Well-healed surgical toe hallux right.  Plan: Discontinue Betadine spell with Epsom salts and warm water soaks coated in the daily open at bedtime. Continue soaks until completely resolved. Follow-up with me with questions or concerns.

## 2015-04-30 ENCOUNTER — Other Ambulatory Visit: Payer: Self-pay | Admitting: *Deleted

## 2015-04-30 DIAGNOSIS — D649 Anemia, unspecified: Secondary | ICD-10-CM

## 2015-04-30 MED ORDER — FERROUS SULFATE 325 (65 FE) MG PO TABS: 325.0000 mg | ORAL_TABLET | Freq: Three times a day (TID) | ORAL | Status: DC

## 2015-06-01 ENCOUNTER — Other Ambulatory Visit: Payer: Self-pay | Admitting: *Deleted

## 2015-06-01 DIAGNOSIS — D649 Anemia, unspecified: Secondary | ICD-10-CM

## 2015-06-01 MED ORDER — FERROUS SULFATE 325 (65 FE) MG PO TABS: 325.0000 mg | ORAL_TABLET | Freq: Three times a day (TID) | ORAL | Status: DC

## 2015-06-04 ENCOUNTER — Encounter: Payer: Self-pay | Admitting: Obstetrics and Gynecology

## 2015-06-04 ENCOUNTER — Ambulatory Visit (INDEPENDENT_AMBULATORY_CARE_PROVIDER_SITE_OTHER): Payer: BC Managed Care – PPO | Admitting: Obstetrics and Gynecology

## 2015-06-04 ENCOUNTER — Other Ambulatory Visit (HOSPITAL_COMMUNITY)
Admission: RE | Admit: 2015-06-04 | Discharge: 2015-06-04 | Disposition: A | Discharge status: Home / Self Care | Payer: BC Managed Care – PPO | Source: Ambulatory Visit | Attending: Obstetrics and Gynecology | Admitting: Obstetrics and Gynecology

## 2015-06-04 VITALS — BP 157/87 | HR 71 | Temp 98.3°F | Ht 64.0 in | Wt 272.3 lb

## 2015-06-04 DIAGNOSIS — N938 Other specified abnormal uterine and vaginal bleeding: Secondary | ICD-10-CM

## 2015-06-04 DIAGNOSIS — Z3202 Encounter for pregnancy test, result negative: Secondary | ICD-10-CM | POA: Diagnosis not present

## 2015-06-04 DIAGNOSIS — G8918 Other acute postprocedural pain: Secondary | ICD-10-CM | POA: Diagnosis not present

## 2015-06-04 DIAGNOSIS — Z01812 Encounter for preprocedural laboratory examination: Secondary | ICD-10-CM

## 2015-06-04 LAB — POCT PREGNANCY, URINE: Preg Test, Ur: NEGATIVE

## 2015-06-04 MED ORDER — IBUPROFEN 200 MG PO TABS: 600.0000 mg | ORAL_TABLET | Freq: Once | ORAL | Status: DC

## 2015-06-04 NOTE — Progress Notes (Signed)
Patient ID: Jodi Saunders, female   DOB: 1970/12/07, 44 y.o.   MRN: [id number] 44 yo G3P1021 presenting today for the evaluation of vaginal bleeding. Patient had an endometrial biopsy performed in 2013. Her vaginal bleeding had been well controlled with OCPs until the month of October where she experienced breakthrough vaginal bleeding throughout the pack. She has since stopped her OCPs as she thought it was not working anymore. Patient is without any other complaints. She describes the bleeding as heavy with passage of clots.  Past Medical History  Diagnosis Date  . Allergy   . Multiple allergies   . Anemia   . Hip pain 08/28/2011  . CHOLELITHIASIS, SYMPTOMATIC 12/28/2009    Qualifier: Diagnosis of  By: [name]  MD, [name]    . BACK PAIN 08/05/2009    Qualifier: Diagnosis of  By: [name]  MD, Hosie Poisson    . Tibialis tendinitis 11/19/2007    Qualifier: Diagnosis of  By: [place] Desanctis MD, JOHN    . Earache symptoms in left ear 06/09/2013    Completed amox. Completed Augmentin. Still with pain.  Patient evaluated at [name]) on 06/24/2013. No s/s to suggest ear infection. 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  F/u prn.     . Abnormal uterine bleeding 04/21/2013   Past Surgical History  Procedure Laterality Date  . Cholecystectomy  2011  . Tonsillectomy  2008  . Multiple tooth extractions    . Wisdom tooth extraction    . Orif radius & ulna fractures  2002  . Vaginal delivery  1992  . Hysteroscopy with novasure  06/03/2012    Procedure: HYSTEROSCOPY WITH NOVASURE;  Surgeon: [name], MD;  Location: [facility];  Service: Gynecology;  Laterality: N/A;   Family History  Problem Relation Age of Onset  . Diabetes Father   . Hypertension Father   . Diabetes Paternal Grandmother   . Hypertension Paternal Grandmother   . Diabetes Paternal Grandfather   . Hypertension Paternal Grandfather    Social History  Substance Use Topics  . Smoking status: Never Smoker   . Smokeless  tobacco: Never Used  . Alcohol Use: Yes     Comment: occasionally   ROS See pertinent in HPI  Blood pressure 157/87, pulse 71, temperature 98.3 F (36.8 C), height 5\' 4"  (1.626 m), weight 272 lb 4.8 oz (123.514 kg), last menstrual period 05/06/2015. GENERAL: Well-developed, well-nourished female in no acute distress.  ABDOMEN: Soft, nontender, nondistended. No organomegaly. PELVIC: Normal external female genitalia. Vagina is pink and rugated.  Normal discharge. Normal appearing cervix. Uterus is normal in size. No adnexal mass or tenderness. EXTREMITIES: No cyanosis, clubbing, or edema, 2+ distal pulses.  A/P 44 yo with DUB x 1 episode - Discussed repeating endometrial biopsy - Discussed continuation of OCP to see if DUB will persist. - Advised to keep a menstrual calendar - ENDOMETRIAL BIOPSY     The indications for endometrial biopsy were reviewed.   Risks of the biopsy including cramping, bleeding, infection, uterine perforation, inadequate specimen and need for additional procedures  were discussed. The patient states she understands and agrees to undergo procedure today. Consent was signed. Time out was performed. Urine HCG was negative. A sterile speculum was placed in the patient's vagina and the cervix was prepped with Betadine. A single-toothed tenaculum was placed on the anterior lip of the cervix to stabilize it. The uterine cavity was sounded to a depth of 8 cm using the uterine sound.  The 3 mm pipelle was introduced into the endometrial cavity without difficulty, 2 passes were made.  A  scant amount of tissue was  sent to pathology. The instruments were removed from the patient's vagina. Minimal bleeding from the cervix was noted. The patient tolerated the procedure well.  Routine post-procedure instructions were given to the patient. The patient will follow up in two weeks to review the results and for further management.

## 2015-06-04 NOTE — Progress Notes (Signed)
States is taking sprintec and was in middle of pill pack in November and passed a lot of clots. Then bled for 14 days. Had ablation in 06/03/12. Still having bleeding, started sprintec and it worked will until last month.

## 2015-06-08 ENCOUNTER — Telehealth: Payer: Self-pay | Admitting: *Deleted

## 2015-06-08 NOTE — Telephone Encounter (Signed)
Per message request from Dr. Elly Modena called patient and notifed her of negative endometrial biopsy. Dennis voices understanding and denied any questions.

## 2015-08-18 ENCOUNTER — Encounter: Payer: Self-pay | Admitting: *Deleted

## 2015-09-23 ENCOUNTER — Encounter: Payer: Self-pay | Admitting: Allergy and Immunology

## 2015-09-23 ENCOUNTER — Ambulatory Visit (INDEPENDENT_AMBULATORY_CARE_PROVIDER_SITE_OTHER): Payer: BC Managed Care – PPO | Admitting: Allergy and Immunology

## 2015-09-23 VITALS — BP 140/90 | HR 80 | Temp 98.5°F | Resp 15 | Ht 64.76 in | Wt 270.9 lb

## 2015-09-23 DIAGNOSIS — R05 Cough: Secondary | ICD-10-CM

## 2015-09-23 DIAGNOSIS — H1010 Acute atopic conjunctivitis, unspecified eye: Secondary | ICD-10-CM | POA: Diagnosis not present

## 2015-09-23 DIAGNOSIS — R059 Cough, unspecified: Secondary | ICD-10-CM

## 2015-09-23 DIAGNOSIS — J309 Allergic rhinitis, unspecified: Secondary | ICD-10-CM

## 2015-09-23 MED ORDER — MONTELUKAST SODIUM 10 MG PO TABS: 10.0000 mg | ORAL_TABLET | Freq: Every evening | ORAL | Status: DC

## 2015-09-23 MED ORDER — LEVOCETIRIZINE DIHYDROCHLORIDE 5 MG PO TABS: ORAL_TABLET | ORAL | Status: DC

## 2015-09-23 NOTE — Progress Notes (Signed)
NEW PATIENT NOTE  RE: [name] MRN: [id number] DOB: April 09, 1971 [facility] [place] [address] [place] [address] Date of Office Visit: 09/23/2015    I had the pleasure of seeing Jodi Saunders today in initial evaluation as you recall-- Subjective:  Jodi Saunders is a 45 y.o. female who presents today for Allergies  Assessment:   1. Allergic rhinoconjunctivitis.   2. Cough.   3.      Negative, selective food testing today. Plan:   Meds ordered this encounter  Medications  . levocetirizine (XYZAL) 5 MG tablet    Sig: One tablet by mouth once daily for runny nose or itching.    Dispense:  30 tablet    Refill:  5  . montelukast (SINGULAIR) 10 MG tablet    Sig: Take 1 tablet (10 mg total) by mouth every evening.    Dispense:  30 tablet    Refill:  5   Patient Instructions  1. Avoidance: Mite, Mold and Pollen 2. Antihistamine: Xyzal 5mg  by mouth once daily for runny nose or itching. 3. Nasal Spray: Nasacort 2 spray(s) each nostril once daily for stuffy nose or drainage.  4. Singulair 10mg  each evening. 5. Consider allergy injections--written information given today and consider selected labs at [facility]. 6. Nasal Saline wash each evening at shower time 7. Follow up Visit: 2 months or sooner if needed.  HPI: Jodi Saunders presents to the office in Self-referral, given previous history of allergic rhinoconjunctivitis and hives evaluated previously in our office last 2012.  She describes year-round rhinorrhea, congestion, sneezing, itchy watery eyes, postnasal drip, cough and phlegm production.  Typically recalls increasing symptoms with fluctuant weather patterns, especially spring, pollen, dust, mold, animal dander, outdoors and strong perfumes/odor/cigarette smoke exposure.  She is interested in re-testing environmental aeroallergens and selected foods as she typically thought peanuts, nuts was related to previous difficulty of itching/hives.  She has not had  any recent episodes of hives, itching, swelling, but eats peanut butter in small quantities, tending to avoid cashew, almond and black pepper.  No history of GE reflux, recent sinus infections or hospitalizations.  No nocturnal/exercise induced difficulty or recent labs, chest x-ray or sinus CT scan  She has used several antihistamines and nasal sprays of  questionable benefit.  Denies ED or Urgent care visits, prednisone or antibiotic courses.  Medical History: Past Medical History  Diagnosis Date  . Allergy   . Multiple allergies   . Anemia   . Hip pain 08/28/2011  . CHOLELITHIASIS, SYMPTOMATIC 12/28/2009    Qualifier: Diagnosis of  By: [name]  MD, [name]    . BACK PAIN 08/05/2009    Qualifier: Diagnosis of  By: [name]  MD, Hosie Poisson    . Tibialis tendinitis 11/19/2007    Qualifier: Diagnosis of  By: [name] MD, JOHN    . Earache symptoms in left ear 06/09/2013    Completed amox. Completed Augmentin. Still with pain.  Patient evaluated at [name] on 06/24/2013. No s/s to suggest ear infection. 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  F/u prn.     . Abnormal uterine bleeding 04/21/2013   Surgical History: Past Surgical History  Procedure Laterality Date  . Cholecystectomy  2011  . Tonsillectomy  2008  . Multiple tooth extractions    . Wisdom tooth extraction    . Orif radius & ulna fractures  2002  . Vaginal delivery  1992  . Hysteroscopy with novasure  06/03/2012    Procedure: HYSTEROSCOPY WITH NOVASURE;  Surgeon: [name], MD;  Location: [facility];  Service: Gynecology;  Laterality: N/A;   Family History: Family History  Problem Relation Age of Onset  . Diabetes Father   . Hypertension Father   . Diabetes Paternal Grandmother   . Hypertension Paternal Grandmother   . Diabetes Paternal Grandfather   . Hypertension Paternal Grandfather   . Allergic rhinitis Neg Hx   . Angioedema Neg Hx   . Asthma Neg Hx   . Atopy Neg Hx   . Eczema Neg Hx   .  Immunodeficiency Neg Hx   . Urticaria Neg Hx    Social History: Social History  . Marital Status: Single    Spouse Name: N/A  . Number of Children: one  . Years of Education: N/A   Social History Main Topics  . Smoking status: Never Smoker   . Smokeless tobacco: Never Used  . Alcohol Use: Yes     Comment: occasionally  . Drug Use: No  . 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    Jodi Saunders has a current medication list which includes the following prescription(s): cetirizine, docusate sodium, epipen 2-pak, ibuprofen, norgestimate-ethinyl estradiol, and ferrous sulfate.   Drug Allergies: Allergies  Allergen Reactions  . Peanut-Containing Drug Products     Facial swelling  . Other     Tree nuts  . Sulfonamide Derivatives Nausea And Vomiting    REACTION: nausea, lightheaded  . [employer] adhesive Rash    Blisters    Environmental History: [place] lives in a 44 year old house for 20 years with /carpeted floors, with central heat and air; stuffed mattress, non-feather pillow/comforter without humidifier or smokers and dog throughout the house.   Review of Systems  Constitutional: Negative for fever, weight loss and malaise/fatigue.  HENT: Positive for congestion. Negative for ear pain, hearing loss, nosebleeds and sore throat.   Eyes: Negative for discharge and redness.  Respiratory: Negative for shortness of breath.        Denies history of bronchitis and pneumonia.  Gastrointestinal: Negative for heartburn, nausea, vomiting, abdominal pain, diarrhea and constipation.  Genitourinary: Negative.   Musculoskeletal: Negative for myalgias and joint pain.  Skin: Negative.  Negative for itching and rash.  Neurological: Negative.  Negative for dizziness, seizures, weakness and headaches.    Endo/Heme/Allergies: Positive for environmental allergies.       Denies sensitivity to aspirin, NSAIDs, stinging insects,  latex, jewelry and cosmetics.  Immunological: No chronic or recurring infections. Objective:   Filed Vitals:   09/23/15 0908  BP: 140/90  Pulse: 80  Temp: 98.5 F (36.9 C)  Resp: 15   SpO2 Readings from Last 1 Encounters:  09/23/15 98%   Physical Exam  Constitutional: She is well-developed, well-nourished, and in no distress.  HENT:  Head: Atraumatic.  Right Ear: Tympanic membrane and ear canal normal.  Left Ear: Tympanic membrane and ear canal normal.  Nose: Mucosal edema present. No rhinorrhea. No epistaxis.  Mouth/Throat: Oropharynx is clear and moist and mucous membranes are normal. No oropharyngeal exudate, posterior oropharyngeal edema or posterior oropharyngeal erythema.  Eyes: Conjunctivae are normal.  Neck: Neck supple.  Cardiovascular: Normal rate, S1 normal and S2 normal.   No murmur heard. Pulmonary/Chest: Effort normal. She has no wheezes. She has  no rhonchi. She has no rales.  Abdominal: Soft. Normal appearance and bowel sounds are normal.  Musculoskeletal: She exhibits no edema.  Lymphadenopathy:    She has no cervical adenopathy.  Neurological: She is alert.  Skin: Skin is warm and intact. No rash noted. No cyanosis. Nails show no clubbing.   Diagnostics: Spirometry:  FVC 2.63--92%,  FEV1 2.20--92%.    Skin testing: Strong reactivity to ragweed and multiple grass pollens, dust mite, cat hair, dog epithelia and selected mold species; mild reactivity to tree pollens , and cockroach; otherwise negative to selected foods (tree nut/peanut).     Roselyn M. Ishmael Holter, MD   cc: Clearance Coots, MD

## 2015-09-23 NOTE — Patient Instructions (Signed)
Take Home Sheet  1. Avoidance: Mite, Mold and Pollen   2. Antihistamine: Xyzal 5mg  by mouth once daily for runny nose or itching.   3. Nasal Spray: Nasacort 2 spray(s) each nostril once daily for stuffy nose or drainage.     5. Singulair 10mg  each evening.   6. Consider allergy injections--written information today.   Consider selected labs at [facility].  7. Nasal Saline wash each evening at shower time   8. Follow up Visit: 2 months or sooner if needed.   Websites that have reliable Patient information: 1. American Academy of Asthma, Allergy, & Immunology: www.aaaai.org 2. Food Allergy Network: www.foodallergy.org 3. Mothers of Asthmatics: www.aanma.org 4. [place]: DiningCalendar.de 5. American College of Allergy, Asthma, & Immunology: https://robertson.info/ or www.acaai.org

## 2015-09-24 ENCOUNTER — Ambulatory Visit: Payer: BC Managed Care – PPO | Admitting: Allergy and Immunology

## 2015-09-24 ENCOUNTER — Other Ambulatory Visit: Payer: Self-pay

## 2015-09-24 MED ORDER — EPINEPHRINE 0.3 MG/0.3ML IJ SOAJ: INTRAMUSCULAR | Status: DC

## 2015-09-24 NOTE — Telephone Encounter (Signed)
Left message for patient epipen has been sent in to [facility]

## 2015-09-24 NOTE — Telephone Encounter (Signed)
Pt Seen on yesterday by Dr. Ishmael Holter She went to pick up her meds and the pharmacy didn't have the Epi Pen.  Please Advise   [facility] [address]

## 2015-09-27 ENCOUNTER — Encounter: Payer: Self-pay | Admitting: Allergy and Immunology

## 2015-11-25 ENCOUNTER — Ambulatory Visit: Payer: BC Managed Care – PPO | Admitting: Allergy and Immunology

## 2015-11-25 ENCOUNTER — Other Ambulatory Visit: Payer: Self-pay | Admitting: Obstetrics & Gynecology

## 2015-12-14 ENCOUNTER — Encounter (HOSPITAL_COMMUNITY): Payer: Self-pay | Admitting: *Deleted

## 2015-12-14 ENCOUNTER — Inpatient Hospital Stay (HOSPITAL_COMMUNITY)
Admission: EM | Admit: 2015-12-14 | Discharge: 2015-12-17 | DRG: 330 | Disposition: A | Discharge status: Home / Self Care | Payer: BC Managed Care – PPO | Attending: General Surgery | Admitting: General Surgery

## 2015-12-14 ENCOUNTER — Observation Stay (HOSPITAL_COMMUNITY): Payer: BC Managed Care – PPO

## 2015-12-14 ENCOUNTER — Emergency Department (HOSPITAL_COMMUNITY): Payer: BC Managed Care – PPO

## 2015-12-14 ENCOUNTER — Other Ambulatory Visit: Payer: Self-pay

## 2015-12-14 ENCOUNTER — Observation Stay (HOSPITAL_COMMUNITY): Payer: BC Managed Care – PPO | Admitting: Anesthesiology

## 2015-12-14 ENCOUNTER — Encounter (HOSPITAL_COMMUNITY): Admission: EM | Disposition: A | Discharge status: Home / Self Care | Payer: Self-pay | Source: Home / Self Care

## 2015-12-14 DIAGNOSIS — E876 Hypokalemia: Secondary | ICD-10-CM | POA: Diagnosis present

## 2015-12-14 DIAGNOSIS — Z419 Encounter for procedure for purposes other than remedying health state, unspecified: Secondary | ICD-10-CM

## 2015-12-14 DIAGNOSIS — Z5331 Laparoscopic surgical procedure converted to open procedure: Secondary | ICD-10-CM

## 2015-12-14 DIAGNOSIS — D649 Anemia, unspecified: Secondary | ICD-10-CM | POA: Diagnosis present

## 2015-12-14 DIAGNOSIS — K3580 Unspecified acute appendicitis: Secondary | ICD-10-CM | POA: Diagnosis not present

## 2015-12-14 DIAGNOSIS — Z91040 Latex allergy status: Secondary | ICD-10-CM

## 2015-12-14 DIAGNOSIS — Z91018 Allergy to other foods: Secondary | ICD-10-CM

## 2015-12-14 DIAGNOSIS — Z91010 Allergy to peanuts: Secondary | ICD-10-CM

## 2015-12-14 DIAGNOSIS — G8918 Other acute postprocedural pain: Secondary | ICD-10-CM

## 2015-12-14 DIAGNOSIS — Z882 Allergy status to sulfonamides status: Secondary | ICD-10-CM

## 2015-12-14 DIAGNOSIS — Z6841 Body Mass Index (BMI) 40.0 and over, adult: Secondary | ICD-10-CM

## 2015-12-14 DIAGNOSIS — Z833 Family history of diabetes mellitus: Secondary | ICD-10-CM

## 2015-12-14 DIAGNOSIS — Z9049 Acquired absence of other specified parts of digestive tract: Secondary | ICD-10-CM

## 2015-12-14 DIAGNOSIS — Z8249 Family history of ischemic heart disease and other diseases of the circulatory system: Secondary | ICD-10-CM

## 2015-12-14 DIAGNOSIS — R1032 Left lower quadrant pain: Secondary | ICD-10-CM | POA: Diagnosis not present

## 2015-12-14 HISTORY — PX: LAPAROSCOPIC APPENDECTOMY: SHX408

## 2015-12-14 LAB — CBC
HCT: 30.8 % — ABNORMAL LOW (ref 36.0–46.0)
Hemoglobin: 9.8 g/dL — ABNORMAL LOW (ref 12.0–15.0)
MCH: 20.9 pg — AB (ref 26.0–34.0)
MCHC: 31.8 g/dL (ref 30.0–36.0)
MCV: 65.8 fL — ABNORMAL LOW (ref 78.0–100.0)
PLATELETS: 382 10*3/uL (ref 150–400)
RBC: 4.68 MIL/uL (ref 3.87–5.11)
RDW: 14.4 % (ref 11.5–15.5)
WBC: 9.4 10*3/uL (ref 4.0–10.5)

## 2015-12-14 LAB — COMPREHENSIVE METABOLIC PANEL
ALK PHOS: 75 U/L (ref 38–126)
ALT: 23 U/L (ref 14–54)
AST: 23 U/L (ref 15–41)
Albumin: 3.7 g/dL (ref 3.5–5.0)
Anion gap: 10 (ref 5–15)
BUN: 10 mg/dL (ref 6–20)
CHLORIDE: 102 mmol/L (ref 101–111)
CO2: 25 mmol/L (ref 22–32)
Calcium: 9.3 mg/dL (ref 8.9–10.3)
Creatinine, Ser: 0.88 mg/dL (ref 0.44–1.00)
Glucose, Bld: 149 mg/dL — ABNORMAL HIGH (ref 65–99)
POTASSIUM: 3.3 mmol/L — AB (ref 3.5–5.1)
SODIUM: 137 mmol/L (ref 135–145)
Total Bilirubin: 0.5 mg/dL (ref 0.3–1.2)
Total Protein: 7.4 g/dL (ref 6.5–8.1)

## 2015-12-14 LAB — URINALYSIS, ROUTINE W REFLEX MICROSCOPIC
BILIRUBIN URINE: NEGATIVE
Glucose, UA: NEGATIVE mg/dL
KETONES UR: 15 mg/dL — AB
Nitrite: NEGATIVE
PROTEIN: 30 mg/dL — AB
Specific Gravity, Urine: 1.026 (ref 1.005–1.030)
pH: 5.5 (ref 5.0–8.0)

## 2015-12-14 LAB — URINE MICROSCOPIC-ADD ON

## 2015-12-14 LAB — POC URINE PREG, ED: PREG TEST UR: NEGATIVE

## 2015-12-14 LAB — LIPASE, BLOOD: LIPASE: 15 U/L (ref 11–51)

## 2015-12-14 SURGERY — APPENDECTOMY, LAPAROSCOPIC
Anesthesia: General | Site: Abdomen

## 2015-12-14 MED ORDER — GLYCOPYRROLATE 0.2 MG/ML IJ SOLN
INTRAMUSCULAR | Status: DC | PRN
  Administered 2015-12-14: 0.6 mg via INTRAVENOUS

## 2015-12-14 MED ORDER — FENTANYL CITRATE (PF) 250 MCG/5ML IJ SOLN
INTRAMUSCULAR | Status: AC
  Filled 2015-12-14: qty 5

## 2015-12-14 MED ORDER — HYDROMORPHONE HCL 1 MG/ML IJ SOLN
1.0000 mg | INTRAMUSCULAR | Status: DC | PRN
  Administered 2015-12-14 – 2015-12-15 (×7): 1 mg via INTRAVENOUS
  Filled 2015-12-14 (×6): qty 1

## 2015-12-14 MED ORDER — HYDROMORPHONE HCL 1 MG/ML IJ SOLN
0.2500 mg | INTRAMUSCULAR | Status: DC | PRN
  Administered 2015-12-14 (×2): 0.5 mg via INTRAVENOUS

## 2015-12-14 MED ORDER — ONDANSETRON HCL 4 MG/2ML IJ SOLN
4.0000 mg | Freq: Four times a day (QID) | INTRAMUSCULAR | Status: DC | PRN
  Administered 2015-12-14 – 2015-12-16 (×6): 4 mg via INTRAVENOUS
  Filled 2015-12-14 (×4): qty 2

## 2015-12-14 MED ORDER — ONDANSETRON HCL 4 MG/2ML IJ SOLN
INTRAMUSCULAR | Status: AC
  Filled 2015-12-14: qty 2

## 2015-12-14 MED ORDER — FENTANYL CITRATE (PF) 100 MCG/2ML IJ SOLN
INTRAMUSCULAR | Status: DC | PRN
  Administered 2015-12-14: 150 ug via INTRAVENOUS
  Administered 2015-12-14: 50 ug via INTRAVENOUS
  Administered 2015-12-14: 25 ug via INTRAVENOUS
  Administered 2015-12-14 (×2): 50 ug via INTRAVENOUS
  Administered 2015-12-14 (×2): 25 ug via INTRAVENOUS
  Administered 2015-12-14: 50 ug via INTRAVENOUS

## 2015-12-14 MED ORDER — ENOXAPARIN SODIUM 40 MG/0.4ML ~~LOC~~ SOLN
40.0000 mg | SUBCUTANEOUS | Status: DC
  Filled 2015-12-14 (×2): qty 0.4

## 2015-12-14 MED ORDER — DEXTROSE 5 % IV SOLN
2.0000 g | Freq: Once | INTRAVENOUS | Status: AC
  Administered 2015-12-14: 2 g via INTRAVENOUS
  Filled 2015-12-14: qty 2

## 2015-12-14 MED ORDER — 0.9 % SODIUM CHLORIDE (POUR BTL) OPTIME
TOPICAL | Status: DC | PRN
  Administered 2015-12-14 (×2): 1000 mL

## 2015-12-14 MED ORDER — DIPHENHYDRAMINE HCL 25 MG PO CAPS: 25.0000 mg | ORAL_CAPSULE | Freq: Four times a day (QID) | ORAL | Status: DC | PRN

## 2015-12-14 MED ORDER — ROCURONIUM BROMIDE 50 MG/5ML IV SOLN
INTRAVENOUS | Status: AC
  Filled 2015-12-14: qty 1

## 2015-12-14 MED ORDER — NEOSTIGMINE METHYLSULFATE 10 MG/10ML IV SOLN
INTRAVENOUS | Status: DC | PRN
  Administered 2015-12-14: 5 mg via INTRAVENOUS

## 2015-12-14 MED ORDER — MIDAZOLAM HCL 5 MG/5ML IJ SOLN
INTRAMUSCULAR | Status: DC | PRN
  Administered 2015-12-14: 2 mg via INTRAVENOUS

## 2015-12-14 MED ORDER — KCL IN DEXTROSE-NACL 20-5-0.9 MEQ/L-%-% IV SOLN
INTRAVENOUS | Status: DC
  Administered 2015-12-14 – 2015-12-16 (×7): via INTRAVENOUS
  Filled 2015-12-14 (×10): qty 1000

## 2015-12-14 MED ORDER — HYDROMORPHONE HCL 1 MG/ML IJ SOLN
INTRAMUSCULAR | Status: AC
  Administered 2015-12-14: 0.5 mg via INTRAVENOUS
  Filled 2015-12-14: qty 1

## 2015-12-14 MED ORDER — SUCCINYLCHOLINE CHLORIDE 200 MG/10ML IV SOSY
PREFILLED_SYRINGE | INTRAVENOUS | Status: AC
  Filled 2015-12-14: qty 20

## 2015-12-14 MED ORDER — SCOPOLAMINE 1 MG/3DAYS TD PT72
1.0000 | MEDICATED_PATCH | TRANSDERMAL | Status: DC
  Administered 2015-12-14: 1.5 mg via TRANSDERMAL
  Filled 2015-12-14: qty 1

## 2015-12-14 MED ORDER — ROCURONIUM BROMIDE 100 MG/10ML IV SOLN
INTRAVENOUS | Status: DC | PRN
  Administered 2015-12-14: 50 mg via INTRAVENOUS

## 2015-12-14 MED ORDER — ACETAMINOPHEN 650 MG RE SUPP: 650.0000 mg | Freq: Four times a day (QID) | RECTAL | Status: DC | PRN

## 2015-12-14 MED ORDER — SODIUM CHLORIDE 0.9 % IR SOLN
Status: DC | PRN
  Administered 2015-12-14: 1000 mL

## 2015-12-14 MED ORDER — MIDAZOLAM HCL 2 MG/2ML IJ SOLN
INTRAMUSCULAR | Status: AC
  Filled 2015-12-14: qty 2

## 2015-12-14 MED ORDER — PROPOFOL 10 MG/ML IV BOLUS
INTRAVENOUS | Status: AC
  Filled 2015-12-14: qty 20

## 2015-12-14 MED ORDER — ONDANSETRON 4 MG PO TBDP: 4.0000 mg | ORAL_TABLET | Freq: Four times a day (QID) | ORAL | Status: DC | PRN

## 2015-12-14 MED ORDER — BUPIVACAINE-EPINEPHRINE (PF) 0.25% -1:200000 IJ SOLN
INTRAMUSCULAR | Status: AC
  Filled 2015-12-14: qty 30

## 2015-12-14 MED ORDER — ONDANSETRON HCL 4 MG/2ML IJ SOLN
INTRAMUSCULAR | Status: AC
  Administered 2015-12-14: 4 mg via INTRAVENOUS
  Filled 2015-12-14: qty 2

## 2015-12-14 MED ORDER — ACETAMINOPHEN 325 MG PO TABS: 650.0000 mg | ORAL_TABLET | Freq: Four times a day (QID) | ORAL | Status: DC | PRN

## 2015-12-14 MED ORDER — METOCLOPRAMIDE HCL 5 MG/ML IJ SOLN
10.0000 mg | Freq: Once | INTRAMUSCULAR | Status: AC | PRN
  Administered 2015-12-14: 10 mg via INTRAVENOUS

## 2015-12-14 MED ORDER — MORPHINE SULFATE (PF) 4 MG/ML IV SOLN
4.0000 mg | Freq: Once | INTRAVENOUS | Status: AC
  Administered 2015-12-14: 4 mg via INTRAVENOUS
  Filled 2015-12-14: qty 1

## 2015-12-14 MED ORDER — LACTATED RINGERS IV SOLN
INTRAVENOUS | Status: DC
  Administered 2015-12-14 (×2): via INTRAVENOUS

## 2015-12-14 MED ORDER — LABETALOL HCL 5 MG/ML IV SOLN
INTRAVENOUS | Status: DC | PRN
  Administered 2015-12-14 (×2): 5 mg via INTRAVENOUS

## 2015-12-14 MED ORDER — HYDROMORPHONE HCL 1 MG/ML IJ SOLN
1.0000 mg | Freq: Once | INTRAMUSCULAR | Status: DC
  Filled 2015-12-14: qty 1

## 2015-12-14 MED ORDER — BUPIVACAINE-EPINEPHRINE 0.25% -1:200000 IJ SOLN
INTRAMUSCULAR | Status: DC | PRN
  Administered 2015-12-14: 1 mL

## 2015-12-14 MED ORDER — OXYCODONE-ACETAMINOPHEN 5-325 MG PO TABS
1.0000 | ORAL_TABLET | ORAL | Status: DC | PRN
  Administered 2015-12-15 – 2015-12-16 (×2): 2 via ORAL
  Filled 2015-12-14 (×3): qty 2

## 2015-12-14 MED ORDER — IOPAMIDOL (ISOVUE-300) INJECTION 61%
INTRAVENOUS | Status: AC
  Administered 2015-12-14: 100 mL
  Filled 2015-12-14: qty 100

## 2015-12-14 MED ORDER — MONTELUKAST SODIUM 10 MG PO TABS
10.0000 mg | ORAL_TABLET | Freq: Every evening | ORAL | Status: DC
  Administered 2015-12-15: 10 mg via ORAL
  Filled 2015-12-14 (×2): qty 1

## 2015-12-14 MED ORDER — METOCLOPRAMIDE HCL 5 MG/ML IJ SOLN
INTRAMUSCULAR | Status: AC
  Administered 2015-12-14: 10 mg via INTRAVENOUS
  Filled 2015-12-14: qty 2

## 2015-12-14 MED ORDER — MEPERIDINE HCL 25 MG/ML IJ SOLN: 6.2500 mg | INTRAMUSCULAR | Status: DC | PRN

## 2015-12-14 MED ORDER — DIPHENHYDRAMINE HCL 50 MG/ML IJ SOLN: 25.0000 mg | Freq: Four times a day (QID) | INTRAMUSCULAR | Status: DC | PRN

## 2015-12-14 MED ORDER — SODIUM CHLORIDE 0.9 % IV BOLUS (SEPSIS)
1000.0000 mL | Freq: Once | INTRAVENOUS | Status: AC
  Administered 2015-12-14: 1000 mL via INTRAVENOUS

## 2015-12-14 MED ORDER — HYDROMORPHONE HCL 1 MG/ML IJ SOLN
1.0000 mg | Freq: Once | INTRAMUSCULAR | Status: AC
  Administered 2015-12-14: 1 mg via INTRAVENOUS
  Filled 2015-12-14: qty 1

## 2015-12-14 MED ORDER — DEXAMETHASONE SODIUM PHOSPHATE 10 MG/ML IJ SOLN
INTRAMUSCULAR | Status: DC | PRN
  Administered 2015-12-14: 10 mg via INTRAVENOUS

## 2015-12-14 MED ORDER — ONDANSETRON HCL 4 MG/2ML IJ SOLN
4.0000 mg | Freq: Once | INTRAMUSCULAR | Status: AC
  Administered 2015-12-14: 4 mg via INTRAVENOUS
  Filled 2015-12-14: qty 2

## 2015-12-14 MED ORDER — LIDOCAINE HCL (CARDIAC) 20 MG/ML IV SOLN
INTRAVENOUS | Status: DC | PRN
  Administered 2015-12-14: 80 mg via INTRAVENOUS

## 2015-12-14 MED ORDER — METRONIDAZOLE IN NACL 5-0.79 MG/ML-% IV SOLN
500.0000 mg | Freq: Once | INTRAVENOUS | Status: AC
  Administered 2015-12-14: 500 mg via INTRAVENOUS
  Filled 2015-12-14: qty 100

## 2015-12-14 MED ORDER — GLYCOPYRROLATE 0.2 MG/ML IV SOSY
PREFILLED_SYRINGE | INTRAVENOUS | Status: AC
  Filled 2015-12-14: qty 9

## 2015-12-14 MED ORDER — PROPOFOL 10 MG/ML IV BOLUS
INTRAVENOUS | Status: DC | PRN
  Administered 2015-12-14: 160 mg via INTRAVENOUS

## 2015-12-14 SURGICAL SUPPLY — 58 items
APPLIER CLIP ROT 10 11.4 M/L (STAPLE)
BLADE SURG ROTATE 9660 (MISCELLANEOUS) ×3 IMPLANT
CANISTER SUCTION 2500CC (MISCELLANEOUS) ×3 IMPLANT
CELLS DAT CNTRL 66122 CELL SVR (MISCELLANEOUS) ×1 IMPLANT
CHLORAPREP W/TINT 26ML (MISCELLANEOUS) ×3 IMPLANT
CLIP APPLIE ROT 10 11.4 M/L (STAPLE) IMPLANT
COVER SURGICAL LIGHT HANDLE (MISCELLANEOUS) ×6 IMPLANT
CUTTER FLEX LINEAR 45M (STAPLE) ×3 IMPLANT
DRAPE PROXIMA HALF (DRAPES) ×3 IMPLANT
DRAPE UTILITY XL STRL (DRAPES) ×3 IMPLANT
DRSG OPSITE POSTOP 3X4 (GAUZE/BANDAGES/DRESSINGS) ×3 IMPLANT
DRSG TEGADERM 2-3/8X2-3/4 SM (GAUZE/BANDAGES/DRESSINGS) ×6 IMPLANT
ELECT CAUTERY BLADE 6.4 (BLADE) ×3 IMPLANT
ELECT REM PT RETURN 9FT ADLT (ELECTROSURGICAL) ×3
ELECTRODE REM PT RTRN 9FT ADLT (ELECTROSURGICAL) ×1 IMPLANT
ENDOLOOP SUT PDS II  0 18 (SUTURE)
ENDOLOOP SUT PDS II 0 18 (SUTURE) IMPLANT
GAUZE SPONGE 2X2 8PLY STRL LF (GAUZE/BANDAGES/DRESSINGS) ×1 IMPLANT
GLOVE SURG SS PI 6.5 STRL IVOR (GLOVE) ×3 IMPLANT
GLOVE SURG SS PI 7.0 STRL IVOR (GLOVE) ×3 IMPLANT
GLOVE SURG SS PI 7.5 STRL IVOR (GLOVE) ×6 IMPLANT
GOWN STRL REUS W/ TWL LRG LVL3 (GOWN DISPOSABLE) ×6 IMPLANT
GOWN STRL REUS W/TWL LRG LVL3 (GOWN DISPOSABLE) ×12
HANDLE SUCTION POOLE (INSTRUMENTS) ×1 IMPLANT
KIT BASIN OR (CUSTOM PROCEDURE TRAY) ×3 IMPLANT
KIT ROOM TURNOVER OR (KITS) ×3 IMPLANT
LIQUID BAND (GAUZE/BANDAGES/DRESSINGS) ×3 IMPLANT
NS IRRIG 1000ML POUR BTL (IV SOLUTION) ×3 IMPLANT
PAD ARMBOARD 7.5X6 YLW CONV (MISCELLANEOUS) ×6 IMPLANT
PENCIL BUTTON HOLSTER BLD 10FT (ELECTRODE) ×3 IMPLANT
POUCH SPECIMEN RETRIEVAL 10MM (ENDOMECHANICALS) ×3 IMPLANT
RELOAD PROXIMATE 75MM BLUE (ENDOMECHANICALS) ×6 IMPLANT
RELOAD PROXIMATE TA60MM BLUE (ENDOMECHANICALS) ×3 IMPLANT
RELOAD STAPLE TA45 3.5 REG BLU (ENDOMECHANICALS) ×3 IMPLANT
RTRCTR WOUND ALEXIS 18CM MED (MISCELLANEOUS) ×3
SCALPEL HARMONIC ACE (MISCELLANEOUS) ×3 IMPLANT
SET IRRIG TUBING LAPAROSCOPIC (IRRIGATION / IRRIGATOR) ×3 IMPLANT
SPECIMEN JAR SMALL (MISCELLANEOUS) ×3 IMPLANT
SPONGE GAUZE 2X2 STER 10/PKG (GAUZE/BANDAGES/DRESSINGS) ×2
SPONGE LAP 18X18 X RAY DECT (DISPOSABLE) ×6 IMPLANT
STAPLER GUN LINEAR PROX 60 (STAPLE) ×3 IMPLANT
STAPLER PROXIMATE 75MM BLUE (STAPLE) ×3 IMPLANT
STAPLER VISISTAT 35W (STAPLE) ×3 IMPLANT
SUCTION POOLE HANDLE (INSTRUMENTS) ×3
SUT MNCRL AB 4-0 PS2 18 (SUTURE) ×3 IMPLANT
SUT PDS AB 1 TP1 96 (SUTURE) ×12 IMPLANT
SUT SILK 2 0 SH CR/8 (SUTURE) ×6 IMPLANT
SYR BULB IRRIGATION 50ML (SYRINGE) ×3 IMPLANT
TOWEL OR 17X24 6PK STRL BLUE (TOWEL DISPOSABLE) ×3 IMPLANT
TOWEL OR 17X26 10 PK STRL BLUE (TOWEL DISPOSABLE) ×3 IMPLANT
TRAY FOLEY CATH 16FR SILVER (SET/KITS/TRAYS/PACK) ×3 IMPLANT
TRAY LAPAROSCOPIC MC (CUSTOM PROCEDURE TRAY) ×3 IMPLANT
TROCAR XCEL BLUNT TIP 100MML (ENDOMECHANICALS) ×3 IMPLANT
TROCAR XCEL NON-BLD 5MMX100MML (ENDOMECHANICALS) ×6 IMPLANT
TUBE CONNECTING 12'X1/4 (SUCTIONS) ×2
TUBE CONNECTING 12X1/4 (SUCTIONS) ×4 IMPLANT
TUBING INSUFFLATION (TUBING) ×3 IMPLANT
YANKAUER SUCT BULB TIP NO VENT (SUCTIONS) ×3 IMPLANT

## 2015-12-14 NOTE — ED Notes (Signed)
Pt presents via POV c/o LLQ abdominal pain beginning Friday radiating to left flank, also c/o dizziness and lightheadedness.  Reports N/V this am, unknown fever.  A x 4, NAD.

## 2015-12-14 NOTE — ED Provider Notes (Signed)
CSN: [id number]     Arrival date & time 12/14/15  0805 History   First MD Initiated Contact with Patient 12/14/15 [phone]     Chief Complaint  Patient presents with  . Abdominal Pain  . Dizziness    HPI   Jodi Saunders is an 45 y.o. female with history of cholecystectomy who presents to the ED for evaluation of lower left abdominal pain. She states the pain started four days ago and has progressively gotten worse. She states it hurts all over her abdomen but it is the worst in her lower left side. She reports that she did have some nausea but no emesis earlier this morning that she felt was secondary to the degree of her pain. She states it is impossible to find a comfortable position. The pain is worse with movement. She tried 400mg  ibuprofen yesterday with temporary mild relief of her pain. She denies diarrhea. She states she did think she noticed a little bit of discomfort recently while urinating but denies increased urinary frequency/urgency. Denies vaginal complaints. She is s/p cholecystectomy.   Past Medical History  Diagnosis Date  . Allergy   . Multiple allergies   . Anemia   . Hip pain 08/28/2011  . CHOLELITHIASIS, SYMPTOMATIC 12/28/2009    Qualifier: Diagnosis of  By: [name]  MD, [name]    . BACK PAIN 08/05/2009    Qualifier: Diagnosis of  By: [name]  MD, Hosie Poisson    . Tibialis tendinitis 11/19/2007    Qualifier: Diagnosis of  By: [place] Desanctis MD, JOHN    . Earache symptoms in left ear 06/09/2013    Completed amox. Completed Augmentin. Still with pain.  Patient evaluated at [name] on 06/24/2013. No s/s to suggest ear infection. 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  F/u prn.     . Abnormal uterine bleeding 04/21/2013   Past Surgical History  Procedure Laterality Date  . Cholecystectomy  2011  . Tonsillectomy  2008  . Multiple tooth extractions    . Wisdom tooth extraction    . Orif radius & ulna fractures  2002  . Vaginal delivery  1992  . Hysteroscopy  with novasure  06/03/2012    Procedure: HYSTEROSCOPY WITH NOVASURE;  Surgeon: [name], MD;  Location: [facility];  Service: Gynecology;  Laterality: N/A;   Family History  Problem Relation Age of Onset  . Diabetes Father   . Hypertension Father   . Diabetes Paternal Grandmother   . Hypertension Paternal Grandmother   . Diabetes Paternal Grandfather   . Hypertension Paternal Grandfather   . Allergic rhinitis Neg Hx   . Angioedema Neg Hx   . Asthma Neg Hx   . Atopy Neg Hx   . Eczema Neg Hx   . Immunodeficiency Neg Hx   . Urticaria Neg Hx    Social History  Substance Use Topics  . Smoking status: Never Smoker   . Smokeless tobacco: Never Used  . Alcohol Use: Yes     Comment: occasionally   OB History    Gravida Para Term Preterm AB TAB SAB Ectopic Multiple Living   3 1 1  2 2    1      Review of Systems  All other systems reviewed and are negative.     Allergies  Peanut-containing drug products; Other; Sulfonamide derivatives; and Latex  Home Medications   Prior to Admission medications   Medication Sig Start Date End Date Taking? Authorizing Provider  cetirizine (ZYRTEC) 10 MG tablet Take 10 mg by mouth daily as needed.     Historical Provider, MD  docusate sodium (COLACE) 100 MG capsule Take 100 mg by mouth daily as needed.     Historical Provider, MD  EPINEPHrine (EPIPEN 2-PAK) 0.3 mg/0.3 mL IJ SOAJ injection USE AS DIRECTED FOR SEVERE ALLERGIC REACTION 09/24/15   Roselyn Malachy Moan, MD  ferrous sulfate ([facility] IRON) 325 (65 FE) MG tablet Take 1 tablet (325 mg total) by mouth 3 (three) times daily. Patient not taking: Reported on 09/23/2015 06/01/15   Rosemarie Ax, MD  ibuprofen (ADVIL,MOTRIN) 800 MG tablet Take 1 tablet (800 mg total) by mouth 3 (three) times daily. 12/07/13   Fransico Meadow, PA-C  levocetirizine (XYZAL) 5 MG tablet One tablet by mouth once daily for runny nose or itching. 09/23/15   Roselyn Malachy Moan, MD  montelukast (SINGULAIR) 10 MG tablet Take 1 tablet  (10 mg total) by mouth every evening. 09/23/15   Roselyn Malachy Moan, MD  SPRINTEC 28 0.25-35 MG-MCG tablet TAKE 1 TABLET BY MOUTH DAILY. 11/26/15   Woodroe Mode, MD   BP 127/78 mmHg  Pulse 88  Temp(Src) 97.9 F (36.6 C) (Oral)  Resp 17  Ht 5\' 4"  (1.626 m)  Wt 121.564 kg  BMI 45.98 kg/m2  SpO2 99%  LMP 11/02/2015 Physical Exam  Constitutional: She is oriented to person, place, and time.  Appears uncomfortable, rolling around in bed  HENT:  Right Ear: External ear normal.  Left Ear: External ear normal.  Nose: Nose normal.  Mouth/Throat: Oropharynx is clear and moist. No oropharyngeal exudate.  Eyes: Conjunctivae and EOM are normal. Pupils are equal, round, and reactive to light.  Neck: Normal range of motion. Neck supple.  Cardiovascular: Normal rate, regular rhythm, normal heart sounds and intact distal pulses.   Pulmonary/Chest: Effort normal and breath sounds normal. No respiratory distress. She has no wheezes.  Abdominal: Soft. Bowel sounds are normal. She exhibits no distension. There is tenderness. There is guarding. There is no rebound.  Diffusely tender but most tender in LLQ with guarding. No rebound. Abdomen soft. No CVA tenderness  Musculoskeletal: She exhibits no edema.  Neurological: She is alert and oriented to person, place, and time. No cranial nerve deficit.  Skin: Skin is warm and dry. There is pallor.  Psychiatric: She has a normal mood and affect.  Nursing note and vitals reviewed.   ED Course  Procedures (including critical care time) Labs Review Labs Reviewed  COMPREHENSIVE METABOLIC PANEL - Abnormal; Notable for the following:    Potassium 3.3 (*)    Glucose, Bld 149 (*)    All other components within normal limits  CBC - Abnormal; Notable for the following:    Hemoglobin 9.8 (*)    HCT 30.8 (*)    MCV 65.8 (*)    MCH 20.9 (*)    All other components within normal limits  URINALYSIS, ROUTINE W REFLEX MICROSCOPIC (NOT AT [facility]) - Abnormal; Notable for  the following:    Color, Urine AMBER (*)    APPearance TURBID (*)    Hgb urine dipstick LARGE (*)    Ketones, ur 15 (*)    Protein, ur 30 (*)    Leukocytes, UA SMALL (*)    All other components within normal limits  URINE MICROSCOPIC-ADD ON - Abnormal; Notable for the following:    Squamous Epithelial / LPF 6-30 (*)    Bacteria, UA MANY (*)    Casts HYALINE  CASTS (*)    All other components within normal limits  URINE CULTURE  LIPASE, BLOOD  POC URINE PREG, ED    Imaging Review Ct Abdomen Pelvis W Contrast  12/14/2015  CLINICAL DATA:  Left lower quadrant abdominal pain radiating to left flank beginning Friday, getting worse the last few days. EXAM: CT ABDOMEN AND PELVIS WITH CONTRAST TECHNIQUE: Multidetector CT imaging of the abdomen and pelvis was performed using the standard protocol following bolus administration of intravenous contrast. CONTRAST:  198mL ISOVUE-300 IOPAMIDOL (ISOVUE-300) INJECTION 61% COMPARISON:  None. FINDINGS: Lower chest: Lung bases are clear. No effusions. Heart is normal size. Hepatobiliary: Prior cholecystectomy. No focal hepatic abnormality or biliary ductal dilatation. Pancreas: No focal abnormality or ductal dilatation. Spleen: No focal abnormality.  Normal size. Adrenals/Urinary Tract: No adrenal abnormality. No focal renal abnormality. No stones or hydronephrosis. Urinary bladder is unremarkable. Stomach/Bowel: The appendix is dilated. Surrounding fluid and inflammation compatible with appendicitis. The cecal wall is thickened as well. Stomach and small bowel are decompressed. Vascular/Lymphatic: No evidence of aneurysm or adenopathy. Reproductive: Uterus and adnexa unremarkable.  No mass. Other: Small amount of free fluid in the pelvis and in the right lower quadrant adjacent to the appendix. No free air. Musculoskeletal: No acute bony abnormality. IMPRESSION: Dilated, inflamed appendix compatible with acute appendicitis. Adjacent cecal wall is also thickened.  Small amount of free fluid in the pelvis and right lower quadrant. Electronically Signed   By: Rolm Baptise M.D.   On: 12/14/2015 10:51   I have personally reviewed and evaluated these images and lab results as part of my medical decision-making.   EKG Interpretation   Date/Time:  Tuesday December 14 2015 08:21:52 EDT Ventricular Rate:  87 PR Interval:    QRS Duration: 96 QT Interval:  391 QTC Calculation: 471 R Axis:   -3 Text Interpretation:  Sinus rhythm Abnormal R-wave progression, early  transition No significant change since last tracing Confirmed by  Winfred Leeds  MD, Danijah Noh [phone]) on 12/14/2015 8:36:17 AM Also confirmed by  Winfred Leeds  MD, [place] [phone]), editor [place] CT, Leda Gauze [phone])  on  12/14/2015 8:58:29 AM      MDM   Final diagnoses:  Acute appendicitis, unspecified acute appendicitis type    CT with acute appendicitis. No rupture. Pain improved in the ED. Pt has been NPO since 5:30 PM last night. Will call general surgery.  I spoke with Emina of the surgery service who will come see the pt in the ED. In the meantime will keep NPO and start on IV rocephin and flagyl.   Anne Ng, PA-C 12/14/15 [place], MD 12/14/15 [phone]

## 2015-12-14 NOTE — Op Note (Signed)
12/14/2015  5:47 PM  PATIENT:  Jodi Saunders  45 y.o. female  PRE-OPERATIVE DIAGNOSIS:  Appendicitis  POST-OPERATIVE DIAGNOSIS:  Appendicitis  PROCEDURE:  Procedure(s): APPENDECTOMY LAPAROSCOPIC CONVERTED TO OPEN Ileocecectomy  SURGEON:  Surgeon(s) and Role:    * Jovita Kussmaul, MD - Primary  PHYSICIAN ASSISTANT:   ASSISTANTS: none   ANESTHESIA:   general  EBL:  Total I/O In: -  Out: 75 [Blood:75]  BLOOD ADMINISTERED:none  DRAINS: none   LOCAL MEDICATIONS USED:  MARCAINE     SPECIMEN:  Source of Specimen:  terminal ileum and cecum  DISPOSITION OF SPECIMEN:  PATHOLOGY  COUNTS:  NO . Xray performed with no retained foreign body and ACTION TAKEN: X-RAY(S) TAKEN neg  TOURNIQUET:  * No tourniquets in log *  DICTATION: .Dragon Dictation   After informed consent was obtained patient was brought to the operating room placed in the supine position on the operating room table. After adequate induction of general anesthesia the patient's abdomen was prepped with ChloraPrep, allowed to dry, and draped in usual sterile manner. The area below the umbilicus was infiltrated with quarter percent Marcaine. A small incision was made with a 15 blade knife. This incision was carried down through the subcutaneous tissue bluntly with a hemostat and Army-Navy retractors until the linea alba was identified. The linea alba was incised with a 15 blade knife. Each side was grasped Coker clamps and elevated anteriorly. The preperitoneal space was probed bluntly with a hemostat until the peritoneum was opened and access was gained to the abdominal cavity. A 0 Vicryl purse string stitch was placed in the fascia surrounding the opening. A Hassan cannula was placed through the opening and anchored in place with the previously placed Vicryl purse string stitch. The laparoscope was placed through the [facility] cannula. The abdomen was insufflated with carbon dioxide without difficulty. Next the suprapubic area  was infiltrated with quarter percent Marcaine. A small incision was made with a 15 blade knife. A 5 mm port was placed bluntly through this incision into the abdominal cavity. A site was then chosen between the 2 port for placement of a 5 mm port. The area was infiltrated with quarter percent Marcaine. A small stab incision was made with a 15 blade knife. A 5 mm port was placed bluntly through this incision and the abdominal cavity under direct vision. The laparoscope was then moved to the suprapubic port. Using a Glassman grasper and harmonic scalpel the right lower quadrant was inspected. The appendix was readily identified. After a significant amount of blunt dissection I was finally able to elevate the cecum and appendix.The appendix was severely inflamed and thick walled  And this continued onto the proximal cecum. There was no area that would be safe to try to place a stapling device near the appendix.At this point I decided to do an ileocecectomy. The right colon was mobilized by incising the retroperitoneal attachments along the white line of Toldt and bluntly mobilizing the right colon medially. I then made an infraumbilical midline incision with a 10 blade knife. The incision was carried through the skin and subcutaneous tissue sharply with the electrocautery until the linea alba was identified. The linea alba was incised with the electrocautery and the incision was opened under direct vision. The terminal ileum and right colon were able to be mobilized up into the wound. A site was chosen on the distal ileum for division of the small bowel. The mesentery at this point was opened sharply with  the electrocautery. A GIA-75 stapler was then placed across the small bowel at this point, clamped, and fired thereby dividing the small bowel between staple lines.A similar site was chosen on the proximal cecum.The mesentery at this point was opened sharply with electrocautery. A GIA-75 stapler was placed across the  colon at this point, clamped, and firedthereby dividing the cecum between staple lines. The mesentery to this portion of terminal ileum and cecum was then taken down sharply with the Harmonic scalpel.  The  Artery to the terminal ileum was tied off with a 2-0 silk suture ligature.The distal ileum andproximal right colon approximated each other readily. A small opening was made on the antimesentericwall of the small bowel and colon with the electrocautery. Each limb of a GIA-75 stapler was then placed down the appropriate limb of right colon and small bowel, clamped, and fired there creating a nice widely patent entero-enterostomy. The common opening was closed with a TA 60 stapler. The mesenteric defect was closed with multiple figure-of-eight 2-0 silk stitches. The staple line was imbricated with interrupted 2-0 silk Lembert stitches. A 2-0 silk crotch stitch was also placed.Once this was accomplished the anastomosis seemed widely patent and healthy with no tension. The bowel was placed back into the abdominal cavity.The abdomen was irrigated with copious amounts of saline. The fascia of the abdominal wall was closed with 2 running #1 double-stranded loop PDS sutures. The subcutaneous tissue was irrigated with copious amounts of saline. The skin was closed with staples. The ports were removed. The patient tolerated the procedure well. At the end of the case there was an incorrect instrument count. An abdominal x-ray was performed that confirmed that there was no retained foreign body.The patient was then awakened and taken to recovery in stable condition.  PLAN OF CARE: Admit to inpatient   PATIENT DISPOSITION:  PACU - hemodynamically stable.   Delay start of Pharmacological VTE agent (>24hrs) due to surgical blood loss or risk of bleeding: no

## 2015-12-14 NOTE — Interval H&P Note (Signed)
History and Physical Interval Note:  12/14/2015 1:17 PM  Jodi Saunders  has presented today for surgery, with the diagnosis of Appendicitis  The various methods of treatment have been discussed with the patient and family. After consideration of risks, benefits and other options for treatment, the patient has consented to  Procedure(s): APPENDECTOMY LAPAROSCOPIC (N/A) as a surgical intervention .  The patient's history has been reviewed, patient examined, no change in status, stable for surgery.  I have reviewed the patient's chart and labs.  Questions were answered to the patient's satisfaction.     TOTH III,PAUL S

## 2015-12-14 NOTE — Transfer of Care (Signed)
Immediate Anesthesia Transfer of Care Note  Patient: Jodi Saunders  Procedure(s) Performed: Procedure(s): APPENDECTOMY LAPAROSCOPIC CONVERTED TO OPEN (N/A)  Patient Location: PACU  Anesthesia Type:General  Level of Consciousness: awake, alert , oriented and patient cooperative  Airway & Oxygen Therapy: Patient Spontanous Breathing and Patient connected to nasal cannula oxygen  Post-op Assessment: Report given to RN and Post -op Vital signs reviewed and stable  Post vital signs: Reviewed and stable  Last Vitals:  Filed Vitals:   12/14/15 1836 12/14/15 1837  BP: 151/90   Pulse:    Temp:  36.3 C  Resp: 19     Last Pain:  Filed Vitals:   12/14/15 1840  PainSc: 3       Patients Stated Pain Goal: 3 ([phone] [phone])  Complications: No apparent anesthesia complications

## 2015-12-14 NOTE — Anesthesia Postprocedure Evaluation (Signed)
Anesthesia Post Note  Patient: Jodi Saunders  Procedure(s) Performed: Procedure(s) (LRB): APPENDECTOMY LAPAROSCOPIC CONVERTED TO OPEN (N/A)  Patient location during evaluation: PACU Anesthesia Type: General Level of consciousness: awake and alert Pain management: pain level controlled Vital Signs Assessment: post-procedure vital signs reviewed and stable Respiratory status: spontaneous breathing, nonlabored ventilation, respiratory function stable and patient connected to nasal cannula oxygen Cardiovascular status: blood pressure returned to baseline and stable Postop Assessment: no signs of nausea or vomiting Anesthetic complications: no    Last Vitals:  Filed Vitals:   12/14/15 1951 12/14/15 2009  BP: 148/93 155/88  Pulse: 88 95  Temp:  36.7 C  Resp: 21 20    Last Pain:  Filed Vitals:   12/14/15 2010  PainSc: [place] DAVID

## 2015-12-14 NOTE — Care Management Note (Signed)
Case Management Note  Patient Details  Name: Jodi Saunders MRN: [id number] Date of Birth: Apr 24, 1971  Subjective/Objective:                  45 y.o. female with history of cholecystectomy who presents to the ED for evaluation of lower left abdominal pain./ From home.  Action/Plan: Follow for disposition needs.   Expected Discharge Date:  12/16/15               Expected Discharge Plan:  Home/Self Care  In-House Referral:     Discharge planning Services  CM Consult  Post Acute Care Choice:    Choice offered to:     DME Arranged:    DME Agency:     HH Arranged:    HH Agency:     Status of Service:  In process, will continue to follow  If discussed at Long Length of Stay Meetings, dates discussed:    Additional Comments:  Fuller Mandril, RN 12/14/2015, 11:49 AM

## 2015-12-14 NOTE — Anesthesia Preprocedure Evaluation (Signed)
Anesthesia Evaluation  Patient identified by MRN, date of birth, ID band Patient awake    Reviewed: Allergy & Precautions, NPO status , Patient's Chart, lab work & pertinent test results  Airway Mallampati: III  TM Distance: >3 FB Neck ROM: Full    Dental no notable dental hx. (+) Teeth Intact   Pulmonary neg pulmonary ROS, asthma ,    Pulmonary exam normal breath sounds clear to auscultation       Cardiovascular hypertension, Normal cardiovascular exam Rhythm:Regular Rate:Normal     Neuro/Psych Tibialis tendonitis Pes Cavus  Neuromuscular disease negative psych ROS   GI/Hepatic Neg liver ROS, Acute appendicitis   Endo/Other  Morbid obesity  Renal/GU negative Renal ROS  negative genitourinary   Musculoskeletal negative musculoskeletal ROS (+)   Abdominal (+) + obese,   Peds  Hematology  (+) anemia ,   Anesthesia Other Findings   Reproductive/Obstetrics negative OB ROS                             Anesthesia Physical Anesthesia Plan  ASA: III  Anesthesia Plan: General   Post-op Pain Management:    Induction: Intravenous, Rapid sequence and Cricoid pressure planned  Airway Management Planned: Oral ETT  Additional Equipment:   Intra-op Plan:   Post-operative Plan: Extubation in OR  Informed Consent: I have reviewed the patients History and Physical, chart, labs and discussed the procedure including the risks, benefits and alternatives for the proposed anesthesia with the patient or authorized representative who has indicated his/her understanding and acceptance.   Dental advisory given  Plan Discussed with: CRNA, Anesthesiologist and Surgeon  Anesthesia Plan Comments:         Anesthesia Quick Evaluation

## 2015-12-14 NOTE — H&P (Signed)
Chief Complaint: abdominal pain HPI: Jodi Saunders is a 45 year old female with a history of laparoscopic cholecytectomy 2011, anemia, obesity who presents with a 4 day history of abdominal pain. Initially started in the suprapubic region and then radiated to the LLQ.  On Saturday night, the pain worsened and moved to the RLQ as well.  Associated with sweats, malaise, nausea, vomiting and anorexia.  Had a loose bowel movement yesterday. Last oral intake was last night. Modifying factors include; advil.  No alleviating or aggravating factors. Characterized as dull and constant pain which acutely worsened Saturday and Sunday. Work up reveals, normal white count, K 3.3, otherwise normal renal function.  CT of abdomen and pelvis consistent with acute appendicitis.   Past Medical History  Diagnosis Date  . Allergy   . Multiple allergies   . Anemia   . Hip pain 08/28/2011  . CHOLELITHIASIS, SYMPTOMATIC 12/28/2009    Qualifier: Diagnosis of  By: [name]  MD, [name]    . BACK PAIN 08/05/2009    Qualifier: Diagnosis of  By: [name]  MD, Hosie Poisson    . Tibialis tendinitis 11/19/2007    Qualifier: Diagnosis of  By: [place] Desanctis MD, JOHN    . Earache symptoms in left ear 06/09/2013    Completed amox. Completed Augmentin. Still with pain.  Patient evaluated at [name]) on 06/24/2013. No s/s to suggest ear infection. 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  F/u prn.     . Abnormal uterine bleeding 04/21/2013    Past Surgical History  Procedure Laterality Date  . Cholecystectomy  2011  . Tonsillectomy  2008  . Multiple tooth extractions    . Wisdom tooth extraction    . Orif radius & ulna fractures  2002  . Vaginal delivery  1992  . Hysteroscopy with novasure  06/03/2012    Procedure: HYSTEROSCOPY WITH NOVASURE;  Surgeon: [name], MD;  Location: [facility];  Service: Gynecology;  Laterality: N/A;    Family History  Problem Relation Age of Onset  . Diabetes Father   . Hypertension  Father   . Diabetes Paternal Grandmother   . Hypertension Paternal Grandmother   . Diabetes Paternal Grandfather   . Hypertension Paternal Grandfather   . Allergic rhinitis Neg Hx   . Angioedema Neg Hx   . Asthma Neg Hx   . Atopy Neg Hx   . Eczema Neg Hx   . Immunodeficiency Neg Hx   . Urticaria Neg Hx    Social History:  reports that she has never smoked. She has never used smokeless tobacco. She reports that she drinks alcohol. She reports that she does not use illicit drugs.  Allergies:  Allergies  Allergen Reactions  . Peanut-Containing Drug Products     Facial swelling  . Other     Tree nuts  . Sulfonamide Derivatives Nausea And Vomiting    REACTION: nausea, lightheaded  . Latex Rash    Blisters     (Not in a hospital admission)  Results for orders placed or performed during the hospital encounter of 12/14/15 (from the past 48 hour(s))  Lipase, blood     Status: None   Collection Time: 12/14/15  8:45 AM  Result Value Ref Range   Lipase 15 11 - 51 U/L  Comprehensive metabolic panel     Status: Abnormal   Collection Time: 12/14/15  8:45 AM  Result Value Ref Range   Sodium 137 135 - 145 mmol/L  Potassium 3.3 (L) 3.5 - 5.1 mmol/L   Chloride 102 101 - 111 mmol/L   CO2 25 22 - 32 mmol/L   Glucose, Bld 149 (H) 65 - 99 mg/dL   BUN 10 6 - 20 mg/dL   Creatinine, Ser 0.88 0.44 - 1.00 mg/dL   Calcium 9.3 8.9 - 10.3 mg/dL   Total Protein 7.4 6.5 - 8.1 g/dL   Albumin 3.7 3.5 - 5.0 g/dL   AST 23 15 - 41 U/L   ALT 23 14 - 54 U/L   Alkaline Phosphatase 75 38 - 126 U/L   Total Bilirubin 0.5 0.3 - 1.2 mg/dL   GFR calc non Af Amer >60 >60 mL/min   GFR calc Af Amer >60 >60 mL/min    Comment: (NOTE) The eGFR has been calculated using the CKD EPI equation. This calculation has not been validated in all clinical situations. eGFR's persistently <60 mL/min signify possible Chronic Kidney Disease.    Anion gap 10 5 - 15  CBC     Status: Abnormal   Collection Time: 12/14/15   8:45 AM  Result Value Ref Range   WBC 9.4 4.0 - 10.5 K/uL   RBC 4.68 3.87 - 5.11 MIL/uL   Hemoglobin 9.8 (L) 12.0 - 15.0 g/dL   HCT 30.8 (L) 36.0 - 46.0 %   MCV 65.8 (L) 78.0 - 100.0 fL   MCH 20.9 (L) 26.0 - 34.0 pg   MCHC 31.8 30.0 - 36.0 g/dL   RDW 14.4 11.5 - 15.5 %   Platelets 382 150 - 400 K/uL  Urinalysis, Routine w reflex microscopic     Status: Abnormal   Collection Time: 12/14/15  9:03 AM  Result Value Ref Range   Color, Urine AMBER (A) YELLOW    Comment: BIOCHEMICALS MAY BE AFFECTED BY COLOR   APPearance TURBID (A) CLEAR   Specific Gravity, Urine 1.026 1.005 - 1.030   pH 5.5 5.0 - 8.0   Glucose, UA NEGATIVE NEGATIVE mg/dL   Hgb urine dipstick LARGE (A) NEGATIVE   Bilirubin Urine NEGATIVE NEGATIVE   Ketones, ur 15 (A) NEGATIVE mg/dL   Protein, ur 30 (A) NEGATIVE mg/dL   Nitrite NEGATIVE NEGATIVE   Leukocytes, UA SMALL (A) NEGATIVE  Urine microscopic-add on     Status: Abnormal   Collection Time: 12/14/15  9:03 AM  Result Value Ref Range   Squamous Epithelial / LPF 6-30 (A) NONE SEEN   WBC, UA 6-30 0 - 5 WBC/hpf   RBC / HPF 6-30 0 - 5 RBC/hpf   Bacteria, UA MANY (A) NONE SEEN   Casts HYALINE CASTS (A) NEGATIVE    Comment: GRANULAR CAST   Urine-Other MUCOUS PRESENT   POC urine preg, ED     Status: None   Collection Time: 12/14/15  9:08 AM  Result Value Ref Range   Preg Test, Ur NEGATIVE NEGATIVE    Comment:        THE SENSITIVITY OF THIS METHODOLOGY IS >24 mIU/mL    Ct Abdomen Pelvis W Contrast  12/14/2015  CLINICAL DATA:  Left lower quadrant abdominal pain radiating to left flank beginning Friday, getting worse the last few days. EXAM: CT ABDOMEN AND PELVIS WITH CONTRAST TECHNIQUE: Multidetector CT imaging of the abdomen and pelvis was performed using the standard protocol following bolus administration of intravenous contrast. CONTRAST:  157m ISOVUE-300 IOPAMIDOL (ISOVUE-300) INJECTION 61% COMPARISON:  None. FINDINGS: Lower chest: Lung bases are clear. No  effusions. Heart is normal size. Hepatobiliary: Prior cholecystectomy. No focal  hepatic abnormality or biliary ductal dilatation. Pancreas: No focal abnormality or ductal dilatation. Spleen: No focal abnormality.  Normal size. Adrenals/Urinary Tract: No adrenal abnormality. No focal renal abnormality. No stones or hydronephrosis. Urinary bladder is unremarkable. Stomach/Bowel: The appendix is dilated. Surrounding fluid and inflammation compatible with appendicitis. The cecal wall is thickened as well. Stomach and small bowel are decompressed. Vascular/Lymphatic: No evidence of aneurysm or adenopathy. Reproductive: Uterus and adnexa unremarkable.  No mass. Other: Small amount of free fluid in the pelvis and in the right lower quadrant adjacent to the appendix. No free air. Musculoskeletal: No acute bony abnormality. IMPRESSION: Dilated, inflamed appendix compatible with acute appendicitis. Adjacent cecal wall is also thickened. Small amount of free fluid in the pelvis and right lower quadrant. Electronically Signed   By: Rolm Baptise M.D.   On: 12/14/2015 10:51    Review of Systems  All other systems reviewed and are negative.   Blood pressure 137/90, pulse 92, temperature 97.9 F (36.6 C), temperature source Oral, resp. rate 17, height _0  (1.626 m), weight 121.564 kg (268 lb), last menstrual period 11/02/2015, SpO2 100 %. Physical Exam  Constitutional: She is oriented to person, place, and time. She appears well-developed and well-nourished. No distress.  HENT:  Head: Normocephalic and atraumatic.  Neck: Normal range of motion. Neck supple.  Cardiovascular: Normal rate, regular rhythm, normal heart sounds and intact distal pulses.  Exam reveals no gallop and no friction rub.   No murmur heard. Respiratory: Effort normal and breath sounds normal. No respiratory distress. She has no wheezes. She has no rales. She exhibits no tenderness.  GI: Soft. Bowel sounds are normal. She exhibits no mass.  There is no rebound and no guarding.  Mild diffuse tenderness most appreciated rlq.  Neurological: She is alert and oriented to person, place, and time.  Skin: Skin is warm and dry. No rash noted. She is not diaphoretic. No erythema. No pallor.  Psychiatric: She has a normal mood and affect. Her behavior is normal. Judgment and thought content normal.     Assessment/Plan Acute appendicitis- to OR for laparoscopic appendectomy.  Surgical risks discussed including infection, bleeding, injury to surrounding structures, open surgery, anesthesia risks. The patient verbalizes understanding and wishes to proceed.   ID-rocephin/flagyl FEN-NPO, IVF with KCL, AM labs Dispo-to OR, then floor.   Erby Pian, NP 12/14/2015, 12:24 PM

## 2015-12-15 ENCOUNTER — Encounter (HOSPITAL_COMMUNITY): Payer: Self-pay | Admitting: General Surgery

## 2015-12-15 DIAGNOSIS — Z91010 Allergy to peanuts: Secondary | ICD-10-CM | POA: Diagnosis not present

## 2015-12-15 DIAGNOSIS — Z8249 Family history of ischemic heart disease and other diseases of the circulatory system: Secondary | ICD-10-CM | POA: Diagnosis not present

## 2015-12-15 DIAGNOSIS — D649 Anemia, unspecified: Secondary | ICD-10-CM | POA: Diagnosis present

## 2015-12-15 DIAGNOSIS — Z833 Family history of diabetes mellitus: Secondary | ICD-10-CM | POA: Diagnosis not present

## 2015-12-15 DIAGNOSIS — Z91040 Latex allergy status: Secondary | ICD-10-CM | POA: Diagnosis not present

## 2015-12-15 DIAGNOSIS — Z91018 Allergy to other foods: Secondary | ICD-10-CM | POA: Diagnosis not present

## 2015-12-15 DIAGNOSIS — Z882 Allergy status to sulfonamides status: Secondary | ICD-10-CM | POA: Diagnosis not present

## 2015-12-15 DIAGNOSIS — Z9049 Acquired absence of other specified parts of digestive tract: Secondary | ICD-10-CM | POA: Diagnosis not present

## 2015-12-15 DIAGNOSIS — Z5331 Laparoscopic surgical procedure converted to open procedure: Secondary | ICD-10-CM | POA: Diagnosis not present

## 2015-12-15 DIAGNOSIS — K3580 Unspecified acute appendicitis: Secondary | ICD-10-CM | POA: Diagnosis present

## 2015-12-15 DIAGNOSIS — Z6841 Body Mass Index (BMI) 40.0 and over, adult: Secondary | ICD-10-CM | POA: Diagnosis not present

## 2015-12-15 DIAGNOSIS — E876 Hypokalemia: Secondary | ICD-10-CM | POA: Diagnosis present

## 2015-12-15 DIAGNOSIS — R1032 Left lower quadrant pain: Secondary | ICD-10-CM | POA: Diagnosis present

## 2015-12-15 LAB — BASIC METABOLIC PANEL
ANION GAP: 6 (ref 5–15)
BUN: <5 mg/dL — ABNORMAL LOW (ref 6–20)
CO2: 27 mmol/L (ref 22–32)
Calcium: 8.8 mg/dL — ABNORMAL LOW (ref 8.9–10.3)
Chloride: 105 mmol/L (ref 101–111)
Creatinine, Ser: 0.57 mg/dL (ref 0.44–1.00)
GFR calc Af Amer: >60 mL/min (ref >60)
GFR calc non Af Amer: >60 mL/min (ref >60)
GLUCOSE: 158 mg/dL — AB (ref 65–99)
POTASSIUM: 3.8 mmol/L (ref 3.5–5.1)
Sodium: 138 mmol/L (ref 135–145)

## 2015-12-15 LAB — URINE CULTURE: Culture: 2000 — AB

## 2015-12-15 MED ORDER — MENTHOL 3 MG MT LOZG
1.0000 | LOZENGE | OROMUCOSAL | Status: DC | PRN
  Administered 2015-12-15: 3 mg via ORAL
  Filled 2015-12-15 (×3): qty 9

## 2015-12-15 MED ORDER — HYDROMORPHONE HCL 1 MG/ML IJ SOLN
1.0000 mg | INTRAMUSCULAR | Status: DC | PRN
  Administered 2015-12-15 – 2015-12-16 (×7): 1 mg via INTRAVENOUS
  Filled 2015-12-15 (×7): qty 1

## 2015-12-15 NOTE — Progress Notes (Signed)
[facility] Progress Note  1 Day Post-Op  Subjective: POD#1 ileocecectomy; denies abdominal pain at rest. Some nausea this AM that resolved. No vomiting. No flatus or BM yet. Tolerating ice chips, has not tried clears.   VSS   Objective: Vital signs in last 24 hours: Temp:  [97.3 F (36.3 C)-98.5 F (36.9 C)] 97.4 F (36.3 C) (06/28 0400) Pulse Rate:  [72-100] 73 (06/28 0400) Resp:  [12-22] 16 (06/28 0400) BP: (111-161)/(61-99) 153/95 mmHg (06/28 0400) SpO2:  [91 %-100 %] 100 % (06/28 0400) Weight:  [121.5 kg (267 lb 13.7 oz)] 121.5 kg (267 lb 13.7 oz) (06/27 1330) Last BM Date: 12/13/15  Intake/Output from previous day: 06/27 0701 - 06/28 0700 In: 2793 [P.O.:240; I.V.:2553] Out: 1500 [Urine:1425; Blood:75] Intake/Output this shift:    PE: Gen:  Alert, NAD, pleasant Card:  RRR, no M/G/R heard Pulm:  CTA, no W/R/R Abd: Soft, appropriately tender, ND, hypoactive BS,  incisions C/D/I, honeycomb in place without leakage  Lab Results:   Recent Labs  12/14/15 0845  WBC 9.4  HGB 9.8*  HCT 30.8*  PLT 382   BMET  Recent Labs  12/14/15 0845  NA 137  K 3.3*  CL 102  CO2 25  GLUCOSE 149*  BUN 10  CREATININE 0.88  CALCIUM 9.3   PT/INR No results for input(s): LABPROT, INR in the last 72 hours. CMP     Component Value Date/Time   NA 137 12/14/2015 0845   K 3.3* 12/14/2015 0845   CL 102 12/14/2015 0845   CO2 25 12/14/2015 0845   GLUCOSE 149* 12/14/2015 0845   BUN 10 12/14/2015 0845   CREATININE 0.88 12/14/2015 0845   CREATININE 0.64 09/29/2014 1055   CALCIUM 9.3 12/14/2015 0845   PROT 7.4 12/14/2015 0845   ALBUMIN 3.7 12/14/2015 0845   AST 23 12/14/2015 0845   ALT 23 12/14/2015 0845   ALKPHOS 75 12/14/2015 0845   BILITOT 0.5 12/14/2015 0845   GFRNONAA >60 12/14/2015 0845   GFRAA >60 12/14/2015 0845   Lipase     Component Value Date/Time   LIPASE 15 12/14/2015 0845   Studies/Results: Ct Abdomen Pelvis W Contrast  12/14/2015   CLINICAL DATA:  Left lower quadrant abdominal pain radiating to left flank beginning Friday, getting worse the last few days. EXAM: CT ABDOMEN AND PELVIS WITH CONTRAST TECHNIQUE: Multidetector CT imaging of the abdomen and pelvis was performed using the standard protocol following bolus administration of intravenous contrast. CONTRAST:  177mL ISOVUE-300 IOPAMIDOL (ISOVUE-300) INJECTION 61% COMPARISON:  None. FINDINGS: Lower chest: Lung bases are clear. No effusions. Heart is normal size. Hepatobiliary: Prior cholecystectomy. No focal hepatic abnormality or biliary ductal dilatation. Pancreas: No focal abnormality or ductal dilatation. Spleen: No focal abnormality.  Normal size. Adrenals/Urinary Tract: No adrenal abnormality. No focal renal abnormality. No stones or hydronephrosis. Urinary bladder is unremarkable. Stomach/Bowel: The appendix is dilated. Surrounding fluid and inflammation compatible with appendicitis. The cecal wall is thickened as well. Stomach and small bowel are decompressed. Vascular/Lymphatic: No evidence of aneurysm or adenopathy. Reproductive: Uterus and adnexa unremarkable.  No mass. Other: Small amount of free fluid in the pelvis and in the right lower quadrant adjacent to the appendix. No free air. Musculoskeletal: No acute bony abnormality. IMPRESSION: Dilated, inflamed appendix compatible with acute appendicitis. Adjacent cecal wall is also thickened. Small amount of free fluid in the pelvis and right lower quadrant. Electronically Signed   By: Rolm Baptise M.D.   On: 12/14/2015 10:51   Dg  Abd Portable 1v  12/14/2015  CLINICAL DATA:  Incorrect instrument count during appendectomy EXAM: PORTABLE ABDOMEN - 1 VIEW COMPARISON:  None. FINDINGS: There is normal small bowel gas pattern. No metallic instruments are identified. A vertical skin staple line is noted overlying the left sacrum. A skin staple is overlying the left pelvis. There is another staple just above the left iliac bone.  IMPRESSION: No metallic instruments are identified. A vertical skin staple line is noted overlying the left sacrum. A skin staple is overlying the left pelvis. There is another staple just above the left iliac bone. Electronically Signed   By: Lahoma Crocker M.D.   On: 12/14/2015 18:17    Anti-infectives: Anti-infectives    Start     Dose/Rate Route Frequency Ordered Stop   12/14/15 1130  cefTRIAXone (ROCEPHIN) 2 g in dextrose 5 % 50 mL IVPB     2 g 100 mL/hr over 30 Minutes Intravenous  Once 12/14/15 1126 12/14/15 1245   12/14/15 1130  metroNIDAZOLE (FLAGYL) IVPB 500 mg     500 mg 100 mL/hr over 60 Minutes Intravenous  Once 12/14/15 1126 12/14/15 1307     Assessment/Plan Acute Appendicitis S/p laparoscopic converted to open ileocecectomy (Dr. Marlou Starks 12/14/15)  - POD#1  - d/c foley - pain control   hypokalemia - 3.3; on D5 and NS with 20 mEq/L; follow BMP   FEN: clear liquids, advance diet as tolerated. DVT Proph: Lovenox, SCD's  ID: Rocephin/Flagyl once perioperatively  Dispo: ambulate, transition to PO pain control, and advance diet as tolerated  d/c late today vs tomorrow AM    Jodi Saunders , [facility] 12/15/2015, 9:23 AM Pager: [phone] Mon-Fri 7:00 am-4:30 pm Sat-Sun 7:00 am-11:30 am

## 2015-12-15 NOTE — Progress Notes (Signed)
Patient walked twice as of now- total of 250 feet

## 2015-12-15 NOTE — Progress Notes (Signed)
Patient walked the hallway this afternoon with her daughter. Total of about 500 feet.

## 2015-12-16 MED ORDER — HYDROCODONE-ACETAMINOPHEN 10-325 MG PO TABS
1.0000 | ORAL_TABLET | ORAL | Status: DC | PRN
  Administered 2015-12-16 – 2015-12-17 (×4): 1 via ORAL
  Filled 2015-12-16 (×5): qty 1

## 2015-12-16 MED ORDER — POLYETHYLENE GLYCOL 3350 17 G PO PACK
17.0000 g | PACK | Freq: Two times a day (BID) | ORAL | Status: DC | PRN
  Administered 2015-12-16 – 2015-12-17 (×2): 17 g via ORAL
  Filled 2015-12-16 (×2): qty 1

## 2015-12-16 MED ORDER — DOCUSATE SODIUM 100 MG PO CAPS
100.0000 mg | ORAL_CAPSULE | Freq: Two times a day (BID) | ORAL | Status: DC | PRN
  Administered 2015-12-16 – 2015-12-17 (×2): 100 mg via ORAL
  Filled 2015-12-16 (×2): qty 1

## 2015-12-16 MED ORDER — PROCHLORPERAZINE EDISYLATE 5 MG/ML IJ SOLN
10.0000 mg | Freq: Four times a day (QID) | INTRAMUSCULAR | Status: DC | PRN
Start: 2015-12-16 — End: 2015-12-17
  Administered 2015-12-16: 10 mg via INTRAVENOUS
  Filled 2015-12-16 (×3): qty 2

## 2015-12-16 NOTE — Progress Notes (Signed)
Pt. Stated she passed gas x 3 today.

## 2015-12-16 NOTE — Progress Notes (Signed)
Pt. Had 1 occurrence of emesis around 1400. Pt. Vomited in toilet. Could not measure the amount of emesis. Appearance was green/liquid.

## 2015-12-16 NOTE — Progress Notes (Signed)
Pt. Has walked around hall 4 times today. Tolerated well.

## 2015-12-16 NOTE — Progress Notes (Signed)
Emesis x 2. Pt. Still nauseous. Zofran not available until 1954. MD notified.

## 2015-12-16 NOTE — Progress Notes (Signed)
[facility] Progress Note  2 Days Post-Op  Subjective: Abdominal pain improving. Feels "full" and "backed up". No flatus or BM. Ambulating and urinating well. Tolerating clears. Pt complains that percocet makes her feel slightly dizzy/like she will pass out.   Objective: Vital signs in last 24 hours: Temp:  [98.6 F (37 C)-99.4 F (37.4 C)] 98.6 F (37 C) (06/29 0630) Pulse Rate:  [74-86] 85 (06/29 0630) Resp:  [18-20] 19 (06/29 0630) BP: (142-173)/(79-94) 142/82 mmHg (06/29 0630) SpO2:  [93 %-96 %] 93 % (06/29 0630) Last BM Date: 12/13/15  Intake/Output from previous day: 06/28 0701 - 06/29 0700 In: 3240 [P.O.:240; I.V.:3000] Out: 210 [Urine:210] Intake/Output this shift:   PE: Gen: Alert, NAD, pleasant Card: RRR, no M/G/R heard Pulm: CTA, no W/R/R Abd: Soft, appropriately tender, ND, +BS, incisions C/D/I, honeycomb in place without leakage, can remove tomorrow.  Lab Results:   Recent Labs  12/14/15 0845  WBC 9.4  HGB 9.8*  HCT 30.8*  PLT 382   BMET  Recent Labs  12/14/15 0845 12/15/15 0813  NA 137 138  K 3.3* 3.8  CL 102 105  CO2 25 27  GLUCOSE 149* 158*  BUN 10 <5*  CREATININE 0.88 0.57  CALCIUM 9.3 8.8*   PT/INR No results for input(s): LABPROT, INR in the last 72 hours. CMP     Component Value Date/Time   NA 138 12/15/2015 0813   K 3.8 12/15/2015 0813   CL 105 12/15/2015 0813   CO2 27 12/15/2015 0813   GLUCOSE 158* 12/15/2015 0813   BUN <5* 12/15/2015 0813   CREATININE 0.57 12/15/2015 0813   CREATININE 0.64 09/29/2014 1055   CALCIUM 8.8* 12/15/2015 0813   PROT 7.4 12/14/2015 0845   ALBUMIN 3.7 12/14/2015 0845   AST 23 12/14/2015 0845   ALT 23 12/14/2015 0845   ALKPHOS 75 12/14/2015 0845   BILITOT 0.5 12/14/2015 0845   GFRNONAA >60 12/15/2015 0813   GFRAA >60 12/15/2015 0813   Lipase     Component Value Date/Time   LIPASE 15 12/14/2015 0845   Studies/Results: Ct Abdomen Pelvis W Contrast  12/14/2015  CLINICAL  DATA:  Left lower quadrant abdominal pain radiating to left flank beginning Friday, getting worse the last few days. EXAM: CT ABDOMEN AND PELVIS WITH CONTRAST TECHNIQUE: Multidetector CT imaging of the abdomen and pelvis was performed using the standard protocol following bolus administration of intravenous contrast. CONTRAST:  198mL ISOVUE-300 IOPAMIDOL (ISOVUE-300) INJECTION 61% COMPARISON:  None. FINDINGS: Lower chest: Lung bases are clear. No effusions. Heart is normal size. Hepatobiliary: Prior cholecystectomy. No focal hepatic abnormality or biliary ductal dilatation. Pancreas: No focal abnormality or ductal dilatation. Spleen: No focal abnormality.  Normal size. Adrenals/Urinary Tract: No adrenal abnormality. No focal renal abnormality. No stones or hydronephrosis. Urinary bladder is unremarkable. Stomach/Bowel: The appendix is dilated. Surrounding fluid and inflammation compatible with appendicitis. The cecal wall is thickened as well. Stomach and small bowel are decompressed. Vascular/Lymphatic: No evidence of aneurysm or adenopathy. Reproductive: Uterus and adnexa unremarkable.  No mass. Other: Small amount of free fluid in the pelvis and in the right lower quadrant adjacent to the appendix. No free air. Musculoskeletal: No acute bony abnormality. IMPRESSION: Dilated, inflamed appendix compatible with acute appendicitis. Adjacent cecal wall is also thickened. Small amount of free fluid in the pelvis and right lower quadrant. Electronically Signed   By: Rolm Baptise M.D.   On: 12/14/2015 10:51   Dg Abd Portable 1v  12/14/2015  CLINICAL DATA:  Incorrect instrument count during appendectomy EXAM: PORTABLE ABDOMEN - 1 VIEW COMPARISON:  None. FINDINGS: There is normal small bowel gas pattern. No metallic instruments are identified. A vertical skin staple line is noted overlying the left sacrum. A skin staple is overlying the left pelvis. There is another staple just above the left iliac bone. IMPRESSION: No  metallic instruments are identified. A vertical skin staple line is noted overlying the left sacrum. A skin staple is overlying the left pelvis. There is another staple just above the left iliac bone. Electronically Signed   By: Lahoma Crocker M.D.   On: 12/14/2015 18:17    Anti-infectives: Anti-infectives    Start     Dose/Rate Route Frequency Ordered Stop   12/14/15 1130  cefTRIAXone (ROCEPHIN) 2 g in dextrose 5 % 50 mL IVPB     2 g 100 mL/hr over 30 Minutes Intravenous  Once 12/14/15 1126 12/14/15 1245   12/14/15 1130  metroNIDAZOLE (FLAGYL) IVPB 500 mg     500 mg 100 mL/hr over 60 Minutes Intravenous  Once 12/14/15 1126 12/14/15 1307     Assessment/Plan Acute Appendicitis S/p laparoscopic converted to open ileocecectomy (Dr. Marlou Starks 12/14/15)  - POD#2 - start colace/miralax  - switch percocet to [facility]  hypokalemia - resolved   FEN: clear liquids, decrease IVF  DVT Proph: Lovenox, SCD's  ID: Rocephin/Flagyl once perioperatively  Dispo: clears, ambulate, transition to PO pain control  Will clarify specific instructions for travel with Dr. Marlou Starks - advised patient that if she is able to travel she will not be able to soak in swimming pool/ocean/hot tub.    LOS: 1 day    [facility] 12/16/2015, 8:54 AM Pager: [phone] Mon-Fri 7:00 am-4:30 pm Sat-Sun 7:00 am-11:30 am

## 2015-12-17 ENCOUNTER — Encounter: Payer: Self-pay | Admitting: General Surgery

## 2015-12-17 MED ORDER — HYDROCODONE-ACETAMINOPHEN 10-325 MG PO TABS: 1.0000 | ORAL_TABLET | ORAL | Status: DC | PRN

## 2015-12-17 MED ORDER — IBUPROFEN 200 MG PO TABS: ORAL_TABLET | ORAL | Status: DC

## 2015-12-17 MED ORDER — ACETAMINOPHEN 325 MG PO TABS: 650.0000 mg | ORAL_TABLET | Freq: Four times a day (QID) | ORAL | Status: DC | PRN

## 2015-12-17 MED ORDER — FERROUS SULFATE 325 (65 FE) MG PO TABS: ORAL_TABLET | ORAL | Status: DC

## 2015-12-17 NOTE — Discharge Instructions (Signed)
[facility]      [facility] [phone]  OPEN ABDOMINAL SURGERY: POST OP INSTRUCTIONS  Always review your discharge instruction sheet given to you by the facility where your surgery was performed.  IF YOU HAVE DISABILITY OR FAMILY LEAVE FORMS, YOU MUST BRING THEM TO THE OFFICE FOR PROCESSING.  PLEASE DO NOT GIVE THEM TO YOUR DOCTOR.  1. A prescription for pain medication may be given to you upon discharge.  Take your pain medication as prescribed, if needed.  If narcotic pain medicine is not needed, then you may take acetaminophen (Tylenol) or ibuprofen (Advil) as needed. 2. Take your usually prescribed medications unless otherwise directed. 3. If you need a refill on your pain medication, please contact your pharmacy. They will contact our office to request authorization.  Prescriptions will not be filled after 5pm or on week-ends. 4. You should follow a light diet the first few days after arrival home, such as soup and crackers, pudding, etc.unless your doctor has advised otherwise. A high-fiber, low fat diet can be resumed as tolerated.   Be sure to include lots of fluids daily. Most patients will experience some swelling and bruising on the chest and neck area.  Ice packs will help.  Swelling and bruising can take several days to resolve 5. Most patients will experience some swelling and bruising in the area of the incision. Ice pack will help. Swelling and bruising can take several days to resolve..  6. It is common to experience some constipation if taking pain medication after surgery.  Increasing fluid intake and taking a stool softener will usually help or prevent this problem from occurring.  A mild laxative (Milk of Magnesia or Miralax) should be taken according to package directions if there are no bowel movements after 48 hours. 7.  You may have steri-strips (small skin tapes) in place directly over the incision.  These strips should be left on the skin for 7-10 days.  If your  surgeon used skin glue on the incision, you may shower in 24 hours.  The glue will flake off over the next 2-3 weeks.  Any sutures or staples will be removed at the office during your follow-up visit. You may find that a light gauze bandage over your incision may keep your staples from being rubbed or pulled. You may shower and replace the bandage daily. 8. ACTIVITIES:  You may resume regular (light) daily activities beginning the next day--such as daily self-care, walking, climbing stairs--gradually increasing activities as tolerated.  You may have sexual intercourse when it is comfortable.  Refrain from any heavy lifting or straining until approved by your doctor. a. You may drive when you no longer are taking prescription pain medication, you can comfortably wear a seatbelt, and you can safely maneuver your car and apply brakes b. Return to Work: ___________________________________ 9. You should see your doctor in the office for a follow-up appointment approximately two weeks after your surgery.  Make sure that you call for this appointment within a day or two after you arrive home to insure a convenient appointment time. OTHER INSTRUCTIONS:  _____________________________________________________________ _____________________________________________________________  WHEN TO CALL YOUR DOCTOR: 1. Fever over 101.0 2. Inability to urinate 3. Nausea and/or vomiting 4. Extreme swelling or bruising 5. Continued bleeding from incision. 6. Increased pain, redness, or drainage from the incision. 7. Difficulty swallowing or breathing 8. Muscle cramping or spasms. 9. Numbness or tingling in hands or feet or around lips.  The clinic staff is available to   answer your questions during regular business hours.  Please don't hesitate to call and ask to speak to one of the nurses if you have concerns.  For further questions, please visit [email or web address]   

## 2015-12-17 NOTE — Progress Notes (Signed)
Seva L Uitenham to be D/C'd Home per MD order.  Discussed with the patient and all questions fully answered.  VSS. Incisions and staples clean dry and intact. Education provided about incision care.IV catheter discontinued intact. Site without signs and symptoms of complications. Dressing and pressure applied.  An After Visit Summary was printed and given to the patient. Patient received prescription.  D/c education completed with patient/family including follow up instructions, medication list, d/c activities limitations if indicated, with other d/c instructions as indicated by MD - patient able to verbalize understanding, all questions fully answered.   Patient instructed to return to ED, call 911, or call MD for any changes in condition.   Patient to D/C home via private auto.  L'ESPERANCE, Willistine Ferrall C 12/17/2015 3:31 PM

## 2015-12-17 NOTE — Discharge Summary (Signed)
Physician Discharge Summary  Patient ID: Jodi Saunders MRN: [id number] DOB/AGE: [birth year] 44 y.o.  Admit date: 12/14/2015 Discharge date: 12/17/2015  Admission Diagnoses:  Acute appendicitis  Discharge Diagnoses:  Acute Appendicitis  Active Problems:   Acute appendicitis   PROCEDURES: APPENDECTOMY LAPAROSCOPIC CONVERTED TO OPEN Ileocecectomy, 12/14/15, Dr. Alden Benjamin Course:  Jodi Saunders is a 45 year old female with a history of laparoscopic cholecytectomy 2011, anemia, obesity who presents with a 4 day history of abdominal pain. Initially started in the suprapubic region and then radiated to the LLQ. On Saturday night, the pain worsened and moved to the RLQ as well. Associated with sweats, malaise, nausea, vomiting and anorexia. Had a loose bowel movement yesterday. Last oral intake was last night. Modifying factors include; advil. No alleviating or aggravating factors. Characterized as dull and constant pain which acutely worsened Saturday and Sunday. Work up reveals, normal white count, K 3.3, otherwise normal renal function. CT of abdomen and pelvis consistent with acute appendicitis.  Pt seen in the ED and taken to the OR that afternoon.  Procedure as noted above.  She has done well post op.  Diet has been advanced.  She is walking and passing flatus.  No BM so far. She is going on a cruise this weekend so she will come back the week after for staple removal and then follow up with Dr.toth after that.  CBC Latest Ref Rng 12/14/2015 09/29/2014 08/28/2013  WBC 4.0 - 10.5 K/uL 9.4 9.8 8.1  Hemoglobin 12.0 - 15.0 g/dL 9.8(L) 9.8(L) 10.6(L)  Hematocrit 36.0 - 46.0 % 30.8(L) 31.0(L) 33.0(L)  Platelets 150 - 400 K/uL 382 416(H) 404(H)   CMP Latest Ref Rng 12/15/2015 12/14/2015 09/29/2014  Glucose 65 - 99 mg/dL 158(H) 149(H) 87  BUN 6 - 20 mg/dL <5(L) 10 7  Creatinine 0.44 - 1.00 mg/dL 0.57 0.88 0.64  Sodium 135 - 145 mmol/L 138 137 137  Potassium 3.5 - 5.1 mmol/L 3.8 3.3(L) 4.4   Chloride 101 - 111 mmol/L 105 102 102  CO2 22 - 32 mmol/L 27 25 24   Calcium 8.9 - 10.3 mg/dL 8.8(L) 9.3 9.1  Total Protein 6.5 - 8.1 g/dL - 7.4 -  Total Bilirubin 0.3 - 1.2 mg/dL - 0.5 -  Alkaline Phos 38 - 126 U/L - 75 -  AST 15 - 41 U/L - 23 -  ALT 14 - 54 U/L - 23 -    Condition on d/c:  Improved  Disposition: 01-Home or Self Care     Medication List    TAKE these medications        acetaminophen 325 MG tablet  Commonly known as:  TYLENOL  Take 2 tablets (650 mg total) by mouth every 6 (six) hours as needed for mild pain (or temp > 100).     cetirizine 10 MG tablet  Commonly known as:  ZYRTEC  Take 10 mg by mouth daily as needed.     docusate sodium 100 MG capsule  Commonly known as:  COLACE  Take 100 mg by mouth daily as needed.     EPINEPHrine 0.3 mg/0.3 mL Soaj injection  Commonly known as:  EPIPEN 2-PAK  USE AS DIRECTED FOR SEVERE ALLERGIC REACTION     ferrous sulfate 325 (65 FE) MG tablet  Commonly known as:  [employer] IRON  Resume after your bowel movements are back to normal, and you are back on your regular diet.     HYDROcodone-acetaminophen 10-325 MG tablet  Commonly known as:  NORCO  Take 1 tablet by mouth every 4 (four) hours as needed for moderate pain or severe pain.     ibuprofen 200 MG tablet  Commonly known as:  ADVIL,MOTRIN  You can safely take 2-3 tablets every 6 hours.  I would use this first and then the prescription pill with narcotic as second line treatment.     levocetirizine 5 MG tablet  Commonly known as:  XYZAL  One tablet by mouth once daily for runny nose or itching.     montelukast 10 MG tablet  Commonly known as:  SINGULAIR  Take 1 tablet (10 mg total) by mouth every evening.     Coronita 28 0.25-35 MG-MCG tablet  Generic drug:  norgestimate-ethinyl estradiol  TAKE 1 TABLET BY MOUTH DAILY.           Follow-up Information    Follow up with Merrie Roof, MD On 01/04/2016.   Specialty:  General Surgery   Why:  Your  appointment is at 12:20, be at the office 30 minutes early for check in.   Contact information:   [address] [place] [address] [phone]       Follow up with [facility] On 12/27/2015.   Specialty:  General Surgery   Why:  Your appointment for staple removal is at 10 AM, be at the office 30 minutes early for check in.   Contact information:   [address]  [place] [address] [phone]       Signed: Earnstine Regal 12/17/2015, 3:51 PM

## 2015-12-17 NOTE — Progress Notes (Signed)
3 Days Post-Op  Subjective: She looks great.  i will advance her to a soft diet, i told her to go slow.  Will check on staples. She goes on a cruise Sunday.  We may just have to get them out when she comes back.   Objective: Vital signs in last 24 hours: Temp:  [98.6 F (37 C)-99.3 F (37.4 C)] 98.6 F (37 C) (06/30 0517) Pulse Rate:  [96-100] 97 (06/30 0517) Resp:  [18-22] 18 (06/29 1959) BP: (146-167)/(87-105) 146/87 mmHg (06/30 0517) SpO2:  [96 %-97 %] 97 % (06/30 0517) Last BM Date: 12/13/15 240 PO 1443 Po Urine x 3 Vomited x 1  Afebrile, BP is up,  No labs since 6/28 No films Intake/Output from previous day: 06/29 0701 - 06/30 0700 In: 1683.8 [P.O.:240; I.V.:1443.8] Out: 200 [Emesis/NG output:200] Intake/Output this shift: Total I/O In: 115 [I.V.:115] Out: -   General appearance: alert, cooperative and no distress Resp: clear to auscultation bilaterally GI: soft, sore sites look fine.  + flatus, no BM so far.  Lab Results:  No results for input(s): WBC, HGB, HCT, PLT in the last 72 hours.  BMET  Recent Labs  12/15/15 0813  NA 138  K 3.8  CL 105  CO2 27  GLUCOSE 158*  BUN <5*  CREATININE 0.57  CALCIUM 8.8*   PT/INR No results for input(s): LABPROT, INR in the last 72 hours.   Recent Labs Lab 12/14/15 0845  AST 23  ALT 23  ALKPHOS 75  BILITOT 0.5  PROT 7.4  ALBUMIN 3.7     Lipase     Component Value Date/Time   LIPASE 15 12/14/2015 0845     Studies/Results: No results found.  Medications: . enoxaparin (LOVENOX) injection  40 mg Subcutaneous Q24H  . montelukast  10 mg Oral QPM    Assessment/Plan Acute Appendicitis S/p laparoscopic converted to open ileocecectomy (Dr. Marlou Starks 12/14/15)  - POD#3 switch percocet to [facility] hypokalemia - resolved   FEN: clear liquids, advance to a soft diet, I told her to go slow; stop IVF  DVT Proph: Lovenox, SCD's  ID: Rocephin/Flagyl once perioperatively  Dispo: See about getting her home  today, and staples out next week.  Soft diet this AM, if she does well go home later.      LOS: 2 days    Jodi Saunders 12/17/2015 [phone]

## 2016-02-04 ENCOUNTER — Telehealth: Payer: Self-pay | Admitting: *Deleted

## 2016-02-04 NOTE — Telephone Encounter (Signed)
Jodi Saunders called and left a message yesterday afternoon stating she had an appendectomy and ileocetomy about 7 weeks ago and has seen the surgeon- said healing fine. States she started feeling crampy last week in abdomen and had some old and new bleeding - and not sure if it menstrual because she had a cycle. Called her surgeon and they said call ob/gyn.    I called Jodi Saunders and she reports she had ablation in 2013 , but since then still has regular cycles but not as heavy.  States she has decided she just had her period early this time , still bleeding, having cramps controlled by advil. We discussed stress can affect cycle. I advised her if severe pain or heavy bleeding go to mau. She voices understanding.

## 2016-03-10 ENCOUNTER — Other Ambulatory Visit: Payer: Self-pay

## 2016-03-13 ENCOUNTER — Other Ambulatory Visit: Payer: Self-pay

## 2016-03-16 ENCOUNTER — Other Ambulatory Visit: Payer: Self-pay

## 2016-03-16 MED ORDER — MONTELUKAST SODIUM 10 MG PO TABS: 10.0000 mg | ORAL_TABLET | Freq: Every evening | ORAL | 0 refills | Status: DC

## 2016-03-16 MED ORDER — LEVOCETIRIZINE DIHYDROCHLORIDE 5 MG PO TABS: ORAL_TABLET | ORAL | 0 refills | Status: DC

## 2016-04-13 ENCOUNTER — Other Ambulatory Visit: Payer: Self-pay | Admitting: Obstetrics & Gynecology

## 2016-06-15 ENCOUNTER — Other Ambulatory Visit: Payer: Self-pay | Admitting: Allergy & Immunology

## 2016-08-11 ENCOUNTER — Other Ambulatory Visit: Payer: Self-pay | Admitting: Obstetrics and Gynecology

## 2016-08-11 DIAGNOSIS — Z1231 Encounter for screening mammogram for malignant neoplasm of breast: Secondary | ICD-10-CM

## 2016-08-17 HISTORY — PX: BREAST BIOPSY: SHX20

## 2016-08-28 ENCOUNTER — Other Ambulatory Visit: Payer: Self-pay | Admitting: Obstetrics and Gynecology

## 2016-08-28 ENCOUNTER — Ambulatory Visit
Admission: RE | Admit: 2016-08-28 | Discharge: 2016-08-28 | Disposition: A | Discharge status: Home / Self Care | Payer: BC Managed Care – PPO | Source: Ambulatory Visit | Attending: Obstetrics and Gynecology | Admitting: Obstetrics and Gynecology

## 2016-08-28 DIAGNOSIS — Z1231 Encounter for screening mammogram for malignant neoplasm of breast: Secondary | ICD-10-CM

## 2016-08-28 DIAGNOSIS — N6320 Unspecified lump in the left breast, unspecified quadrant: Secondary | ICD-10-CM

## 2016-08-29 ENCOUNTER — Other Ambulatory Visit: Payer: Self-pay | Admitting: Obstetrics & Gynecology

## 2016-08-29 ENCOUNTER — Other Ambulatory Visit: Payer: Self-pay | Admitting: Obstetrics and Gynecology

## 2016-08-29 DIAGNOSIS — R928 Other abnormal and inconclusive findings on diagnostic imaging of breast: Secondary | ICD-10-CM

## 2016-08-31 ENCOUNTER — Other Ambulatory Visit: Payer: Self-pay | Admitting: Obstetrics & Gynecology

## 2016-08-31 ENCOUNTER — Ambulatory Visit
Admission: RE | Admit: 2016-08-31 | Discharge: 2016-08-31 | Disposition: A | Discharge status: Home / Self Care | Payer: BC Managed Care – PPO | Source: Ambulatory Visit | Attending: Obstetrics & Gynecology | Admitting: Obstetrics & Gynecology

## 2016-08-31 DIAGNOSIS — N6320 Unspecified lump in the left breast, unspecified quadrant: Secondary | ICD-10-CM

## 2016-08-31 DIAGNOSIS — R928 Other abnormal and inconclusive findings on diagnostic imaging of breast: Secondary | ICD-10-CM

## 2016-09-21 NOTE — Progress Notes (Signed)
Subjective:  Jodi Saunders is a 46 y.o. year old female who presents to office today for an annual physical examination.  Concerns today include:  1. Dry and itchy skin on toe: Patient is that on her left foot in between her toes she has had some dry skin that is itchy. She denies any discharge from the area. She denies any fevers or chills. Does wear closed toed shoes every day.   Review of Systems  Constitutional: Negative for fever and weight loss.  HENT: Negative for ear pain, hearing loss and sinus pain.   Eyes: Negative for blurred vision.  Respiratory: Negative for cough, shortness of breath and wheezing.   Cardiovascular: Negative for chest pain and leg swelling.  Gastrointestinal: Negative for abdominal pain, blood in stool, constipation, diarrhea, heartburn, melena, nausea and vomiting.  Genitourinary: Negative for dysuria and frequency.  Musculoskeletal: Negative for back pain and joint pain.  Skin: Negative for rash.  Neurological: Negative for dizziness, tingling, focal weakness and headaches.  Psychiatric/Behavioral: Negative for depression and suicidal ideas.    General Healthcare: Medication Compliance:  Dx Hypertension: Yes (not on any medications) Dx Hyperlipidemia: No Diabetes: No Dx Obesity: Yes Weight Loss: No  Physical Activity: Minimal. Does not exercise Urinary Incontinence: No Menstrual hx: Still getting monthly periods  Social:  reports that she has never smoked. She has never used smokeless tobacco. Driving: Drives car, wears a seatbelt Alcohol Use: None Tobacco None Other Drugs: None Support and Life at Home: Yes, feels supported Advanced Directives: No Work: Desk job  Past Medical History Past Medical History:  Diagnosis Date  . Abnormal uterine bleeding 04/21/2013  . Allergy   . Anemia   . BACK PAIN 08/05/2009   Qualifier: Diagnosis of  By: [name]  MD, Hosie Poisson    . CHOLELITHIASIS, SYMPTOMATIC 12/28/2009   Qualifier: Diagnosis of  By: [name]   MD, [name]    . Earache symptoms in left ear 06/09/2013   Completed amox. Completed Augmentin. Still with pain.  Patient evaluated at [name] on 06/24/2013. No s/s to suggest ear infection. 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  F/u prn.     . Hip pain 08/28/2011  . Multiple allergies   . Tibialis tendinitis 11/19/2007   Qualifier: Diagnosis of  By: [place] Desanctis MD, [facility]     Patient Active Problem List   Diagnosis Date Noted  . Tinea pedis 09/25/2016  . Annual physical exam 09/30/2014  . Obesity 09/30/2014  . Engages in travel abroad 09/30/2014  . ASCUS (atypical squamous cells of undetermined significance) on Pap smear 09/06/2012  . Menorrhagia 10/11/2011  . Hypertension 06/30/2011  . Congenital pes planus 11/19/2007  . OBESITY, NOS 08/16/2006  . Anemia 08/16/2006    Medications- reviewed and updated Current Outpatient Prescriptions  Medication Sig Dispense Refill  . cetirizine (ZYRTEC) 10 MG tablet Take 10 mg by mouth daily as needed.     . clotrimazole (LOTRIMIN) 1 % cream Apply 1 application topically 2 (two) times daily. 30 g 0  . EPINEPHrine (EPIPEN 2-PAK) 0.3 mg/0.3 mL IJ SOAJ injection USE AS DIRECTED FOR SEVERE ALLERGIC REACTION 2 Device 1  . ferrous sulfate ([facility] IRON) 325 (65 FE) MG tablet Resume after your bowel movements are back to normal, and you are back on your regular diet. (Patient not taking: Reported on 09/22/2016) 90 tablet 1  . SPRINTEC 28 0.25-35 MG-MCG tablet TAKE 1 TABLET BY MOUTH DAILY. (Patient not taking: Reported on  09/22/2016) 28 tablet 4   No current facility-administered medications for this visit.     Objective: BP 124/88   Pulse 78   Temp 98.4 F (36.9 C) (Oral)   Ht 5\' 4"  (1.626 m)   Wt 268 lb 3.2 oz (121.7 kg)   LMP 09/17/2016 (Exact Date)   SpO2 97%   BMI 46.04 kg/m  Gen: In no acute distress, alert, cooperative with exam, well groomed, obese HEENT: NCAT, EOMI, PERRL CV: Regular rate and rhythm, normal S1/S2,  no murmur Resp: Clear to auscultation bilaterally, no wheezes, non-labored Abd: Soft, Non Tender, Non Distended, bowel sounds present, no guarding or organomegaly, well healed midline incisional scar Ext: No edema, warm and well perfused Neuro: Alert and oriented, No gross deficits, normal gait Psych: Normal mood and affect   Assessment/Plan:  Annual physical exam Doing well overall. Does need to lose weight to be at healthier BMI (see other A/P). Is getting pap smear at her gynecologist's office later this month - TDap vaccine today - CMP, CBC, Lipid Panel  Menorrhagia Notes she is following up with her gynecologist later this month. Has had chronic anemia - CBC  Anemia CBC showing hgb of 9.8. Advised that patient come back for anemia panel. 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  - Anemia panel ordered  Hypertension Normotensive without any medications. Technically not hypertensive.  - Advised patient to check BP at home with cuff and notify me if any values are >140/90 - Continue to follow   Obesity Body mass index is 46.04 kg/m..  -Discussed healthier eating habits and increasing exercise.  - Can try nutrition consult in future if patient agreeable  Tinea pedis Likely tinea pedis of left foot - Lotrimin cream - Follow up in 2 weeks if no improvement   Orders Placed This Encounter  Procedures  . CBC with Differential  . Comprehensive metabolic panel    Order Specific Question:   Has the patient fasted?    Answer:   No  . Lipid Panel    Order Specific Question:   Has the patient fasted?    Answer:   No  . Anemia panel    Standing Status:   Future    Standing Expiration Date:   09/25/2017  . POCT urinalysis dipstick    Meds ordered this encounter  Medications  . EPINEPHrine (EPIPEN 2-PAK) 0.3 mg/0.3 mL IJ SOAJ injection    Sig: USE  AS DIRECTED FOR SEVERE ALLERGIC REACTION    Dispense:  2 Device    Refill:  1    DISPENSE MYLAN GENERIC IF PREFERRED BY INSURANCE  . Tdap (BOOSTRIX) injection 0.5 mL  . clotrimazole (LOTRIMIN) 1 % cream    Sig: Apply 1 application topically 2 (two) times daily.    Dispense:  30 g    Refill:  0     Smitty Cords, MD [place], PGY-2

## 2016-09-22 ENCOUNTER — Encounter: Payer: Self-pay | Admitting: Family Medicine

## 2016-09-22 ENCOUNTER — Ambulatory Visit (INDEPENDENT_AMBULATORY_CARE_PROVIDER_SITE_OTHER): Payer: BC Managed Care – PPO | Admitting: Family Medicine

## 2016-09-22 VITALS — BP 124/88 | HR 78 | Temp 98.4°F | Ht 64.0 in | Wt 268.2 lb

## 2016-09-22 DIAGNOSIS — I10 Essential (primary) hypertension: Secondary | ICD-10-CM | POA: Diagnosis not present

## 2016-09-22 DIAGNOSIS — E669 Obesity, unspecified: Secondary | ICD-10-CM | POA: Diagnosis not present

## 2016-09-22 DIAGNOSIS — N924 Excessive bleeding in the premenopausal period: Secondary | ICD-10-CM

## 2016-09-22 DIAGNOSIS — B353 Tinea pedis: Secondary | ICD-10-CM | POA: Diagnosis not present

## 2016-09-22 DIAGNOSIS — D649 Anemia, unspecified: Secondary | ICD-10-CM

## 2016-09-22 DIAGNOSIS — Z0000 Encounter for general adult medical examination without abnormal findings: Secondary | ICD-10-CM

## 2016-09-22 DIAGNOSIS — R300 Dysuria: Secondary | ICD-10-CM | POA: Diagnosis not present

## 2016-09-22 LAB — POCT URINALYSIS DIP (MANUAL ENTRY)
Bilirubin, UA: NEGATIVE
Blood, UA: NEGATIVE
Glucose, UA: NEGATIVE
Ketones, POC UA: NEGATIVE
Leukocytes, UA: NEGATIVE
Nitrite, UA: NEGATIVE
PH UA: 6.5 (ref 5.0–8.0)
PROTEIN UA: NEGATIVE
Urobilinogen, UA: 0.2 (ref ?–2.0)

## 2016-09-22 MED ORDER — CLOTRIMAZOLE 1 % EX CREA: 1.0000 "application " | TOPICAL_CREAM | Freq: Two times a day (BID) | CUTANEOUS | 0 refills | Status: DC

## 2016-09-22 MED ORDER — TETANUS-DIPHTH-ACELL PERTUSSIS 5-2.5-18.5 LF-MCG/0.5 IM SUSP
0.5000 mL | Freq: Once | INTRAMUSCULAR | Status: AC
  Administered 2016-09-22: 0.5 mL via INTRAMUSCULAR

## 2016-09-22 MED ORDER — EPINEPHRINE 0.3 MG/0.3ML IJ SOAJ
INTRAMUSCULAR | 1 refills | Status: DC
Start: 2016-09-22 — End: 2017-11-20

## 2016-09-22 NOTE — Patient Instructions (Signed)
Thank you for coming in today, it was so nice to see you! Today we talked about:   Annual exam: You will need to have your pap smear done to screen for cervical cancer. I'm glad you are going to see Dr. Elly Modena later this month. We have given you your Tetanus vaccine while you're here.   Toes: I think you may have some fungus here. It could also just be dry skin. Try the Lotrimin cream and if it doesn't help after a couple weeks come back and see [place]  If we ordered any tests today, you will be notified via telephone of any abnormalities. If everything is normal you will get a letter in the mail.   If you have any questions or concerns, please do not hesitate to call the office at [phone]. You can also message me directly via MyChart.   Sincerely,  Smitty Cords, MD

## 2016-09-23 LAB — LIPID PANEL
Chol/HDL Ratio: 3.6 ratio (ref 0.0–4.4)
Cholesterol, Total: 186 mg/dL (ref 100–199)
HDL: 51 mg/dL (ref >39)
LDL CALC: 117 mg/dL — AB (ref 0–99)
Triglycerides: 90 mg/dL (ref 0–149)
VLDL CHOLESTEROL CAL: 18 mg/dL (ref 5–40)

## 2016-09-23 LAB — CBC WITH DIFFERENTIAL/PLATELET
BASOS: 1 %
Basophils Absolute: 0 10*3/uL (ref 0.0–0.2)
EOS (ABSOLUTE): 0.1 10*3/uL (ref 0.0–0.4)
EOS: 2 %
HEMOGLOBIN: 9.8 g/dL — AB (ref 11.1–15.9)
Hematocrit: 32.3 % — ABNORMAL LOW (ref 34.0–46.6)
IMMATURE GRANS (ABS): 0 10*3/uL (ref 0.0–0.1)
IMMATURE GRANULOCYTES: 0 %
LYMPHS: 26 %
Lymphocytes Absolute: 2.1 10*3/uL (ref 0.7–3.1)
MCH: 20.5 pg — ABNORMAL LOW (ref 26.6–33.0)
MCHC: 30.3 g/dL — ABNORMAL LOW (ref 31.5–35.7)
MCV: 68 fL — ABNORMAL LOW (ref 79–97)
MONOCYTES: 4 %
Monocytes Absolute: 0.3 10*3/uL (ref 0.1–0.9)
NEUTROS ABS: 5.5 10*3/uL (ref 1.4–7.0)
NEUTROS PCT: 67 %
Platelets: 381 10*3/uL — ABNORMAL HIGH (ref 150–379)
RBC: 4.77 x10E6/uL (ref 3.77–5.28)
RDW: 17 % — ABNORMAL HIGH (ref 12.3–15.4)
WBC: 8.1 10*3/uL (ref 3.4–10.8)

## 2016-09-25 DIAGNOSIS — B353 Tinea pedis: Secondary | ICD-10-CM | POA: Insufficient documentation

## 2016-09-25 NOTE — Assessment & Plan Note (Signed)
Normotensive without any medications. Technically not hypertensive.  - Advised patient to check BP at home with cuff and notify me if any values are >140/90 - Continue to follow

## 2016-09-25 NOTE — Assessment & Plan Note (Signed)
Body mass index is 46.04 kg/m..  -Discussed healthier eating habits and increasing exercise.  - Can try nutrition consult in future if patient agreeable

## 2016-09-25 NOTE — Assessment & Plan Note (Signed)
Notes she is following up with her gynecologist later this month. Has had chronic anemia - CBC

## 2016-09-25 NOTE — Assessment & Plan Note (Addendum)
CBC showing hgb of 9.8. Advised that patient come back for anemia panel. 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  - Anemia panel ordered

## 2016-09-25 NOTE — Assessment & Plan Note (Addendum)
Doing well overall. Does need to lose weight to be at healthier BMI (see other A/P). Is getting pap smear at her gynecologist's office later this month - TDap vaccine today - CMP, CBC, Lipid Panel

## 2016-09-25 NOTE — Assessment & Plan Note (Signed)
Likely tinea pedis of left foot - Lotrimin cream - Follow up in 2 weeks if no improvement

## 2016-09-28 ENCOUNTER — Other Ambulatory Visit: Payer: BC Managed Care – PPO

## 2016-09-28 DIAGNOSIS — D649 Anemia, unspecified: Secondary | ICD-10-CM

## 2016-09-29 LAB — ANEMIA PANEL
FOLATE, HEMOLYSATE: 372.5 ng/mL
FOLATE, RBC: 1132 ng/mL (ref >498)
Ferritin: 113 ng/mL (ref 15–150)
HEMATOCRIT: 32.9 % — AB (ref 34.0–46.6)
IRON SATURATION: 33 % (ref 15–55)
IRON: 88 ug/dL (ref 27–159)
Retic Ct Pct: 2.6 % (ref 0.6–2.6)
TIBC: 265 ug/dL (ref 250–450)
UIBC: 177 ug/dL (ref 131–425)
VITAMIN B 12: 321 pg/mL (ref 232–1245)

## 2016-10-06 ENCOUNTER — Encounter: Payer: Self-pay | Admitting: Obstetrics and Gynecology

## 2016-10-06 ENCOUNTER — Ambulatory Visit (INDEPENDENT_AMBULATORY_CARE_PROVIDER_SITE_OTHER): Payer: BC Managed Care – PPO | Admitting: Obstetrics and Gynecology

## 2016-10-06 VITALS — BP 142/77 | HR 71 | Wt 270.0 lb

## 2016-10-06 DIAGNOSIS — Z01419 Encounter for gynecological examination (general) (routine) without abnormal findings: Secondary | ICD-10-CM | POA: Diagnosis not present

## 2016-10-06 NOTE — Progress Notes (Signed)
Subjective:     Jodi Saunders is a 46 y.o. female G38P1021 with BMI 46 who is here for a comprehensive physical exam. The patient reports no problems. She has been experiencing a monthly 4 day period with some dysmenorrhea managed with ibuprofen. She is without any other complaints.  Past Medical History:  Diagnosis Date  . Abnormal uterine bleeding 04/21/2013  . Allergy   . Anemia   . BACK PAIN 08/05/2009   Qualifier: Diagnosis of  By: [name]  MD, Hosie Poisson    . CHOLELITHIASIS, SYMPTOMATIC 12/28/2009   Qualifier: Diagnosis of  By: [name]  MD, [name]    . Earache symptoms in left ear 06/09/2013   Completed amox. Completed Augmentin. Still with pain.  [facility] evaluated at [name] on 06/24/2013. No s/s to suggest ear infection. 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  F/u prn.     . Hip pain 08/28/2011  . Multiple allergies   . Tibialis tendinitis 11/19/2007   Qualifier: Diagnosis of  By: [place] Desanctis MD, Jenny Reichmann     Past Surgical History:  Procedure Laterality Date  . CHOLECYSTECTOMY  2011  . HYSTEROSCOPY WITH NOVASURE  06/03/2012   Procedure: HYSTEROSCOPY WITH NOVASURE;  Surgeon: [name], MD;  Location: [facility];  Service: Gynecology;  Laterality: N/A;  . LAPAROSCOPIC APPENDECTOMY N/A 12/14/2015   Procedure: APPENDECTOMY LAPAROSCOPIC CONVERTED TO OPEN;  Surgeon: Autumn Messing III, MD;  Location: [facility];  Service: General;  Laterality: N/A;  . MULTIPLE TOOTH EXTRACTIONS    . [place]  2002  . TONSILLECTOMY  2008  . VAGINAL DELIVERY  1992  . WISDOM TOOTH EXTRACTION     Family History  Problem Relation Age of Onset  . Diabetes Father   . Hypertension Father   . Diabetes Paternal Grandmother   . Hypertension Paternal Grandmother   . Diabetes Paternal Grandfather   . Hypertension Paternal Grandfather   . Allergic rhinitis Neg Hx   . Angioedema Neg Hx   . Asthma Neg Hx   . Atopy Neg Hx   . Eczema Neg Hx   . Immunodeficiency Neg Hx   . Urticaria  Neg Hx     Social History   Social History  . Marital status: Single    Spouse name: N/A  . Number of children: N/A  . Years of education: N/A   Occupational History  . Not on file.   Social History Main Topics  . Smoking status: Never Smoker  . Smokeless tobacco: Never Used  . Alcohol use Yes     Comment: occasionally  . Drug use: No  . Sexual activity: Yes    Birth control/ protection: Pill   Other Topics Concern  . Not on file   Social History Narrative  . No narrative on file   Health Maintenance  Topic Date Due  . PAP SMEAR  09/04/2016  . INFLUENZA VACCINE  01/17/2017  . TETANUS/TDAP  09/23/2026  . HIV Screening  Completed       Review of Systems Pertinent items are noted in HPI.   Objective:  Blood pressure (!) 142/77, pulse 71, weight 270 lb (122.5 kg), last menstrual period 09/17/2016.     GENERAL: Well-developed, well-nourished female in no acute distress.  HEENT: Normocephalic, atraumatic. Sclerae anicteric.  NECK: Supple. Normal thyroid.  LUNGS: Clear to auscultation bilaterally.  HEART: Regular rate and rhythm. BREASTS: Symmetric in size. No palpable masses or lymphadenopathy, skin changes, or nipple  drainage. ABDOMEN: Soft, nontender, nondistended. No organomegaly. PELVIC: Normal external female genitalia. Vagina is pink and rugated.  Normal discharge. Normal appearing cervix. Uterus is normal in size. No adnexal mass or tenderness. EXTREMITIES: No cyanosis, clubbing, or edema, 2+ distal pulses.    Assessment:    Healthy female exam.      Plan:    pap smear collected Patient with mammogram in 08/2016 and negative breast biopsy Patient noted to be anemic on 4/6 labs. Advised to consume iron rich food and to take a multivitamin containing iron Patient will be contacted with abnormal results RTC prn See After Visit Summary for Counseling Recommendations

## 2016-10-06 NOTE — Patient Instructions (Signed)
Iron Deficiency Anemia, Adult Iron deficiency anemia is a condition in which the concentration of red blood cells or hemoglobin in the blood is below normal because of too little iron. Hemoglobin is a substance in red blood cells that carries oxygen to the body's tissues. When the concentration of red blood cells or hemoglobin is too low, not enough oxygen reaches these tissues. Iron deficiency anemia is usually long-lasting (chronic) and it develops over time. It may or may not cause symptoms. It is a common type of anemia. What are the causes? This condition may be caused by:  Not enough iron in the diet.  Blood loss caused by bleeding in the intestine.  Blood loss from a gastrointestinal condition like Crohn disease.  Frequent blood draws, such as from blood donation.  Abnormal absorption in the gut.  Heavy menstrual periods in women.  Cancers of the gastrointestinal system, such as colon cancer. What are the signs or symptoms? Symptoms of this condition may include:  Fatigue.  Headache.  Pale skin, lips, and nail beds.  Poor appetite.  Weakness.  Shortness of breath.  Dizziness.  Cold hands and feet.  Fast or irregular heartbeat.  Irritability. This is more common in severe anemia.  Rapid breathing. This is more common in severe anemia. Mild anemia may not cause any symptoms. How is this diagnosed? This condition is diagnosed based on:  Your medical history.  A physical exam.  Blood tests. You may have additional tests to find the underlying cause of your anemia, such as:  Testing for blood in the stool (fecal occult blood test).  A procedure to see inside your colon and rectum (colonoscopy).  A procedure to see inside your esophagus and stomach (endoscopy).  A test in which cells are removed from bone marrow (bone marrow aspiration) or fluid is removed from the bone marrow to be examined (biopsy). This is rarely needed. How is this treated? This  condition is treated by correcting the cause of your iron deficiency. Treatment may involve:  Adding iron-rich foods to your diet.  Taking iron supplements. If you are pregnant or breastfeeding, you may need to take extra iron because your normal diet usually does not provide the amount of iron that you need.  Increasing vitamin C intake. Vitamin C helps your body absorb iron. Your health care provider may recommend that you take iron supplements along with a glass of orange juice or a vitamin C supplement.  Medicines to make heavy menstrual flow lighter.  Surgery. You may need repeat blood tests to determine whether treatment is working. Depending on the underlying cause, the anemia should be corrected within 2 months of starting treatment. If the treatment does not seem to be working, you may need more testing. Follow these instructions at home: Medicines  Take over-the-counter and prescription medicines only as told by your health care provider. This includes iron supplements and vitamins.  If you cannot tolerate taking iron supplements by mouth, talk with your health care provider about taking them through a vein (intravenously) or an injection into a muscle.  For the best iron absorption, you should take iron supplements when your stomach is empty. If you cannot tolerate them on an empty stomach, you may need to take them with food.  Do not drink milk or take antacids at the same time as your iron supplements. Milk and antacids may interfere with iron absorption.  Iron supplements can cause constipation. To prevent constipation, include fiber in your diet as told   by your health care provider. A stool softener may also be recommended. Eating and drinking  Talk with your health care provider before changing your diet. He or she may recommend that you eat foods that contain a lot of iron, such as:  Liver.  Low-fat (lean) beef.  Breads and cereals that have iron added to them (are  fortified).  Eggs.  Dried fruit.  Dark green, leafy vegetables.  To help your body use the iron from iron-rich foods, eat those foods at the same time as fresh fruits and vegetables that are high in vitamin C. Foods that are high in vitamin C include:  Oranges.  Peppers.  Tomatoes.  Mangoes.  Drinkenoughfluid to keep your urine clear or pale yellow. General instructions  Return to your normal activities as told by your health care provider. Ask your health care provider what activities are safe for you.  Practice good hygiene. Anemia can make you more prone to illness and infection.  Keep all follow-up visits as told by your health care provider. This is important. Contact a health care provider if:  You feel nauseous or you vomit.  You feel weak.  You have unexplained sweating.  You develop symptoms of constipation, such as:  Having fewer than three bowel movements a week.  Straining to have a bowel movement.  Having stools that are hard, dry, or larger than normal.  Feeling full or bloated.  Pain in the lower abdomen.  Not feeling relief after having a bowel movement. Get help right away if:  You faint. If this happens, do not drive yourself to the hospital. Call your local emergency services (911 in the [place]  You have chest pain.  You have shortness of breath that:  Is severe.  Gets worse with physical activity.  You have a rapid heartbeat.  You become light-headed when getting up from a sitting or lying down position. This information is not intended to replace advice given to you by your health care provider. Make sure you discuss any questions you have with your health care provider. Document Released: 06/02/2000 Document Revised: 02/23/2016 Document Reviewed: 02/23/2016 Elsevier Interactive Patient Education  2017 Elsevier Inc.  

## 2016-10-12 LAB — CYTOLOGY - PAP
Adequacy: ABSENT
Diagnosis: NEGATIVE
HPV: DETECTED — AB

## 2016-12-15 ENCOUNTER — Encounter: Payer: Self-pay | Admitting: General Practice

## 2016-12-15 ENCOUNTER — Encounter: Payer: Self-pay | Admitting: *Deleted

## 2017-01-08 ENCOUNTER — Encounter: Payer: Self-pay | Admitting: Internal Medicine

## 2017-01-08 ENCOUNTER — Ambulatory Visit (INDEPENDENT_AMBULATORY_CARE_PROVIDER_SITE_OTHER): Payer: BC Managed Care – PPO | Admitting: Internal Medicine

## 2017-01-08 VITALS — BP 138/88 | HR 84 | Temp 99.1°F | Ht 64.0 in | Wt 266.0 lb

## 2017-01-08 DIAGNOSIS — R0981 Nasal congestion: Secondary | ICD-10-CM

## 2017-01-08 NOTE — Progress Notes (Signed)
[facility] Progress Note  Subjective:  SYNA GAD is a 46 y.o. female with history of HTN not presently treated who presents for concerns of sore throat, nasal congestion.  #Cold-like symptoms: - Patient began feeling unwell Saturday. She was in the [place] for a mission trip. She had sore, dry throat, nasal congestion, R ear pain, and subjective fever. - An NP on her trip examined her and said it looked like she had an ear infection of the R ear and that she might have strep throat. She started taking amoxicillin 500 mg twice daily from supplies available on the trip. - Patient did not have direct contact with patients on the trip. - Associated symptoms of fatigue and decreased appetite. Also notes that her eyes felt heavy.  - Sore throat now improving but ear still hurts. ROS: No further fevers, no n/v/d, no rash   Allergies  Allergen Reactions  . Peanut-Containing Drug Products     Facial swelling  . Other     Tree nuts  . Sulfonamide Derivatives Nausea And Vomiting    REACTION: nausea, lightheaded  . Latex Rash    Blisters    Objective: Blood pressure 138/88, pulse 84, temperature 99.1 F (37.3 C), temperature source Oral, height 5\' 4"  (1.626 m), weight 266 lb (120.7 kg), SpO2 99 %. Body mass index is 45.66 kg/m. Constitutional: Obese female, in NAD HENT: MMM, normal posterior oropharynx. L TM normal. Fluid behind R TM but no erythema. Copious cerumen of R ear--able to be cleared. Tender to percussion over L maxillary sinus.  Cardiovascular: RRR, S1, S2, no m/r/g.  Pulmonary/Chest: Effort normal and breath sounds normal. No respiratory distress.  Abdominal: Soft. +BS, NT, ND Skin: Skin is warm and dry. No rash noted. No erythema.  Psychiatric: Normal mood and affect.  Vitals reviewed  Assessment/Plan: Sinus congestion - Suspect URI given quick onset of symptoms, now improving. However, improvement coincides with starting antibiotics.  Patient may have been developing early sinus infection with evidence of Eustachian tube dysfunction. - Recommended patient complete course of amoxicillin 500 mg x 10 days (already had medication). - R ear cleaned of cerumen. Advised using debrox prn.  Follow-up prn.  Olene Floss, MD [place], PGY-3

## 2017-01-08 NOTE — Patient Instructions (Signed)
Ms. Jodi Saunders,  Please continue your amoxicillin 500 mg twice daily for 10 day course.  Drink plenty of fluids and see [place] back if you have trouble taking liquids or solids.  Your right eardrum has some fluid behind it but does not look red, but given your other symptoms, you may have been on your way to developing a sinus infection.  Best, Dr. Ola Spurr

## 2017-01-09 DIAGNOSIS — R0981 Nasal congestion: Secondary | ICD-10-CM | POA: Insufficient documentation

## 2017-01-09 NOTE — Assessment & Plan Note (Signed)
-   Suspect URI given quick onset of symptoms, now improving. However, improvement coincides with starting antibiotics. Patient may have been developing early sinus infection with evidence of Eustachian tube dysfunction. - Recommended patient complete course of amoxicillin 500 mg x 10 days (already had medication). - R ear cleaned of cerumen. Advised using debrox prn.

## 2017-09-19 ENCOUNTER — Encounter: Payer: Self-pay | Admitting: Obstetrics and Gynecology

## 2017-09-19 ENCOUNTER — Ambulatory Visit (INDEPENDENT_AMBULATORY_CARE_PROVIDER_SITE_OTHER): Payer: BC Managed Care – PPO | Admitting: Obstetrics and Gynecology

## 2017-09-19 VITALS — BP 139/89 | HR 80 | Ht 64.0 in | Wt 252.0 lb

## 2017-09-19 DIAGNOSIS — Z1151 Encounter for screening for human papillomavirus (HPV): Secondary | ICD-10-CM | POA: Diagnosis not present

## 2017-09-19 DIAGNOSIS — Z124 Encounter for screening for malignant neoplasm of cervix: Secondary | ICD-10-CM | POA: Diagnosis not present

## 2017-09-19 DIAGNOSIS — Z01419 Encounter for gynecological examination (general) (routine) without abnormal findings: Secondary | ICD-10-CM

## 2017-09-19 NOTE — Progress Notes (Signed)
Subjective:     Jodi Saunders is a 47 y.o. female [id number] with LMP 09/06/2017 and BMI 43 who is here for a comprehensive physical exam. The patient reports no problems. She reports a monthly cycle lasting 5 days, heavier in flow in comparison to last year but nothing comparable to prior her ablation. She denies any pelvic pain or abnormal discharge. She reports feeling a bulge at her appendectomy incision. The bulge is not tender. She is enrolled in weight watchers with work and reports a 20 lb weight loss since October  Past Medical History:  Diagnosis Date  . Abnormal uterine bleeding 04/21/2013  . Allergy   . Anemia   . BACK PAIN 08/05/2009   Qualifier: Diagnosis of  By: [name]  MD, Hosie Poisson    . CHOLELITHIASIS, SYMPTOMATIC 12/28/2009   Qualifier: Diagnosis of  By: [name]  MD, [name]    . Earache symptoms in left ear 06/09/2013   Completed amox. Completed Augmentin. Still with pain.  Patient evaluated at [name] on 06/24/2013. No s/s to suggest ear infection. 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  F/u prn.     . Hip pain 08/28/2011  . Multiple allergies   . Tibialis tendinitis 11/19/2007   Qualifier: Diagnosis of  By: Mize Desanctis MD, Jenny Reichmann     Past Surgical History:  Procedure Laterality Date  . CHOLECYSTECTOMY  2011  . HYSTEROSCOPY WITH NOVASURE  06/03/2012   Procedure: HYSTEROSCOPY WITH NOVASURE;  Surgeon: [name], MD;  Location: [facility];  Service: Gynecology;  Laterality: N/A;  . LAPAROSCOPIC APPENDECTOMY N/A 12/14/2015   Procedure: APPENDECTOMY LAPAROSCOPIC CONVERTED TO OPEN;  Surgeon: Autumn Messing III, MD;  Location: [facility];  Service: General;  Laterality: N/A;  . MULTIPLE TOOTH EXTRACTIONS    . [place]  2002  . TONSILLECTOMY  2008  . VAGINAL DELIVERY  1992  . WISDOM TOOTH EXTRACTION     Family History  Problem Relation Age of Onset  . Diabetes Father   . Hypertension Father   . Diabetes Paternal Grandmother   . Hypertension  Paternal Grandmother   . Diabetes Paternal Grandfather   . Hypertension Paternal Grandfather   . Allergic rhinitis Neg Hx   . Angioedema Neg Hx   . Asthma Neg Hx   . Atopy Neg Hx   . Eczema Neg Hx   . Immunodeficiency Neg Hx   . Urticaria Neg Hx     Social History   Socioeconomic History  . Marital status: Single    Spouse name: Not on file  . Number of children: Not on file  . Years of education: Not on file  . Highest education level: Not on file  Occupational History  . Not on file  Social Needs  . Financial resource strain: Not on file  . Food insecurity:    Worry: Not on file    Inability: Not on file  . Transportation needs:    Medical: Not on file    Non-medical: Not on file  Tobacco Use  . Smoking status: Never Smoker  . Smokeless tobacco: Never Used  Substance and Sexual Activity  . Alcohol use: Yes    Comment: occasionally  . Drug use: No  . Sexual activity: Yes    Birth control/protection: Pill  Lifestyle  . Physical activity:    Days per week: Not on file    Minutes per session: Not on file  . Stress: Not on file  Relationships  . Social connections:    Talks on phone: Not on file    Gets together: Not on file    Attends religious service: Not on file    Active member of club or organization: Not on file    Attends meetings of clubs or organizations: Not on file    Relationship status: Not on file  . Intimate partner violence:    Fear of current or ex partner: Not on file    Emotionally abused: Not on file    Physically abused: Not on file    Forced sexual activity: Not on file  Other Topics Concern  . Not on file  Social History Narrative  . Not on file   Health Maintenance  Topic Date Due  . INFLUENZA VACCINE  01/17/2018  . PAP SMEAR  10/07/2019  . TETANUS/TDAP  09/23/2026  . HIV Screening  Completed       Review of Systems Pertinent items are noted in HPI.   Objective:  Blood pressure 139/89, pulse 80, height 5\' 4"  (1.626 m),  weight 252 lb (114.3 kg), last menstrual period 09/06/2017.     GENERAL: Well-developed, well-nourished female in no acute distress.  HEENT: Normocephalic, atraumatic. Sclerae anicteric.  NECK: Supple. Normal thyroid.  LUNGS: Clear to auscultation bilaterally.  HEART: Regular rate and rhythm. BREASTS: Symmetric in size, large. No palpable masses or lymphadenopathy, skin changes, or nipple drainage. ABDOMEN: Soft, nontender, nondistended. Midline, infraumbilical incision healed well. Palpable small hernia approximately 2 cm appreciated with coughing PELVIC: Normal external female genitalia. Vagina is pink and rugated.  Normal discharge. Normal appearing cervix. Uterus is normal in size. No adnexal mass or tenderness. EXTREMITIES: No cyanosis, clubbing, or edema, 2+ distal pulses.    Assessment:    Healthy female exam.      Plan:    Pap smear collected Screening mammogram scheduled 10/11/2017 Patient scheduled to see PCP next week and will have fasting labs collected  Patient will be contacted with abnormal results Encouraged her to continue her weight loss endeavors Follow up with general surgery if hernia becomes problematic See After Visit Summary for Counseling Recommendations

## 2017-09-21 LAB — CYTOLOGY - PAP
Adequacy: ABSENT
DIAGNOSIS: NEGATIVE
HPV (WINDOPATH): NOT DETECTED

## 2017-09-27 NOTE — Progress Notes (Signed)
Subjective:  Jodi Saunders is a 47 y.o. year old female with PMH of Obesity, HTN, anemia who presents to office today for an annual physical examination.  Concerns today include:  1. Concerns about Vitamin Deficiencies and low thyroid: Patient is concerned that her vitamin D is low as well as her thyroid.  She notes that she talked to 1 of her friends who also had low vitamin D.  She would like to get this checked.  She is worried that may be her thyroid is abnormal as well, wants to get checked form for her peace of mind.  She denies any fatigue, depression, weight gain, weight loss, cold or heat intolerance, history of osteopenia or osteoporosis.   Review of Systems  Constitutional: Negative for fever and weight loss.  HENT: Negative for ear pain, hearing loss and sinus pain.   Eyes: Negative for blurred vision.  Respiratory: Negative for cough, shortness of breath and wheezing.   Cardiovascular: Negative for chest pain and leg swelling.  Gastrointestinal: Negative for abdominal pain, blood in stool, constipation, diarrhea, heartburn, melena, nausea and vomiting.  Genitourinary: Negative for dysuria and frequency.  Musculoskeletal: Negative for back pain and joint pain.  Skin: Negative for rash.  Neurological: Negative for dizziness, tingling, focal weakness and headaches.  Psychiatric/Behavioral: Negative for depression and suicidal ideas.    General Healthcare: Medication Compliance: Yes Dx Hypertension: No used to  Dx Hyperlipidemia: Yes Diabetes: No Dx Obesity: Yes Weight Loss: Yes, is decreasing carbohydrates Physical Activity: No Urinary Incontinence: No Menstrual hx: LMP September 06, 2017. Gets periods monhtly . 5-7 days long, has to go through about 1-2 pads a day Last dental exam: A couple months  Social:  reports that she has never smoked. She has never used smokeless tobacco. Driving: Drives herself, wears a seatbelt Alcohol Use: Rarely, 1 drink a year Tobacco No    Other Drugs: No  Support and Life at Home: Lives with her mother and father  Advanced Directives: no Work: Network engineer job at [employer]  Cancer:  Colorectal >> Colonoscopy: not due yet Lung >> Tobacco Use: N/A Cervical/Endometrial >>  - Postmenopausal: No  - Hysterectomy: NO - Vaginal Bleeding: Only with periods Skin >> Suspicious lesions: No   Past Medical History Past Medical History:  Diagnosis Date  . Abnormal uterine bleeding 04/21/2013  . Allergy   . Anemia   . BACK PAIN 08/05/2009   Qualifier: Diagnosis of  By: [name]  MD, Hosie Poisson    . CHOLELITHIASIS, SYMPTOMATIC 12/28/2009   Qualifier: Diagnosis of  By: [name]  MD, [name]    . Earache symptoms in left ear 06/09/2013   Completed amox. Completed Augmentin. Still with pain.  [facility] at [name] on 06/24/2013. No s/s to suggest ear infection. 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  F/u prn.     . Hip pain 08/28/2011  . Multiple allergies   . Tibialis tendinitis 11/19/2007   Qualifier: Diagnosis of  By: Hanna Desanctis MD, [facility]     Patient Active Problem List   Diagnosis Date Noted  . Sinus congestion 01/09/2017  . Tinea pedis 09/25/2016  . Annual physical exam 09/30/2014  . Obesity 09/30/2014  . ASCUS (atypical squamous cells of undetermined significance) on Pap smear 09/06/2012  . Menorrhagia 10/11/2011  . Congenital pes planus 11/19/2007  . OBESITY, NOS 08/16/2006  . Anemia 08/16/2006   Medications- reviewed and updated Current Outpatient Medications  Medication Sig Dispense Refill  .  Biotin 1000 MCG tablet Take 1,000 mcg by mouth 3 (three) times daily.    . cetirizine (ZYRTEC) 10 MG tablet Take 10 mg by mouth daily as needed.     . magnesium citrate SOLN Take 1 Bottle by mouth once.    . cholecalciferol (VITAMIN D) 1000 units tablet Take 1 tablet (1,000 Units total) by mouth daily. 30 tablet 3  . EPINEPHrine (EPIPEN 2-PAK) 0.3 mg/0.3 mL IJ SOAJ injection USE AS DIRECTED FOR SEVERE  ALLERGIC REACTION (Patient not taking: Reported on 09/28/2017) 2 Device 1   No current facility-administered medications for this visit.     Objective: BP 122/76   Pulse 92   Temp 98.3 F (36.8 C) (Oral)   Ht 5\' 4"  (1.626 m)   Wt 249 lb 9.6 oz (113.2 kg)   LMP 09/06/2017 (Exact Date)   SpO2 96%   BMI 42.84 kg/m  Gen: In no acute distress, alert, cooperative with exam, well groomed HEENT: NCAT, EOMI, PERRL CV: Regular rate and rhythm, normal S1/S2, no murmur Resp: Clear to auscultation bilaterally, no wheezes, non-labored Abd: Soft, Non Tender, Non Distended, bowel sounds present, no guarding or organomegaly Ext: No edema, warm and well perfused Neuro: Alert and oriented, No gross deficits, normal gait Psych: Normal mood and affect   Assessment/Plan:  1. Annual physical exam: Patient doing well overall.  Completed her physical exam today.  She is up-to-date on all of her health maintenance items.  Last Pap smear earlier this month and was normal, however transformation zone was not present.   -Follow-up in 1 year for next annual exam, can consider repeating Pap smear at that time  2. Anemia: History of anemia.  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  Previously had anemia was thought to multifactorial from thalassemia as well as menorrhagia.  Had a endometrial ablation in 2016.  Menorrhagia has resolved.  CBC showing hemoglobin of 10.2.  Stable from previous. -Can consider anemia panel in the future however most likely from thalassemia -Continue to monitor yearly  3. Hyperlipidemia, unspecified hyperlipidemia type The 10-year ASCVD risk score Mikey Bussing DC Jr., et al., 2013) is: 1.2%   Values used to calculate the score:     Age: 79 years     Sex: Female     Is Non-Hispanic African American: Yes     Diabetic: No     Tobacco smoker: No     Systolic Blood Pressure: 696 mmHg     Is BP treated: No     HDL Cholesterol: 46 mg/dL     Total Cholesterol: 173 mg/dL - Lipid panel  today showing mildly elevated LDL of 106, encouraged decreasing fried and fatty foods from diet  4. Encounter for vitamin deficiency screening: Vitamin D low at 12.2 - VITAMIN D 25 Hydroxy (Vit-D Deficiency, Fractures) -Begin 1000 IUs of vitamin D daily, recheck in 3 to 6 months  5. Screening for hypothyroidism - TSH  6. Hyperglycemia: Seen on patient's previous BMP.  Will screen for diabetes - POCT glycosylated hemoglobin (Hb A1C); Future   Orders Placed This Encounter  Procedures  . CBC  . Lipid panel  . Basic Metabolic Panel  . VITAMIN D 25 Hydroxy (Vit-D Deficiency, Fractures)  . TSH  . POCT glycosylated hemoglobin (Hb A1C)    Standing Status:   Future    Number of Occurrences:   1    Standing Expiration Date:   10/03/2018    Meds ordered this encounter  Medications  . cholecalciferol (VITAMIN  D) 1000 units tablet    Sig: Take 1 tablet (1,000 Units total) by mouth daily.    Dispense:  30 tablet    Refill:  3     Smitty Cords, MD [place], PGY-3

## 2017-09-28 ENCOUNTER — Other Ambulatory Visit: Payer: Self-pay

## 2017-09-28 ENCOUNTER — Encounter: Payer: Self-pay | Admitting: Family Medicine

## 2017-09-28 ENCOUNTER — Ambulatory Visit (INDEPENDENT_AMBULATORY_CARE_PROVIDER_SITE_OTHER): Payer: BC Managed Care – PPO | Admitting: Family Medicine

## 2017-09-28 VITALS — BP 122/76 | HR 92 | Temp 98.3°F | Ht 64.0 in | Wt 249.6 lb

## 2017-09-28 DIAGNOSIS — Z1321 Encounter for screening for nutritional disorder: Secondary | ICD-10-CM

## 2017-09-28 DIAGNOSIS — Z0001 Encounter for general adult medical examination with abnormal findings: Secondary | ICD-10-CM | POA: Diagnosis not present

## 2017-09-28 DIAGNOSIS — Z1329 Encounter for screening for other suspected endocrine disorder: Secondary | ICD-10-CM

## 2017-09-28 DIAGNOSIS — E785 Hyperlipidemia, unspecified: Secondary | ICD-10-CM

## 2017-09-28 DIAGNOSIS — R739 Hyperglycemia, unspecified: Secondary | ICD-10-CM | POA: Diagnosis not present

## 2017-09-28 DIAGNOSIS — Z0000 Encounter for general adult medical examination without abnormal findings: Secondary | ICD-10-CM

## 2017-09-28 DIAGNOSIS — D649 Anemia, unspecified: Secondary | ICD-10-CM | POA: Diagnosis not present

## 2017-09-28 NOTE — Patient Instructions (Signed)
Thank you for coming in today, it was so nice to see you! Today we talked about:    We did your annual physical today.  You are doing great! I encourage you to walk at least 3 times a week for 10-20 minutes at a time as this will help you improve your BMI  We are checking blood work today, I will call you if anything is abnormal.  Please follow up next year for your physical.   If we ordered any tests today, you will be notified via telephone of any abnormalities. If everything is normal you will get a letter in the mail.   If you have any questions or concerns, please do not hesitate to call the office at [phone]. You can also message me directly via MyChart.   Sincerely,  Smitty Cords, MD

## 2017-09-29 LAB — BASIC METABOLIC PANEL
BUN/Creatinine Ratio: 14 (ref 9–23)
BUN: 11 mg/dL (ref 6–24)
CHLORIDE: 101 mmol/L (ref 96–106)
CO2: 24 mmol/L (ref 20–29)
Calcium: 9.5 mg/dL (ref 8.7–10.2)
Creatinine, Ser: 0.76 mg/dL (ref 0.57–1.00)
GFR calc non Af Amer: 94 mL/min/{1.73_m2} (ref >59)
GFR, EST AFRICAN AMERICAN: 109 mL/min/{1.73_m2} (ref >59)
GLUCOSE: 96 mg/dL (ref 65–99)
POTASSIUM: 4.5 mmol/L (ref 3.5–5.2)
Sodium: 137 mmol/L (ref 134–144)

## 2017-09-29 LAB — CBC
HEMATOCRIT: 32.5 % — AB (ref 34.0–46.6)
HEMOGLOBIN: 10.2 g/dL — AB (ref 11.1–15.9)
MCH: 21.5 pg — ABNORMAL LOW (ref 26.6–33.0)
MCHC: 31.4 g/dL — AB (ref 31.5–35.7)
MCV: 68 fL — ABNORMAL LOW (ref 79–97)
Platelets: 337 10*3/uL (ref 150–379)
RBC: 4.75 x10E6/uL (ref 3.77–5.28)
RDW: 14.9 % (ref 12.3–15.4)
WBC: 6.8 10*3/uL (ref 3.4–10.8)

## 2017-09-29 LAB — LIPID PANEL
CHOL/HDL RATIO: 3.8 ratio (ref 0.0–4.4)
Cholesterol, Total: 173 mg/dL (ref 100–199)
HDL: 46 mg/dL (ref >39)
LDL CALC: 106 mg/dL — AB (ref 0–99)
TRIGLYCERIDES: 103 mg/dL (ref 0–149)
VLDL Cholesterol Cal: 21 mg/dL (ref 5–40)

## 2017-09-29 LAB — TSH: TSH: 1.72 u[IU]/mL (ref 0.450–4.500)

## 2017-09-29 LAB — VITAMIN D 25 HYDROXY (VIT D DEFICIENCY, FRACTURES): VIT D 25 HYDROXY: 12.2 ng/mL — AB (ref 30.0–100.0)

## 2017-10-02 ENCOUNTER — Encounter: Payer: Self-pay | Admitting: Family Medicine

## 2017-10-02 ENCOUNTER — Other Ambulatory Visit (INDEPENDENT_AMBULATORY_CARE_PROVIDER_SITE_OTHER): Payer: BC Managed Care – PPO

## 2017-10-02 DIAGNOSIS — R739 Hyperglycemia, unspecified: Secondary | ICD-10-CM

## 2017-10-02 LAB — POCT GLYCOSYLATED HEMOGLOBIN (HGB A1C): HEMOGLOBIN A1C: 5.7

## 2017-10-02 MED ORDER — VITAMIN D 1000 UNITS PO TABS: 1000.0000 [IU] | ORAL_TABLET | Freq: Every day | ORAL | 3 refills | Status: AC

## 2017-10-04 ENCOUNTER — Telehealth: Payer: Self-pay | Admitting: Family Medicine

## 2017-10-04 DIAGNOSIS — R7303 Prediabetes: Secondary | ICD-10-CM

## 2017-10-04 NOTE — Telephone Encounter (Signed)
Called patient to discuss prediabetic A1C of 5.7. Discussed that at this time she will need to modify her nutrition. Discussed decreasing carbohydrates and sugary foods. Discussed that she would benefit from seeing a nutritionist. She was open to this. Gave her Dr. De Nurse phone number [phone]) to schedule an appointment. We can recheck her A1C in 3-6 months.   Smitty Cords, MD [place], PGY-3

## 2017-10-11 ENCOUNTER — Ambulatory Visit: Payer: BC Managed Care – PPO

## 2017-10-15 ENCOUNTER — Ambulatory Visit
Admission: RE | Admit: 2017-10-15 | Discharge: 2017-10-15 | Disposition: A | Discharge status: Home / Self Care | Payer: BC Managed Care – PPO | Source: Ambulatory Visit | Attending: Obstetrics and Gynecology | Admitting: Obstetrics and Gynecology

## 2017-10-15 DIAGNOSIS — Z01419 Encounter for gynecological examination (general) (routine) without abnormal findings: Secondary | ICD-10-CM

## 2017-11-20 ENCOUNTER — Other Ambulatory Visit: Payer: Self-pay | Admitting: Family Medicine

## 2018-03-19 ENCOUNTER — Ambulatory Visit: Payer: BC Managed Care – PPO | Admitting: Family Medicine

## 2018-03-19 VITALS — BP 148/88 | HR 83 | Temp 98.0°F | Ht 64.0 in | Wt 243.0 lb

## 2018-03-19 DIAGNOSIS — J069 Acute upper respiratory infection, unspecified: Secondary | ICD-10-CM | POA: Diagnosis not present

## 2018-03-19 DIAGNOSIS — Z1321 Encounter for screening for nutritional disorder: Secondary | ICD-10-CM | POA: Insufficient documentation

## 2018-03-19 DIAGNOSIS — E785 Hyperlipidemia, unspecified: Secondary | ICD-10-CM

## 2018-03-19 NOTE — Assessment & Plan Note (Signed)
LDL was greater than 100 at her last visit, and patient requests a lipid panel to be checked today to see if it has improved since then.

## 2018-03-19 NOTE — Progress Notes (Signed)
   Subjective:    Jodi Saunders - 47 y.o. female MRN [id number]  Date of birth: 1971/03/19  HPI  Jodi Saunders is here for a cold. - started on Thursday, September 26 - had throat pain and congestion as well as drainage - Saturday, she felt generalized aches and fatigue - Sunday, had a fever of 100.7 - has taken Epsom salt bath daily, which have been minimally helpful - feels some better today - reports runny nose, cough that is productive of mostly clear sputum with specks of yellow - has not taken any medicine, other than Vick's cough drops - thought at first her symptoms were due to allergies, but now she thinks they are due to a virus - appetite slowly improving -Would like a note for an exercise study that she is participating in to delineate what activity is deemed safe while she has this cold     Health Maintenance:  - declines flu shot after counseling on its benefits today There are no preventive care reminders to display for this patient.  -  reports that she has never smoked. She has never used smokeless tobacco. - Review of Systems: Per HPI. - Past Medical History: Patient Active Problem List   Diagnosis Date Noted  . Viral URI 03/19/2018  . Hyperlipidemia 03/19/2018  . Encounter for vitamin deficiency screening 03/19/2018  . Tinea pedis 09/25/2016  . Obesity 09/30/2014  . Congenital pes planus 11/19/2007  . OBESITY, NOS 08/16/2006  . Anemia 08/16/2006   - Medications: reviewed and updated   Objective:   Physical Exam BP (!) 148/88   Pulse 83   Temp 98 F (36.7 C) (Oral)   Ht 5\' 4"  (1.626 m)   Wt 243 lb (110.2 kg)   SpO2 99%   BMI 41.71 kg/m  Gen: NAD, alert, cooperative with exam HEENT: clear conjunctiva, oropharynx clear, supple neck, tympanic membranes clear bilaterally, no cervical or occipital lymphadenopathy CV: RRR, good S1/S2, no murmur Resp: CTABL, no wheezes, non-labored   Assessment & Plan:   Viral URI Counseled patient that her  symptoms are consistent with a URI.  Advised patient to use Tylenol and Motrin for subjective or actual fevers and body aches, Zyrtec to help with rhinorrhea, and hot tea or water with honey to help soothe her throat and reduce her cough.  Warned patient that her cough may last for up to a month and that it is often the last symptom of a cold to go away.  Provided with patient with a note to return to work on Thursday, and to reduce her activity in the exercise study that she is participating in from 50 minutes daily to 30 minutes daily for this week.  Also excuse her from participating and exercise for the study if she has a temperature of greater than 100.4.  Encounter for vitamin deficiency screening Patient is vitamin D deficient at a vitamin D level of 12.2 at her last check in April, so we will recheck today.  Although evidence is lacking that vitamin D supplementation produces significant benefits for her age group, if she continues to be significantly deficient at this check, she could start vitamin D supplementation if she desires.  Hyperlipidemia LDL was greater than 100 at her last visit, and patient requests a lipid panel to be checked today to see if it has improved since then.    Maia Breslow, M.D. 03/19/2018, 10:08 AM PGY-2, [place]

## 2018-03-19 NOTE — Patient Instructions (Addendum)
It was nice meeting you today Harriett Rush!  For your upper respiratory infection, take zyrtec to help reduce your runny nose, drink hot water with honey to help sooth your throat and reduce cough, and take tylenol and/or motrin to help relieve fever and body aches.  Your cough may linger for a few weeks.  Return to clinic if your symptoms have gotten worse in a week.  If you have any questions or concerns, please feel free to call the clinic.   Be well,  Dr. Shan Levans

## 2018-03-19 NOTE — Assessment & Plan Note (Addendum)
Counseled patient that her symptoms are consistent with a URI.  Advised patient to use Tylenol and Motrin for subjective or actual fevers and body aches, Zyrtec to help with rhinorrhea, and hot tea or water with honey to help soothe her throat and reduce her cough.  Warned patient that her cough may last for up to a month and that it is often the last symptom of a cold to go away.  Provided with patient with a note to return to work on Thursday, and to reduce her activity in the exercise study that she is participating in from 50 minutes daily to 30 minutes daily for this week.  Also excuse her from participating and exercise for the study if she has a temperature of greater than 100.4.

## 2018-03-19 NOTE — Assessment & Plan Note (Signed)
Patient is vitamin D deficient at a vitamin D level of 12.2 at her last check in April, so we will recheck today.  Although evidence is lacking that vitamin D supplementation produces significant benefits for her age group, if she continues to be significantly deficient at this check, she could start vitamin D supplementation if she desires.

## 2018-03-20 LAB — LIPID PANEL
CHOL/HDL RATIO: 3.4 ratio (ref 0.0–4.4)
Cholesterol, Total: 160 mg/dL (ref 100–199)
HDL: 47 mg/dL (ref >39)
LDL Calculated: 96 mg/dL (ref 0–99)
Triglycerides: 87 mg/dL (ref 0–149)
VLDL CHOLESTEROL CAL: 17 mg/dL (ref 5–40)

## 2018-03-20 LAB — VITAMIN D 25 HYDROXY (VIT D DEFICIENCY, FRACTURES): VIT D 25 HYDROXY: 22.2 ng/mL — AB (ref 30.0–100.0)

## 2018-03-26 ENCOUNTER — Telehealth: Payer: Self-pay | Admitting: Family Medicine

## 2018-03-26 NOTE — Telephone Encounter (Signed)
Called patient to inform her of her lipid and vitamin D results.  I told her that she could consider starting a statin in the future, since her LDL is borderline high at 96, but that she should discuss this with her PCP Dr. Owens Shark first.  She has been working on her diet and exercise, so this could improve her levels as well.  Also discussed that her vitamin D level had risen from 12.2 to 22.2 in the last 5 months, so the vitamin D she was taking was increasing her levels.  I told her that normal is usually considered greater than 30, but that no solid of the evidence exists that vitamin D supplementation is vital, even when patients have vitamin D levels less than 30.  I told her she could continue vitamin D supplementation since it would not be detrimental to her, however.

## 2018-09-11 ENCOUNTER — Ambulatory Visit: Payer: BC Managed Care – PPO | Admitting: Obstetrics and Gynecology

## 2018-09-26 ENCOUNTER — Other Ambulatory Visit: Payer: Self-pay

## 2018-09-26 ENCOUNTER — Telehealth (INDEPENDENT_AMBULATORY_CARE_PROVIDER_SITE_OTHER): Payer: BC Managed Care – PPO | Admitting: Family Medicine

## 2018-09-26 ENCOUNTER — Encounter: Payer: Self-pay | Admitting: Family Medicine

## 2018-09-26 DIAGNOSIS — Z800 Family history of malignant neoplasm of digestive organs: Secondary | ICD-10-CM

## 2018-09-26 DIAGNOSIS — E785 Hyperlipidemia, unspecified: Secondary | ICD-10-CM

## 2018-09-26 DIAGNOSIS — N920 Excessive and frequent menstruation with regular cycle: Secondary | ICD-10-CM

## 2018-09-26 DIAGNOSIS — Z6841 Body Mass Index (BMI) 40.0 and over, adult: Secondary | ICD-10-CM

## 2018-09-26 DIAGNOSIS — R030 Elevated blood-pressure reading, without diagnosis of hypertension: Secondary | ICD-10-CM

## 2018-09-26 NOTE — Progress Notes (Signed)
[facility] Telemedicine Visit  Patient consented to have visit conducted via video and telephone.  Encounter participants: Patient: Jodi Saunders  Provider: Martyn Malay   Chief Complaint: questions about health, abdominal pain   HPI:  Jodi Saunders is a 48 year old woman with a history of diet-controlled HTN, obesity, and beta-thalessemia trait presenting via virtual visit for multiple concerns.  Menopause Has a history of AUB, previously underwent ablation in December 2017. She reports continued regular menses. She is interested in seeing Dr. Elly Modena to discuss hysterectomy.  Denies change in menses or symptoms of anemia at this time.  Elevated blood pressure The patient has a history of diagnosed hypertension.  She was previously on hydrochlorothiazide.  She lost 35 pounds.  She has a strong family history of hypertension.  Her paternal uncle has dilated cardiomyopathy secondary to hypertensive disease.  Intermittent abdominal pain The patient reports a history of both a cholecystectomy and appendectomy.  For the last several days she has noted intermittent left lower quadrant pain.  This is worse when she is constipated.  She has not noticed a change in her bowel habits.  She considers herself to be African-American.  Her brother has a history of a bowel cancer.  He underwent a bowel resection due to what is presumed to be colon cancer last summer.  She reports both of her parents follow with Dr. Michail Sermon.  She would like to see him for his recommendation regarding timing of colonoscopy and further discussion  The patient is interested in a hearing test.  She denies any changes in her hearing no tinnitus or decrease in acoustic sensitivity.  The patient reports intermittent incontinence.  She reports that she has been sitting for long time drinking water when she goes to get up she will have a gush of fluid.  This happens about 1-2 times a week at most.  She  drinks 100 ounces of water a day.  She does drink caffeine and carbonated beverages.  She also eats citrus fruit   Patient is concerned about her bone health.  She reports her mother was told she had "strong bones".  The patient does not drink milk.  She does have a varied diet which includes cheese, leafy greens and other items rich in vitamin D and calcium.  Her chiropractor recommended she take vitamin D.  She does take this on a regular basis.  She does not take calcium  ROS: As above   Pertinent PMHx:   Reviewed problem list and updated this.  Please see that document Exam:  Respiratory: Speaking in full sentences.  She is breathing comfortably.  On video exam she is well-appearing no facial rash.  No alopecia.  Assessment/Plan:  Diagnoses and all orders for this visit:  Family history of colon cancer -     Ambulatory referral to Gastroenterology- recommend colonoscopy given family history. Additionally, some societies recommend CSY beginning at age 73.   Menorrhagia with regular cycle -     CBC; Future -     Ferritin; Future  Hyperlipidemia, unspecified hyperlipidemia type -     Lipid Panel; Future  Elevated blood pressure reading -     Basic Metabolic Panel; Future  Morbid obesity ([place]) -     Hemoglobin A1c; Future  Class 3 severe obesity without serious comorbidity with body mass index (BMI) of 40.0 to 44.9 in adult, unspecified obesity type ([place]) discussed dietary changes.  Hearing question monitor  Menorrhagia- will  talk with Dr. Elly Modena   Follow up in June/July for exam and check in. Consider vitamin D level at that time. Discussed risks benefits of calcium and D.   This encounter took place during the corona virus pandemic of 2020. Clinical practice guidelines, policies, recommendations, and protocols changed daily during this time. The patient was cared for in alignment with the current policies of the day.    Time spent on phone with patient: 41 minutes

## 2018-09-30 ENCOUNTER — Other Ambulatory Visit: Payer: Self-pay

## 2018-09-30 ENCOUNTER — Other Ambulatory Visit: Payer: BC Managed Care – PPO

## 2018-09-30 DIAGNOSIS — E785 Hyperlipidemia, unspecified: Secondary | ICD-10-CM

## 2018-09-30 DIAGNOSIS — R030 Elevated blood-pressure reading, without diagnosis of hypertension: Secondary | ICD-10-CM

## 2018-09-30 DIAGNOSIS — N920 Excessive and frequent menstruation with regular cycle: Secondary | ICD-10-CM

## 2018-10-01 LAB — HEMOGLOBIN A1C
Est. average glucose Bld gHb Est-mCnc: 117 mg/dL
Hgb A1c MFr Bld: 5.7 % — ABNORMAL HIGH (ref 4.8–5.6)

## 2018-10-01 LAB — CBC
Hematocrit: 32.9 % — ABNORMAL LOW (ref 34.0–46.6)
Hemoglobin: 10.2 g/dL — ABNORMAL LOW (ref 11.1–15.9)
MCH: 21.3 pg — ABNORMAL LOW (ref 26.6–33.0)
MCHC: 31 g/dL — ABNORMAL LOW (ref 31.5–35.7)
MCV: 69 fL — ABNORMAL LOW (ref 79–97)
Platelets: 362 10*3/uL (ref 150–450)
RBC: 4.78 x10E6/uL (ref 3.77–5.28)
RDW: 16.2 % — ABNORMAL HIGH (ref 11.7–15.4)
WBC: 6.7 10*3/uL (ref 3.4–10.8)

## 2018-10-01 LAB — LIPID PANEL
Chol/HDL Ratio: 3.5 ratio (ref 0.0–4.4)
Cholesterol, Total: 163 mg/dL (ref 100–199)
HDL: 46 mg/dL (ref >39)
LDL Calculated: 99 mg/dL (ref 0–99)
Triglycerides: 92 mg/dL (ref 0–149)
VLDL Cholesterol Cal: 18 mg/dL (ref 5–40)

## 2018-10-01 LAB — BASIC METABOLIC PANEL
BUN/Creatinine Ratio: 8 — ABNORMAL LOW (ref 9–23)
BUN: 6 mg/dL (ref 6–24)
CO2: 22 mmol/L (ref 20–29)
Calcium: 9.7 mg/dL (ref 8.7–10.2)
Chloride: 103 mmol/L (ref 96–106)
Creatinine, Ser: 0.8 mg/dL (ref 0.57–1.00)
GFR calc Af Amer: 102 mL/min/{1.73_m2} (ref >59)
GFR calc non Af Amer: 88 mL/min/{1.73_m2} (ref >59)
Glucose: 98 mg/dL (ref 65–99)
Potassium: 4.6 mmol/L (ref 3.5–5.2)
Sodium: 140 mmol/L (ref 134–144)

## 2018-10-01 LAB — FERRITIN: Ferritin: 144 ng/mL (ref 15–150)

## 2018-10-04 ENCOUNTER — Encounter: Payer: BC Managed Care – PPO | Admitting: Family Medicine

## 2018-10-07 ENCOUNTER — Other Ambulatory Visit: Payer: BC Managed Care – PPO

## 2018-11-18 ENCOUNTER — Other Ambulatory Visit: Payer: Self-pay | Admitting: Family Medicine

## 2018-11-18 DIAGNOSIS — Z1231 Encounter for screening mammogram for malignant neoplasm of breast: Secondary | ICD-10-CM

## 2018-12-02 ENCOUNTER — Encounter: Payer: Self-pay | Admitting: Family Medicine

## 2018-12-02 DIAGNOSIS — K635 Polyp of colon: Secondary | ICD-10-CM | POA: Insufficient documentation

## 2018-12-04 ENCOUNTER — Encounter: Payer: Self-pay | Admitting: Family Medicine

## 2018-12-16 ENCOUNTER — Telehealth (INDEPENDENT_AMBULATORY_CARE_PROVIDER_SITE_OTHER): Payer: BC Managed Care – PPO | Admitting: Advanced Practice Midwife

## 2018-12-16 DIAGNOSIS — N939 Abnormal uterine and vaginal bleeding, unspecified: Secondary | ICD-10-CM

## 2018-12-16 NOTE — Telephone Encounter (Signed)
The patient called in stating she has questions about her cycle. She stated the blood is a dark brown color as if its old blood. She would like to speak to someone in regards to the issue.

## 2018-12-19 NOTE — Telephone Encounter (Signed)
Returned pt's call regarding the color of her menstrual blood.  Pt states she does not have any issue that she wants addressed at this time, but she wanted to make sure that she had an appointment for annual exam and that it was noted to discuss her concerns.  Pt states she discussed with Dr. Elly Modena years ago about having a hysterectomy and she originally declined but she has not decided that she wants to have the procedure.  Pt has appointment for 01/29/19.  Pt verbalized understanding and expressed thanks.

## 2019-01-03 ENCOUNTER — Encounter: Payer: Self-pay | Admitting: Family Medicine

## 2019-01-03 ENCOUNTER — Ambulatory Visit
Admission: RE | Admit: 2019-01-03 | Discharge: 2019-01-03 | Disposition: A | Discharge status: Home / Self Care | Payer: BC Managed Care – PPO | Source: Ambulatory Visit | Attending: Family Medicine | Admitting: Family Medicine

## 2019-01-03 DIAGNOSIS — Z1231 Encounter for screening mammogram for malignant neoplasm of breast: Secondary | ICD-10-CM

## 2019-01-29 ENCOUNTER — Ambulatory Visit: Payer: BC Managed Care – PPO | Admitting: Obstetrics and Gynecology

## 2019-01-29 ENCOUNTER — Encounter: Payer: Self-pay | Admitting: Obstetrics and Gynecology

## 2019-01-29 ENCOUNTER — Other Ambulatory Visit: Payer: Self-pay

## 2019-01-29 VITALS — BP 144/88 | HR 81 | Wt 264.7 lb

## 2019-01-29 DIAGNOSIS — Z124 Encounter for screening for malignant neoplasm of cervix: Secondary | ICD-10-CM

## 2019-01-29 DIAGNOSIS — R102 Pelvic and perineal pain: Secondary | ICD-10-CM

## 2019-01-29 DIAGNOSIS — Z01419 Encounter for gynecological examination (general) (routine) without abnormal findings: Secondary | ICD-10-CM

## 2019-01-29 DIAGNOSIS — Z1151 Encounter for screening for human papillomavirus (HPV): Secondary | ICD-10-CM | POA: Diagnosis not present

## 2019-01-29 NOTE — Progress Notes (Signed)
[place] scheduled for August 19th @ 1300.  Pt notified.

## 2019-01-29 NOTE — Progress Notes (Signed)
Subjective:     Jodi Saunders is a 48 y.o. female P1 with BMI 45 LMP 12/17/18 who is here for a comprehensive physical exam. The patient reports dysmenorrha. Patient reports a monthly period of 3-4 days associated with severe dysmenorrhea despite taking NSAID 2 days prior to onset of her periods. Patient has failed medical management with contraception including IUD and surgical management with endometrial ablation. Patient is interested in hysterectomy.  Past Medical History:  Diagnosis Date  . Abnormal uterine bleeding 04/21/2013  . Allergy   . Elevated blood pressure reading    previously on medications, now diet controlled (lost 30 pounds)   . Multiple allergies    Past Surgical History:  Procedure Laterality Date  . BREAST BIOPSY Left 08/2016  . CHOLECYSTECTOMY  2011  . HYSTEROSCOPY WITH NOVASURE  06/03/2012   Procedure: HYSTEROSCOPY WITH NOVASURE;  Surgeon: [name], MD;  Location: [facility];  Service: Gynecology;  Laterality: N/A;  . LAPAROSCOPIC APPENDECTOMY N/A 12/14/2015   Procedure: APPENDECTOMY LAPAROSCOPIC CONVERTED TO OPEN;  Surgeon: Autumn Messing III, MD;  Location: [facility];  Service: General;  Laterality: N/A;  . MULTIPLE TOOTH EXTRACTIONS    . [place]  2002  . TONSILLECTOMY  2008  . VAGINAL DELIVERY  1992  . WISDOM TOOTH EXTRACTION     Family History  Problem Relation Age of Onset  . Diabetes Father   . Hypertension Father   . Hypertension Mother   . Renal cancer Brother   . Diabetes Paternal Grandmother   . Hypertension Paternal Grandmother   . Diabetes Paternal Grandfather   . Hypertension Paternal Grandfather   . Allergic rhinitis Neg Hx   . Angioedema Neg Hx   . Asthma Neg Hx   . Atopy Neg Hx   . Eczema Neg Hx   . Immunodeficiency Neg Hx   . Urticaria Neg Hx     Social History   Socioeconomic History  . Marital status: Single    Spouse name: Not on file  . Number of children: Not on file  . Years of education: Not on file  .  Highest education level: Not on file  Occupational History  . Not on file  Social Needs  . Financial resource strain: Not on file  . Food insecurity    Worry: Not on file    Inability: Not on file  . Transportation needs    Medical: Not on file    Non-medical: Not on file  Tobacco Use  . Smoking status: Never Smoker  . Smokeless tobacco: Never Used  Substance and Sexual Activity  . Alcohol use: Yes    Comment: occasionally  . Drug use: No  . Sexual activity: Yes    Birth control/protection: Pill  Lifestyle  . Physical activity    Days per week: Not on file    Minutes per session: Not on file  . Stress: Not on file  Relationships  . Social Herbalist on phone: Not on file    Gets together: Not on file    Attends religious service: Not on file    Active member of club or organization: Not on file    Attends meetings of clubs or organizations: Not on file    Relationship status: Not on file  . Intimate partner violence    Fear of current or ex partner: Not on file    Emotionally abused: Not on file    Physically abused: Not on file  Forced sexual activity: Not on file  Other Topics Concern  . Not on file  Social History Narrative  . Not on file   Health Maintenance  Topic Date Due  . INFLUENZA VACCINE  01/18/2019  . PAP SMEAR-Modifier  09/19/2020  . COLONOSCOPY  11/28/2023  . TETANUS/TDAP  09/23/2026  . HIV Screening  Completed      Review of Systems Pertinent items are noted in HPI.   Objective:  Blood pressure (!) 144/88, pulse 81, weight 264 lb 11.2 oz (120.1 kg), last menstrual period 12/11/2018.     GENERAL: Well-developed, well-nourished female in no acute distress.  HEENT: Normocephalic, atraumatic. Sclerae anicteric.  NECK: Supple. Normal thyroid.  LUNGS: Clear to auscultation bilaterally.  HEART: Regular rate and rhythm. BREASTS: Symmetric in size. No palpable masses or lymphadenopathy, skin changes, or nipple drainage. ABDOMEN: Soft,  nontender, nondistended. No organomegaly. PELVIC: Normal external female genitalia. Vagina is pink and rugated.  Normal discharge. Normal appearing cervix. Uterus is normal in size. No adnexal mass or tenderness. EXTREMITIES: No cyanosis, clubbing, or edema, 2+ distal pulses.    Assessment:    Healthy female exam.      Plan:    pap smear collected Patient with normal mammogram 12/2018 Pelvic ultrasound ordered in preparation for hysterectomy and to assess possible etiology of pain Patient is interested in robotic assisted vaginal hysterectomy. Will have her meet with Dr. Ihor Dow to determine if patient is a good candidate See After Visit Summary for Counseling Recommendations

## 2019-01-30 LAB — CYTOLOGY - PAP
Adequacy: ABSENT
Diagnosis: NEGATIVE
HPV: NOT DETECTED

## 2019-02-05 ENCOUNTER — Other Ambulatory Visit: Payer: Self-pay

## 2019-02-05 ENCOUNTER — Ambulatory Visit (HOSPITAL_COMMUNITY)
Admission: RE | Admit: 2019-02-05 | Discharge: 2019-02-05 | Disposition: A | Discharge status: Home / Self Care | Payer: BC Managed Care – PPO | Source: Ambulatory Visit | Attending: Obstetrics and Gynecology | Admitting: Obstetrics and Gynecology

## 2019-02-05 DIAGNOSIS — R102 Pelvic and perineal pain: Secondary | ICD-10-CM | POA: Diagnosis present

## 2019-02-12 ENCOUNTER — Telehealth: Payer: Self-pay | Admitting: Obstetrics & Gynecology

## 2019-02-12 NOTE — Telephone Encounter (Signed)
Attempted to contact patient about her appointment on 8/27 @ 8:50 Patient instructed that the appointment is a mychart visit. Patient instructed to download the mychart app if not already done so. Patient instructed to give the office a call with any concerns.

## 2019-02-13 ENCOUNTER — Other Ambulatory Visit: Payer: Self-pay

## 2019-02-13 ENCOUNTER — Encounter: Payer: Self-pay | Admitting: Obstetrics & Gynecology

## 2019-02-13 ENCOUNTER — Encounter: Payer: BC Managed Care – PPO | Admitting: Obstetrics & Gynecology

## 2019-02-13 NOTE — Progress Notes (Signed)
Pt not seen today

## 2019-02-13 NOTE — Progress Notes (Signed)
I connected with  Iliany L Uitenham on 02/13/19 at  8:50 AM EDT by telephone and verified that I am speaking with the correct person using two identifiers.   I discussed the limitations, risks, security and privacy concerns of performing an evaluation and management service by telephone and the availability of in person appointments. I also discussed with the patient that there may be a patient responsible charge related to this service. The patient expressed understanding and agreed to proceed.  Derinda Late, RN 02/13/2019  8:32 AM

## 2019-02-17 ENCOUNTER — Other Ambulatory Visit: Payer: Self-pay

## 2019-02-17 ENCOUNTER — Encounter: Payer: Self-pay | Admitting: Obstetrics & Gynecology

## 2019-02-17 ENCOUNTER — Ambulatory Visit (INDEPENDENT_AMBULATORY_CARE_PROVIDER_SITE_OTHER): Payer: BC Managed Care – PPO | Admitting: Obstetrics & Gynecology

## 2019-02-17 VITALS — BP 144/85 | HR 79 | Ht 64.0 in | Wt 268.1 lb

## 2019-02-17 DIAGNOSIS — N946 Dysmenorrhea, unspecified: Secondary | ICD-10-CM

## 2019-02-17 DIAGNOSIS — Z6841 Body Mass Index (BMI) 40.0 and over, adult: Secondary | ICD-10-CM | POA: Diagnosis not present

## 2019-02-17 NOTE — Progress Notes (Signed)
History:  48 y.o. [id number] here today for surgical consult. Pt is s/p extensive eval with Dr. Elly Modena for pelvic pain and AUB. Sx of pain persist despite conservative measures. After consultation with Dr. Elly Modena she is interested in a [place] and is here for eval. She denies new sx      The following portions of the patient's history were reviewed and updated as appropriate: allergies, current medications, past family history, past medical history, past social history, past surgical history and problem list.  Review of Systems:  Pt is s/p APPENDECTOMY LAPAROSCOPIC CONVERTED TO OPEN Ileocecectomy ib 12/14/2015 by Dr. Marlou Starks. And lap choley. She also had a endometrial ablation with Dr. Elly Modena 06/03/2012     Objective:  Physical Exam Blood pressure (!) 144/85, pulse 79, height 5\' 4"  (1.626 m), weight 268 lb 1.3 oz (121.6 kg), last menstrual period 02/06/2019.  CONSTITUTIONAL: Well-developed, well-nourished female in no acute distress.  HENT:  Normocephalic, atraumatic EYES: Conjunctivae and EOM are normal. No scleral icterus.  NECK: Normal range of motion SKIN: Skin is warm and dry. No rash noted. Not diaphoretic.No pallor. [place]: Alert and oriented to person, place, and time. Normal coordination.   Abd: Soft, nontender and nondistended; midline incision noted. ON masses palpated. .  Pelvic: Normal appearing external genitalia; normal appearing vaginal mucosa and cervix.  Normal discharge.  Small uterus, no other palpable masses, no uterine or adnexal tenderness  Labs and Imaging US Pelvic Complete With Transvaginal  Result Date: 02/05/2019 CLINICAL DATA:  Pelvic pain, dysmenorrhea EXAM: TRANSABDOMINAL AND TRANSVAGINAL ULTRASOUND OF PELVIS TECHNIQUE: Both transabdominal and transvaginal ultrasound examinations of the pelvis were performed. Transabdominal technique was performed for global imaging of the pelvis including uterus, ovaries, adnexal regions, and pelvic cul-de-sac. It was necessary  to proceed with endovaginal exam following the transabdominal exam to visualize the endometrium and ovaries. COMPARISON:  10/13/2011 FINDINGS: Uterus Measurements: 9.3 x 7.3 x 8.0 cm = volume: 283 mL. Mildly heterogeneous echogenicity of myometrium. Several focal nodules are identified consistent with probable leiomyomata. These include an anterior LEFT side subserosal mass 3.3 x 3.0 x 3.1 cm, and anterior upper uterine segment intramural mass 3.7 x 3.6 x 3.6 cm, and a posterior subserosal mass slightly to the RIGHT 3.4 x 2.8 x 2.3 cm. Endometrium Thickness: 5 mm. Suboptimally visualized, likely due to distortion of uterus by multiple leiomyomata. No endometrial fluid or definite focal abnormality Right ovary Measurements: 2.4 x 1.8 x 1.7 cm = volume: 3.9 mL. Normal morphology without mass Left ovary Measurements: 3.3 x 1.7 x 2.2 cm = volume: 6.3 mL. Normal morphology without mass Other findings No free pelvic fluid or adnexal masses. IMPRESSION: Multiple probable uterine leiomyomata. Otherwise negative exam. Electronically Signed   By: Lavonia Dana M.D.   On: 02/05/2019 14:01    Assessment & Plan:  Pelvic pain and dysmenorrhea in pt with h/o AUB and endometrial ablation. Pt requests definitve tx with [place] with bilateral salpingectomy. Pt with a extensive surgical history. I have review her op notes from Dr. Marlou Starks and [facility].   Patient desires surgical management with [place] with bilateral salpingectomy.  The risks of surgery were discussed in detail with the patient including but not limited to: bleeding which may require transfusion or reoperation; infection which may require prolonged hospitalization or re-hospitalization and antibiotic therapy; injury to bowel (I have explained to her that her risk is increased in the face of prior bowel surgery) bladder, ureters and major vessels or other surrounding organs; need for additional procedures including  laparotomy; thromboembolic phenomenon, incisional problems  and other postoperative or anesthesia complications.  Patient was told that the likelihood that her condition and symptoms will be treated effectively with this surgical management was very high; the postoperative expectations were also discussed in detail. The patient also understands the alternative treatment options which were discussed in full. All questions were answered.  She was told that she will be contacted by our surgical scheduler regarding the time and date of her surgery; routine preoperative instructions of having nothing to eat or drink after midnight on the day prior to surgery and also coming to the hospital 1 1/2 hours prior to her time of surgery were also emphasized.  She was told she may be called for a preoperative appointment about a week prior to surgery and will be given further preoperative instructions at that visit. Printed patient education handouts about the procedure were given to the patient to review at home.  Total face-to-face time with patient was 25 min.  Greater than 50% was spent in counseling and coordination of care with the patient.   Jose Alleyne L. Harraway-Smith, M.D., [facility]lynn June

## 2019-02-17 NOTE — Patient Instructions (Signed)
Total Laparoscopic Hysterectomy °A total laparoscopic hysterectomy is a minimally invasive surgery to remove the uterus and cervix. The fallopian tubes and ovaries can also be removed (bilateral salpingo-oophorectomy) during this surgery, if necessary. This procedure may be done to treat problems such as: °· Noncancerous growths in the uterus (uterine fibroids) that cause symptoms. °· A condition that causes the lining of the uterus (endometrium) to grow in other areas (endometriosis). °· Problems with pelvic support. This is caused by weakened muscles of the pelvis following vaginal childbirth or menopause. °· Cancer of the cervix, ovaries, uterus, or endometrium. °· Excessive (dysfunctional) uterine bleeding. °This surgery is performed by inserting a thin, lighted tube (laparoscope) and surgical instruments into small incisions in the abdomen. The laparoscope sends images to a monitor. The images help the health care provider perform the procedure. After this procedure, you will no longer be able to have a baby, and you will no longer have a menstrual period. °Tell a health care provider about: °· Any allergies you have. °· All medicines you are taking, including vitamins, herbs, eye drops, creams, and over-the-counter medicines. °· Any problems you or family members have had with anesthetic medicines. °· Any blood disorders you have. °· Any surgeries you have had. °· Any medical conditions you have. °· Whether you are pregnant or may be pregnant. °What are the risks? °Generally, this is a safe procedure. However, problems may occur, including: °· Infection. °· Bleeding. °· Blood clots in the legs or lungs. °· Allergic reactions to medicines. °· Damage to other structures or organs. °· The risk that the surgery may have to be switched to the regular one in which a large incision is made in the abdomen (abdominal hysterectomy). °What happens before the procedure? °Staying hydrated °Follow instructions from your  health care provider about hydration, which may include: °· Up to 2 hours before the procedure - you may continue to drink clear liquids, such as water, clear fruit juice, black coffee, and plain tea °Eating and drinking restrictions °Follow instructions from your health care provider about eating and drinking, which may include: °· 8 hours before the procedure - stop eating heavy meals or foods such as meat, fried foods, or fatty foods. °· 6 hours before the procedure - stop eating light meals or foods, such as toast or cereal. °· 6 hours before the procedure - stop drinking milk or drinks that contain milk. °· 2 hours before the procedure - stop drinking clear liquids. °Medicines °· Ask your health care provider about: °? Changing or stopping your regular medicines. This is especially important if you are taking diabetes medicines or blood thinners. °? Taking over-the-counter medicines, vitamins, herbs, and supplements. °? Taking medicines such as aspirin and ibuprofen. These medicines can thin your blood. Do not take these medicines unless your health care provider tells you to take them. °· You may be given antibiotic medicine to help prevent infection. °· You may be asked to take laxatives. °· You may be given medicines to help prevent nausea and vomiting after the procedure. °General instructions °· Ask your health care provider how your surgical site will be marked or identified. °· You may be asked to shower with a germ-killing soap. °· Do not use any products that contain nicotine or tobacco, such as cigarettes and e-cigarettes. If you need help quitting, ask your health care provider. °· You may have an exam or testing, such as an ultrasound to determine the size and shape of your pelvic organs. °·   You may have a blood or urine sample taken. °· This procedure can affect the way you feel about yourself. Talk with your health care provider about the physical and emotional changes hysterectomy may  cause. °· Plan to have someone take you home from the hospital or clinic. °· Plan to have a responsible adult care for you for at least 24 hours after you leave the hospital or clinic. This is important. °What happens during the procedure? °· To lower your risk of infection: °? Your health care team will wash or sanitize their hands. °? Your skin will be washed with soap. °? Hair may be removed from the surgical area. °· An IV will be inserted into one of your veins. °· You will be given one or more of the following: °? A medicine to help you relax (sedative). °? A medicine to make you fall asleep (general anesthetic). °· You will be given antibiotic medicine through your IV. °· A tube may be inserted down your throat to help you breathe during the procedure. °· A gas (carbon dioxide) will be used to inflate your abdomen to allow your surgeon to see inside of your abdomen. °· Three or four small incisions will be made in your abdomen. °· A laparoscope will be inserted into one of your incisions. Surgical instruments will be inserted through the other incisions in order to perform the procedure. °· Your uterus and cervix may be removed through your vagina or cut into small pieces and removed through the small incisions. Any other organs that need to be removed will also be removed this way. °· Carbon dioxide will be released from inside of your abdomen. °· Your incisions will be closed with stitches (sutures). °· A bandage (dressing) may be placed over your incisions. °The procedure may vary among health care providers and hospitals. °What happens after the procedure? °· Your blood pressure, heart rate, breathing rate, and blood oxygen level will be monitored until the medicines you were given have worn off. °· You will be given medicine for pain and nausea as needed. °· Do not drive for 24 hours if you received a sedative. °Summary °· Total Laparoscopic hysterectomy is a procedure to remove your uterus, cervix and  sometimes the fallopian tubes and ovaries. °· This procedure can affect the way you feel about yourself. Talk with your health care provider about the physical and emotional changes hysterectomy may cause. °· After this procedure, you will no longer be able to have a baby, and you will no longer have a menstrual period. °· You will be given pain medicine to control discomfort after this procedure. °This information is not intended to replace advice given to you by your health care provider. Make sure you discuss any questions you have with your health care provider. °Document Released: 04/02/2007 Document Revised: 05/18/2017 Document Reviewed: 08/16/2016 °Elsevier Patient Education © 2020 [employer] °Total Laparoscopic Hysterectomy, Care After °This sheet gives you information about how to care for yourself after your procedure. Your health care provider may also give you more specific instructions. If you have problems or questions, contact your health care provider. °What can I expect after the procedure? °After the procedure, it is common to have: °· Pain and bruising around your incisions. °· A sore throat, if a breathing tube was used during surgery. °· Fatigue. °· Poor appetite. °· Less interest in sex. °If your ovaries were also removed, it is also common to have symptoms of menopause such as hot   flashes, night sweats, and lack of sleep (insomnia). °Follow these instructions at home: °Bathing °· Do not take baths, swim, or use a hot tub until your health care provider approves. You may need to only take showers for 2-3 weeks. °· Keep your bandage (dressing) dry until your health care provider says it can be removed. °Incision care ° °· Follow instructions from your health care provider about how to take care of your incisions. Make sure you: °? Wash your hands with soap and water before you change your dressing. If soap and water are not available, use hand sanitizer. °? Change your dressing as told by  your health care provider. °? Leave stitches (sutures), skin glue, or adhesive strips in place. These skin closures may need to stay in place for 2 weeks or longer. If adhesive strip edges start to loosen and curl up, you may trim the loose edges. Do not remove adhesive strips completely unless your health care provider tells you to do that. °· Check your incision area every day for signs of infection. Check for: °? Redness, swelling, or pain. °? Fluid or blood. °? Warmth. °? Pus or a bad smell. °Activity °· Get plenty of rest and sleep. °· Do not lift anything that is heavier than 10 lbs (4.5 kg) for one month after surgery, or as long as told by your health care provider. °· Do not drive or use heavy machinery while taking prescription pain medicine. °· Do not drive for 24 hours if you were given a medicine to help you relax (sedative). °· Return to your normal activities as told by your health care provider. Ask your health care provider what activities are safe for you. °Lifestyle ° °· Do not use any products that contain nicotine or tobacco, such as cigarettes and e-cigarettes. These can delay healing. If you need help quitting, ask your health care provider. °· Do not drink alcohol until your health care provider approves. °General instructions °· Do not douche, use tampons, or have sex for at least 6 weeks, or as told by your health care provider. °· Take over-the-counter and prescription medicines only as told by your health care provider. °· To monitor yourself for a fever, take your temperature at least once a day during recovery. °· If you struggle with physical or emotional changes after your procedure, speak with your health care provider or a therapist. °· To prevent or treat constipation while you are taking prescription pain medicine, your health care provider may recommend that you: °? Drink enough fluid to keep your urine clear or pale yellow. °? Take over-the-counter or prescription  medicines. °? Eat foods that are high in fiber, such as fresh fruits and vegetables, whole grains, and beans. °? Limit foods that are high in fat and processed sugars, such as fried and sweet foods. °· Keep all follow-up visits as told by your health care provider. This is important. °Contact a health care provider if: °· You have chills or a fever. °· You have redness, swelling, or pain around an incision. °· You have fluid or blood coming from an incision. °· Your incision feels warm to the touch. °· You have pus or a bad smell coming from an incision. °· An incision breaks open. °· You feel dizzy or light-headed. °· You have pain or bleeding when you urinate. °· You have diarrhea, nausea, or vomiting that does not go away. °· You have abnormal vaginal discharge. °· You have a rash. °· You have   pain that does not get better with medicine. °Get help right away if: °· You have a fever and your symptoms suddenly get worse. °· You have severe abdominal pain. °· You have chest pain. °· You have shortness of breath. °· You faint. °· You have pain, swelling, or redness on your leg. °· You have heavy vaginal bleeding with blood clots. °Summary °· After the procedure it is common to have abdominal pain. Your provider will give you medication for this. °· Do not take baths, swim, or use a hot tub until your health care provider approves. °· Do not lift anything that is heavier than 10 lbs (4.5 kg) for one month after surgery, or as long as told by your health care provider. °· Notify your provider if you have any signs or symptoms of infection after the procedure. °This information is not intended to replace advice given to you by your health care provider. Make sure you discuss any questions you have with your health care provider. °Document Released: 03/26/2013 Document Revised: 05/18/2017 Document Reviewed: 08/16/2016 °Elsevier Patient Education © 2020 [employer] ° °

## 2019-05-02 ENCOUNTER — Other Ambulatory Visit: Payer: Self-pay

## 2019-05-02 ENCOUNTER — Encounter: Payer: Self-pay | Admitting: Obstetrics & Gynecology

## 2019-05-02 ENCOUNTER — Ambulatory Visit (INDEPENDENT_AMBULATORY_CARE_PROVIDER_SITE_OTHER): Payer: BC Managed Care – PPO | Admitting: Obstetrics & Gynecology

## 2019-05-02 DIAGNOSIS — R102 Pelvic and perineal pain: Secondary | ICD-10-CM | POA: Diagnosis not present

## 2019-05-02 DIAGNOSIS — Z01818 Encounter for other preprocedural examination: Secondary | ICD-10-CM

## 2019-05-02 DIAGNOSIS — N920 Excessive and frequent menstruation with regular cycle: Secondary | ICD-10-CM | POA: Diagnosis not present

## 2019-05-02 NOTE — Progress Notes (Signed)
Patient agrees to [employer]. Patient LMP 04-24-09-2020. Patient experiencing cramping now. Kathrene Alu RN

## 2019-05-02 NOTE — Progress Notes (Signed)
TELEHEALTH GYNECOLOGY VIRTUAL VIDEO VISIT ENCOUNTER NOTE  Provider location: [facility] at [facility]   I connected with [name] on 05/02/19 at  9:30 AM EST by WebEx Video Encounter at home and verified that I am speaking with the correct person using two identifiers.   I discussed the limitations, risks, security and privacy concerns of performing an evaluation and management service virtually and the availability of in person appointments. I also discussed with the patient that there may be a patient responsible charge related to this service. The patient expressed understanding and agreed to proceed.   History:  Jodi Saunders is a 48 y.o. [id number] female being evaluated today for preop for [place] with bilateral salpingectomy for menorrhagia and pelvic pain.  Pt denies any bleeding between menses but, she does reports pain that is becoming increasingly worse with menses and prior to her menses. Pt is s/p endometrial ablation. The has suspected adenomyosis. She denies any abnormal vaginal discharge, bleeding, pelvic pain or other concerns.    H/o prev lap cholecystectomy and  ileocecectomy after a adherent appendectomy. NO h/o ruptured appy.       Past Medical History:  Diagnosis Date   Abnormal uterine bleeding 04/21/2013   Allergy    Elevated blood pressure reading    previously on medications, now diet controlled (lost 30 pounds)    Multiple allergies    Past Surgical History:  Procedure Laterality Date   BREAST BIOPSY Left 08/2016   CHOLECYSTECTOMY  2011   HYSTEROSCOPY WITH NOVASURE  06/03/2012   Procedure: HYSTEROSCOPY WITH NOVASURE;  Surgeon: [name], MD;  Location: [facility];  Service: Gynecology;  Laterality: N/A;   LAPAROSCOPIC APPENDECTOMY N/A 12/14/2015   Procedure: APPENDECTOMY LAPAROSCOPIC CONVERTED TO OPEN;  Surgeon: Autumn Messing III, MD;  Location: [facility];  Service: General;  Laterality: N/A;   MULTIPLE TOOTH EXTRACTIONS       ORIF RADIUS & ULNA FRACTURES  2002   TONSILLECTOMY  2008   VAGINAL DELIVERY  1992   WISDOM TOOTH EXTRACTION     The following portions of the patient's history were reviewed and updated as appropriate: allergies, current medications, past family history, past medical history, past social history, past surgical history and problem list.   Health Maintenance:  Normal pap and negative HRHPV on 01/29/2019.  Normal mammogram on 01/03/2019.   Review of Systems:  Pertinent items noted in HPI and remainder of comprehensive ROS otherwise negative.  Physical Exam:   General:  Alert, oriented and cooperative. Patient appears to be in no acute distress.  Mental Status: Normal mood and affect. Normal behavior. Normal judgment and thought content.   Respiratory: Normal respiratory effort, no problems with respiration noted  Rest of physical exam deferred due to type of encounter  Labs and Imaging  02/05/2019 CLINICAL DATA:  Pelvic pain, dysmenorrhea  EXAM: TRANSABDOMINAL AND TRANSVAGINAL ULTRASOUND OF PELVIS  TECHNIQUE: Both transabdominal and transvaginal ultrasound examinations of the pelvis were performed. Transabdominal technique was performed for global imaging of the pelvis including uterus, ovaries, adnexal regions, and pelvic cul-de-sac. It was necessary to proceed with endovaginal exam following the transabdominal exam to visualize the endometrium and ovaries.  COMPARISON:  10/13/2011  FINDINGS: Uterus  Measurements: 9.3 x 7.3 x 8.0 cm = volume: 283 mL. Mildly heterogeneous echogenicity of myometrium. Several focal nodules are identified consistent with probable leiomyomata. These include an anterior LEFT side subserosal mass 3.3 x 3.0 x 3.1 cm, and anterior upper uterine segment  intramural mass 3.7 x 3.6 x 3.6 cm, and a posterior subserosal mass slightly to the RIGHT 3.4 x 2.8 x 2.3 cm.  Endometrium  Thickness: 5 mm. Suboptimally visualized, likely due to  distortion of uterus by multiple leiomyomata. No endometrial fluid or definite focal abnormality  Right ovary  Measurements: 2.4 x 1.8 x 1.7 cm = volume: 3.9 mL. Normal morphology without mass  Left ovary  Measurements: 3.3 x 1.7 x 2.2 cm = volume: 6.3 mL. Normal morphology without mass  Other findings  No free pelvic fluid or adnexal masses.  IMPRESSION: Multiple probable uterine leiomyomata.  Otherwise negative exam.  Assessment and Plan:     Patient desires surgical management with [place] with bilateral salpingectomy.     The risks of surgery were discussed in detail with the patient including but not limited to: bleeding which may require transfusion or reoperation; infection which may require prolonged hospitalization or re-hospitalization and antibiotic therapy; injury to bowel, bladder, ureters and major vessels or other surrounding organs; need for additional procedures including laparotomy; thromboembolic phenomenon, incisional problems and other postoperative or anesthesia complications.  Patient was told that the likelihood that her condition and symptoms will be treated effectively with this surgical management was very high; the postoperative expectations were also discussed in detail. I have explained to pt that if there is significant adhesions, we will proceed to open procedure. The patient also understands the alternative treatment options which were discussed in full. All questions were answered.  She was told that she will be contacted by our surgical scheduler regarding the time and date of her surgery; routine preoperative instructions of having nothing to eat or drink after midnight on the day prior to surgery and also coming to the hospital 1 1/2 hours prior to her time of surgery were also emphasized.  She was told she may be called for a preoperative appointment about a week prior to surgery and will be given further preoperative instructions at that visit.  Printed patient education handouts about the procedure were given to the patient to review at home.      I discussed the assessment and treatment plan with the patient. The patient was provided an opportunity to ask questions and all were answered. The patient agreed with the plan and demonstrated an understanding of the instructions.   The patient was advised to call back or seek an in-person evaluation/go to the ED if the symptoms worsen or if the condition fails to improve as anticipated.  I provided 17 minutes of face-to-face time during this encounter.   Lavonia Drafts, MD [facility], [place]

## 2019-05-06 DIAGNOSIS — R102 Pelvic and perineal pain: Secondary | ICD-10-CM

## 2019-05-07 NOTE — Patient Instructions (Addendum)
YOU ARE SCHEDULED FOR A COVID TEST _________@____________ . THIS TEST MUST BE DONE BEFORE SURGERY. GO TO  [address], [place], [address] AND REMAIN IN YOUR CAR, THIS IS A DRIVE UP TEST. ONCE YOUR COVID TEST IS DONE PLEASE FOLLOW ALL THE QUARANTINE  INSTRUCTIONS GIVEN IN YOUR HANDOUT.      Your procedure is scheduled on 05-13-19   Report to [place]. M.   Call this number if you have problems the morning of surgery  :[phone].   OUR ADDRESS IS [place].  WE ARE LOCATED IN [facility].                                     REMEMBER:  DO NOT EAT FOOD OR DRINK LIQUIDS AFTER MIDNIGHT .   YOU MAY  BRUSH YOUR TEETH MORNING OF SURGERY AND RINSE YOUR MOUTH OUT, NO CHEWING GUM CANDY OR MINTS.   TAKE THESE MEDICATIONS MORNING OF SURGERY WITH A SIP OF WATER:  _zyrtec_________________________________  IF YOU ARE SPENDING THE NIGHT AFTER SURGERY PLEASE BRING ALL YOUR PRESCRIPTION MEDICATIONS IN THEIR ORIGINAL BOTTLES. 1 VISITOR IS ALLOWED IN WAITING ROOM ONLY DAY OF SURGERY. NO VISITOR MAY SPEND THE NIGHT. VISITOR ARE ALLOWED TO STAY UNTIL 800 PM.                                    DO NOT WEAR JEWERLY, MAKE UP, OR NAIL POLISH ON FINGERNAILS. DO NOT WEAR LOTIONS, POWDERS, PERFUMES OR DEODORANT. DO NOT SHAVE FOR 24 HOURS PRIOR TO DAY OF SURGERY.  CONTACTS, GLASSES, OR DENTURES MAY NOT BE WORN TO SURGERY.                                    [facility] IS NOT RESPONSIBLE  FOR ANY BELONGINGS.                                                                    .                                                                                                             _____________________________________________________________________             [facility] - Preparing for Surgery Before surgery, you can play an important role.  Because skin is not sterile, your skin needs to be as free of germs as possible.  You can  reduce the number of germs on your skin by washing  with CHG (chlorahexidine gluconate) soap before surgery.  CHG is an antiseptic cleaner which kills germs and bonds with the skin to continue killing germs even after washing. Please DO NOT use if you have an allergy to CHG or antibacterial soaps.  If your skin becomes reddened/irritated stop using the CHG and inform your nurse when you arrive at Short Stay. Do not shave (including legs and underarms) for at least 48 hours prior to the first CHG shower.  You may shave your face/neck. Please follow these instructions carefully:  1.  Shower with CHG Soap the night before surgery and the  morning of Surgery.  2.  If you choose to wash your hair, wash your hair first as usual with your  normal  shampoo.  3.  After you shampoo, rinse your hair and body thoroughly to remove the  shampoo.                           4.  Use CHG as you would any other liquid soap.  You can apply chg directly  to the skin and wash                       Gently with a scrungie or clean washcloth.  5.  Apply the CHG Soap to your body ONLY FROM THE NECK DOWN.   Do not use on face/ open                           Wound or open sores. Avoid contact with eyes, ears mouth and genitals (private parts).                       Wash face,  Genitals (private parts) with your normal soap.             6.  Wash thoroughly, paying special attention to the area where your surgery  will be performed.  7.  Thoroughly rinse your body with warm water from the neck down.  8.  DO NOT shower/wash with your normal soap after using and rinsing off  the CHG Soap.                9.  Pat yourself dry with a clean towel.            10.  Wear clean pajamas.            11.  Place clean sheets on your bed the night of your first shower and do not  sleep with pets. Day of Surgery : Do not apply any lotions/deodorants the morning of surgery.  Please wear clean clothes to the hospital/surgery center.  FAILURE TO  FOLLOW THESE INSTRUCTIONS MAY RESULT IN THE CANCELLATION OF YOUR SURGERY PATIENT SIGNATURE_________________________________  NURSE SIGNATURE__________________________________  ________________________________________________________________________  WHAT IS A BLOOD TRANSFUSION? Blood Transfusion Information  A transfusion is the replacement of blood or some of its parts. Blood is made up of multiple cells which provide different functions.  Red blood cells carry oxygen and are used for blood loss replacement.  White blood cells fight against infection.  Platelets control bleeding.  Plasma helps clot blood.  Other blood products are available for specialized needs, such as hemophilia or other clotting disorders. BEFORE THE TRANSFUSION  Who gives blood for transfusions?   Healthy volunteers who are fully evaluated to make sure their blood is safe.  This is blood bank blood. Transfusion therapy is the safest it has ever been in the practice of medicine. Before blood is taken from a donor, a complete history is taken to make sure that person has no history of diseases nor engages in risky social behavior (examples are intravenous drug use or sexual activity with multiple partners). The donor's travel history is screened to minimize risk of transmitting infections, such as malaria. The donated blood is tested for signs of infectious diseases, such as HIV and hepatitis. The blood is then tested to be sure it is compatible with you in order to minimize the chance of a transfusion reaction. If you or a relative donates blood, this is often done in anticipation of surgery and is not appropriate for emergency situations. It takes many days to process the donated blood. RISKS AND COMPLICATIONS Although transfusion therapy is very safe and saves many lives, the main dangers of transfusion include:   Getting an infectious disease.  Developing a transfusion reaction. This is an allergic reaction to  something in the blood you were given. Every precaution is taken to prevent this. The decision to have a blood transfusion has been considered carefully by your caregiver before blood is given. Blood is not given unless the benefits outweigh the risks. AFTER THE TRANSFUSION  Right after receiving a blood transfusion, you will usually feel much better and more energetic. This is especially true if your red blood cells have gotten low (anemic). The transfusion raises the level of the red blood cells which carry oxygen, and this usually causes an energy increase.  The nurse administering the transfusion will monitor you carefully for complications. HOME CARE INSTRUCTIONS  No special instructions are needed after a transfusion. You may find your energy is better. Speak with your caregiver about any limitations on activity for underlying diseases you may have. SEEK MEDICAL CARE IF:   Your condition is not improving after your transfusion.  You develop redness or irritation at the intravenous (IV) site. SEEK IMMEDIATE MEDICAL CARE IF:  Any of the following symptoms occur over the next 12 hours:  Shaking chills.  You have a temperature by mouth above 102 F (38.9 C), not controlled by medicine.  Chest, back, or muscle pain.  People around you feel you are not acting correctly or are confused.  Shortness of breath or difficulty breathing.  Dizziness and fainting.  You get a rash or develop hives.  You have a decrease in urine output.  Your urine turns a dark color or changes to pink, red, or brown. Any of the following symptoms occur over the next 10 days:  You have a temperature by mouth above 102 F (38.9 C), not controlled by medicine.  Shortness of breath.  Weakness after normal activity.  The white part of the eye turns yellow (jaundice).  You have a decrease in the amount of urine or are urinating less often.  Your urine turns a dark color or changes to pink, red, or  brown. Document Released: 06/02/2000 Document Revised: 08/28/2011 Document Reviewed: 01/20/2008 [facility] Patient Information 2014 [place], [place].  _______________________________________________________________________

## 2019-05-09 ENCOUNTER — Other Ambulatory Visit (HOSPITAL_COMMUNITY)
Admission: RE | Admit: 2019-05-09 | Discharge: 2019-05-09 | Disposition: A | Discharge status: Home / Self Care | Payer: BC Managed Care – PPO | Source: Ambulatory Visit | Attending: Obstetrics & Gynecology | Admitting: Obstetrics & Gynecology

## 2019-05-09 DIAGNOSIS — Z01812 Encounter for preprocedural laboratory examination: Secondary | ICD-10-CM | POA: Insufficient documentation

## 2019-05-09 DIAGNOSIS — Z20828 Contact with and (suspected) exposure to other viral communicable diseases: Secondary | ICD-10-CM | POA: Insufficient documentation

## 2019-05-11 LAB — NOVEL CORONAVIRUS, NAA (HOSP ORDER, SEND-OUT TO REF LAB; TAT 18-24 HRS): SARS-CoV-2, NAA: NOT DETECTED

## 2019-05-12 ENCOUNTER — Encounter (HOSPITAL_COMMUNITY): Payer: Self-pay

## 2019-05-12 ENCOUNTER — Encounter (HOSPITAL_COMMUNITY)
Admission: RE | Admit: 2019-05-12 | Discharge: 2019-05-12 | Disposition: A | Discharge status: Home / Self Care | Payer: BC Managed Care – PPO | Source: Ambulatory Visit | Attending: Obstetrics & Gynecology | Admitting: Obstetrics & Gynecology

## 2019-05-12 ENCOUNTER — Other Ambulatory Visit: Payer: Self-pay

## 2019-05-12 DIAGNOSIS — R102 Pelvic and perineal pain: Secondary | ICD-10-CM | POA: Diagnosis not present

## 2019-05-12 DIAGNOSIS — D649 Anemia, unspecified: Secondary | ICD-10-CM | POA: Diagnosis not present

## 2019-05-12 DIAGNOSIS — N946 Dysmenorrhea, unspecified: Secondary | ICD-10-CM | POA: Diagnosis not present

## 2019-05-12 DIAGNOSIS — Z01818 Encounter for other preprocedural examination: Secondary | ICD-10-CM | POA: Insufficient documentation

## 2019-05-12 LAB — CBC
HCT: 34.3 % — ABNORMAL LOW (ref 36.0–46.0)
Hemoglobin: 10.5 g/dL — ABNORMAL LOW (ref 12.0–15.0)
MCH: 22 pg — ABNORMAL LOW (ref 26.0–34.0)
MCHC: 30.6 g/dL (ref 30.0–36.0)
MCV: 71.8 fL — ABNORMAL LOW (ref 80.0–100.0)
Platelets: 343 10*3/uL (ref 150–400)
RBC: 4.78 MIL/uL (ref 3.87–5.11)
RDW: 15.1 % (ref 11.5–15.5)
WBC: 7.4 10*3/uL (ref 4.0–10.5)
nRBC: 0 % (ref 0.0–0.2)

## 2019-05-12 LAB — BASIC METABOLIC PANEL
Anion gap: 9 (ref 5–15)
BUN: 10 mg/dL (ref 6–20)
CO2: 25 mmol/L (ref 22–32)
Calcium: 9.2 mg/dL (ref 8.9–10.3)
Chloride: 104 mmol/L (ref 98–111)
Creatinine, Ser: 0.64 mg/dL (ref 0.44–1.00)
GFR calc Af Amer: >60 mL/min (ref >60)
GFR calc non Af Amer: >60 mL/min (ref >60)
Glucose, Bld: 109 mg/dL — ABNORMAL HIGH (ref 70–99)
Potassium: 3.9 mmol/L (ref 3.5–5.1)
Sodium: 138 mmol/L (ref 135–145)

## 2019-05-12 LAB — ABO/RH: ABO/RH(D): O POS

## 2019-05-12 NOTE — Anesthesia Preprocedure Evaluation (Addendum)
Anesthesia Evaluation  Patient identified by MRN, date of birth, ID band Patient awake    Reviewed: Allergy & Precautions, NPO status , Patient's Chart, lab work & pertinent test results  Airway Mallampati: II  TM Distance: >3 FB Neck ROM: Full    Dental  (+) Dental Advisory Given, Teeth Intact   Pulmonary neg pulmonary ROS,    Pulmonary exam normal breath sounds clear to auscultation       Cardiovascular negative cardio ROS Normal cardiovascular exam Rhythm:Regular Rate:Normal     Neuro/Psych negative neurological ROS  negative psych ROS   GI/Hepatic negative GI ROS, Neg liver ROS,   Endo/Other  Morbid obesity  Renal/GU negative Renal ROS     Musculoskeletal negative musculoskeletal ROS (+)   Abdominal (+) + obese,   Peds  Hematology  (+) Blood dyscrasia, anemia ,   Anesthesia Other Findings   Reproductive/Obstetrics negative OB ROS                                                             Anesthesia Evaluation  Patient identified by MRN, date of birth, ID band Patient awake    Reviewed: Allergy & Precautions, NPO status , Patient's Chart, lab work & pertinent test results  Airway Mallampati: III  TM Distance: >3 FB Neck ROM: Full    Dental no notable dental hx. (+) Teeth Intact   Pulmonary neg pulmonary ROS, asthma ,    Pulmonary exam normal breath sounds clear to auscultation       Cardiovascular hypertension, Normal cardiovascular exam Rhythm:Regular Rate:Normal     Neuro/Psych Tibialis tendonitis Pes Cavus  Neuromuscular disease negative psych ROS   GI/Hepatic Neg liver ROS, Acute appendicitis   Endo/Other  Morbid obesity  Renal/GU negative Renal ROS  negative genitourinary   Musculoskeletal negative musculoskeletal ROS (+)   Abdominal (+) + obese,   Peds  Hematology  (+) anemia ,   Anesthesia Other Findings    Reproductive/Obstetrics negative OB ROS                             Anesthesia Physical Anesthesia Plan  ASA: III  Anesthesia Plan: General   Post-op Pain Management:    Induction: Intravenous, Rapid sequence and Cricoid pressure planned  Airway Management Planned: Oral ETT  Additional Equipment:   Intra-op Plan:   Post-operative Plan: Extubation in OR  Informed Consent: I have reviewed the patients History and Physical, chart, labs and discussed the procedure including the risks, benefits and alternatives for the proposed anesthesia with the patient or authorized representative who has indicated his/her understanding and acceptance.   Dental advisory given  Plan Discussed with: CRNA, Anesthesiologist and Surgeon  Anesthesia Plan Comments:         Anesthesia Quick Evaluation  Lab Results  Component Value Date   WBC 7.4 05/12/2019   HGB 10.5 (L) 05/12/2019   HCT 34.3 (L) 05/12/2019   MCV 71.8 (L) 05/12/2019   PLT 343 05/12/2019   Lab Results  Component Value Date   CREATININE 0.64 05/12/2019   BUN 10 05/12/2019   NA 138 05/12/2019   K 3.9 05/12/2019   CL 104 05/12/2019   CO2 25 05/12/2019    Anesthesia Physical Anesthesia Plan  ASA: III  Anesthesia Plan: General   Post-op Pain Management:    Induction: Intravenous  PONV Risk Score and Plan: 4 or greater and Ondansetron, Dexamethasone, Treatment may vary due to age or medical condition, Midazolam and Scopolamine patch - Pre-op  Airway Management Planned: Oral ETT  Additional Equipment:   Intra-op Plan:   Post-operative Plan: Extubation in OR  Informed Consent: I have reviewed the patients History and Physical, chart, labs and discussed the procedure including the risks, benefits and alternatives for the proposed anesthesia with the patient or authorized representative who has indicated his/her understanding and acceptance.     Dental advisory given  Plan Discussed  with: CRNA  Anesthesia Plan Comments:        Anesthesia Quick Evaluation

## 2019-05-12 NOTE — Progress Notes (Signed)
PCP - Dorris Singh  Cardiologist -   Chest x-ray -  EKG - done 05-12-19 epic Stress Test -  ECHO -  Cardiac Cath -   Sleep Study -  CPAP -   Fasting Blood Sugar -  Checks Blood Sugar _____ times a day  Blood Thinner Instructions: Aspirin Instructions: Last Dose:  Anesthesia review: BP elevated at preop no bp meds  Per pt. Normally  BP OK.  EKG done at preop pt. Instructed to avoid salt today and drink lots of water surgery tomorrow 05-13-19  Patient denies shortness of breath, fever, cough and chest pain at PAT appointment   Patient verbalized understanding of instructions that were given to them at the PAT appointment. Patient was also instructed that they will need to review over the PAT instructions again at home before surgery.

## 2019-05-13 ENCOUNTER — Ambulatory Visit (HOSPITAL_BASED_OUTPATIENT_CLINIC_OR_DEPARTMENT_OTHER): Payer: BC Managed Care – PPO | Admitting: Anesthesiology

## 2019-05-13 ENCOUNTER — Encounter (HOSPITAL_BASED_OUTPATIENT_CLINIC_OR_DEPARTMENT_OTHER): Payer: Self-pay

## 2019-05-13 ENCOUNTER — Encounter (HOSPITAL_BASED_OUTPATIENT_CLINIC_OR_DEPARTMENT_OTHER)
Admission: RE | Disposition: A | Discharge status: Home / Self Care | Payer: Self-pay | Source: Home / Self Care | Attending: Obstetrics & Gynecology

## 2019-05-13 ENCOUNTER — Ambulatory Visit (HOSPITAL_BASED_OUTPATIENT_CLINIC_OR_DEPARTMENT_OTHER)
Admission: RE | Admit: 2019-05-13 | Discharge: 2019-05-14 | Disposition: A | Discharge status: Home / Self Care | Payer: BC Managed Care – PPO | Attending: Obstetrics & Gynecology | Admitting: Obstetrics & Gynecology

## 2019-05-13 DIAGNOSIS — N736 Female pelvic peritoneal adhesions (postinfective): Secondary | ICD-10-CM

## 2019-05-13 DIAGNOSIS — Z79899 Other long term (current) drug therapy: Secondary | ICD-10-CM | POA: Diagnosis not present

## 2019-05-13 DIAGNOSIS — Z8249 Family history of ischemic heart disease and other diseases of the circulatory system: Secondary | ICD-10-CM | POA: Diagnosis not present

## 2019-05-13 DIAGNOSIS — Z91010 Allergy to peanuts: Secondary | ICD-10-CM | POA: Diagnosis not present

## 2019-05-13 DIAGNOSIS — Z833 Family history of diabetes mellitus: Secondary | ICD-10-CM | POA: Insufficient documentation

## 2019-05-13 DIAGNOSIS — D252 Subserosal leiomyoma of uterus: Secondary | ICD-10-CM | POA: Insufficient documentation

## 2019-05-13 DIAGNOSIS — N946 Dysmenorrhea, unspecified: Secondary | ICD-10-CM | POA: Diagnosis not present

## 2019-05-13 DIAGNOSIS — Z91040 Latex allergy status: Secondary | ICD-10-CM | POA: Diagnosis not present

## 2019-05-13 DIAGNOSIS — N809 Endometriosis, unspecified: Secondary | ICD-10-CM

## 2019-05-13 DIAGNOSIS — D649 Anemia, unspecified: Secondary | ICD-10-CM | POA: Insufficient documentation

## 2019-05-13 DIAGNOSIS — Z9889 Other specified postprocedural states: Secondary | ICD-10-CM

## 2019-05-13 DIAGNOSIS — N72 Inflammatory disease of cervix uteri: Secondary | ICD-10-CM | POA: Diagnosis not present

## 2019-05-13 DIAGNOSIS — R102 Pelvic and perineal pain: Secondary | ICD-10-CM

## 2019-05-13 DIAGNOSIS — Z8051 Family history of malignant neoplasm of kidney: Secondary | ICD-10-CM | POA: Diagnosis not present

## 2019-05-13 DIAGNOSIS — D219 Benign neoplasm of connective and other soft tissue, unspecified: Secondary | ICD-10-CM | POA: Diagnosis not present

## 2019-05-13 DIAGNOSIS — N888 Other specified noninflammatory disorders of cervix uteri: Secondary | ICD-10-CM | POA: Insufficient documentation

## 2019-05-13 DIAGNOSIS — I10 Essential (primary) hypertension: Secondary | ICD-10-CM | POA: Diagnosis not present

## 2019-05-13 DIAGNOSIS — Z6841 Body Mass Index (BMI) 40.0 and over, adult: Secondary | ICD-10-CM | POA: Diagnosis not present

## 2019-05-13 DIAGNOSIS — Z91018 Allergy to other foods: Secondary | ICD-10-CM | POA: Diagnosis not present

## 2019-05-13 DIAGNOSIS — Z882 Allergy status to sulfonamides status: Secondary | ICD-10-CM | POA: Diagnosis not present

## 2019-05-13 DIAGNOSIS — N800 Endometriosis of uterus: Secondary | ICD-10-CM | POA: Diagnosis not present

## 2019-05-13 DIAGNOSIS — Z9049 Acquired absence of other specified parts of digestive tract: Secondary | ICD-10-CM | POA: Diagnosis not present

## 2019-05-13 HISTORY — PX: ROBOTIC ASSISTED TOTAL HYSTERECTOMY: SHX6085

## 2019-05-13 HISTORY — PX: CYSTOSCOPY: SHX5120

## 2019-05-13 LAB — TYPE AND SCREEN
ABO/RH(D): O POS
Antibody Screen: NEGATIVE

## 2019-05-13 LAB — POCT PREGNANCY, URINE: Preg Test, Ur: NEGATIVE

## 2019-05-13 SURGERY — HYSTERECTOMY, TOTAL, ROBOT-ASSISTED
Anesthesia: General | Site: Ureter | Laterality: Bilateral

## 2019-05-13 MED ORDER — KETOROLAC TROMETHAMINE 30 MG/ML IJ SOLN
INTRAMUSCULAR | Status: AC
  Filled 2019-05-13: qty 1

## 2019-05-13 MED ORDER — MEPERIDINE HCL 25 MG/ML IJ SOLN
6.2500 mg | INTRAMUSCULAR | Status: DC | PRN
  Filled 2019-05-13: qty 1

## 2019-05-13 MED ORDER — DOCUSATE SODIUM 100 MG PO CAPS
ORAL_CAPSULE | ORAL | Status: AC
  Filled 2019-05-13: qty 1

## 2019-05-13 MED ORDER — HYDROCODONE-ACETAMINOPHEN 5-325 MG PO TABS
ORAL_TABLET | ORAL | Status: AC
  Filled 2019-05-13: qty 2

## 2019-05-13 MED ORDER — DEXAMETHASONE SODIUM PHOSPHATE 10 MG/ML IJ SOLN
INTRAMUSCULAR | Status: DC | PRN
  Administered 2019-05-13: 10 mg via INTRAVENOUS

## 2019-05-13 MED ORDER — LIDOCAINE 2% (20 MG/ML) 5 ML SYRINGE
INTRAMUSCULAR | Status: AC
  Filled 2019-05-13: qty 5

## 2019-05-13 MED ORDER — HYDROMORPHONE HCL 1 MG/ML IJ SOLN
INTRAMUSCULAR | Status: AC
  Filled 2019-05-13: qty 1

## 2019-05-13 MED ORDER — CEFAZOLIN SODIUM-DEXTROSE 2-4 GM/100ML-% IV SOLN
2.0000 g | INTRAVENOUS | Status: AC
  Administered 2019-05-13: 2 g via INTRAVENOUS
  Filled 2019-05-13: qty 100

## 2019-05-13 MED ORDER — SUGAMMADEX SODIUM 200 MG/2ML IV SOLN
INTRAVENOUS | Status: DC | PRN
  Administered 2019-05-13: 280 mg via INTRAVENOUS

## 2019-05-13 MED ORDER — LACTATED RINGERS IV SOLN
INTRAVENOUS | Status: DC
  Administered 2019-05-13: 07:00:00 via INTRAVENOUS
  Filled 2019-05-13: qty 1000

## 2019-05-13 MED ORDER — EPINEPHRINE 0.3 MG/0.3ML IJ SOAJ
0.3000 mg | INTRAMUSCULAR | Status: DC | PRN
  Filled 2019-05-13: qty 0.6

## 2019-05-13 MED ORDER — ACETAMINOPHEN 500 MG PO TABS
ORAL_TABLET | ORAL | Status: AC
  Filled 2019-05-13: qty 2

## 2019-05-13 MED ORDER — PANTOPRAZOLE SODIUM 40 MG PO TBEC
40.0000 mg | DELAYED_RELEASE_TABLET | Freq: Every day | ORAL | Status: DC
  Administered 2019-05-13: 40 mg via ORAL
  Filled 2019-05-13: qty 1

## 2019-05-13 MED ORDER — ONDANSETRON HCL 4 MG/2ML IJ SOLN
4.0000 mg | Freq: Once | INTRAMUSCULAR | Status: AC
  Administered 2019-05-13: 4 mg via INTRAVENOUS
  Filled 2019-05-13: qty 2

## 2019-05-13 MED ORDER — PROMETHAZINE HCL 25 MG/ML IJ SOLN
6.2500 mg | INTRAMUSCULAR | Status: DC | PRN
  Filled 2019-05-13: qty 1

## 2019-05-13 MED ORDER — KETOROLAC TROMETHAMINE 30 MG/ML IJ SOLN
INTRAMUSCULAR | Status: DC | PRN
  Administered 2019-05-13: 30 mg via INTRAVENOUS

## 2019-05-13 MED ORDER — TRAMADOL HCL 50 MG PO TABS
ORAL_TABLET | ORAL | Status: AC
  Filled 2019-05-13: qty 1

## 2019-05-13 MED ORDER — GABAPENTIN 300 MG PO CAPS
ORAL_CAPSULE | ORAL | Status: AC
  Filled 2019-05-13: qty 1

## 2019-05-13 MED ORDER — ONDANSETRON HCL 4 MG/2ML IJ SOLN
INTRAMUSCULAR | Status: DC | PRN
  Administered 2019-05-13: 4 mg via INTRAVENOUS

## 2019-05-13 MED ORDER — MIDAZOLAM HCL 2 MG/2ML IJ SOLN
INTRAMUSCULAR | Status: AC
  Filled 2019-05-13: qty 2

## 2019-05-13 MED ORDER — TRAMADOL HCL 50 MG PO TABS
50.0000 mg | ORAL_TABLET | Freq: Four times a day (QID) | ORAL | Status: DC | PRN
  Administered 2019-05-13 – 2019-05-14 (×2): 50 mg via ORAL
  Filled 2019-05-13: qty 1

## 2019-05-13 MED ORDER — ACETAMINOPHEN 500 MG PO TABS
1000.0000 mg | ORAL_TABLET | ORAL | Status: AC
  Administered 2019-05-13: 06:00:00 1000 mg via ORAL
  Filled 2019-05-13: qty 2

## 2019-05-13 MED ORDER — BUPIVACAINE HCL (PF) 0.5 % IJ SOLN
INTRAMUSCULAR | Status: DC | PRN
  Administered 2019-05-13: 30 mL

## 2019-05-13 MED ORDER — ONDANSETRON HCL 4 MG PO TABS
4.0000 mg | ORAL_TABLET | Freq: Four times a day (QID) | ORAL | Status: DC | PRN
  Filled 2019-05-13: qty 1

## 2019-05-13 MED ORDER — FENTANYL CITRATE (PF) 250 MCG/5ML IJ SOLN
INTRAMUSCULAR | Status: AC
  Filled 2019-05-13: qty 5

## 2019-05-13 MED ORDER — IBUPROFEN 800 MG PO TABS: 800.0000 mg | ORAL_TABLET | Freq: Three times a day (TID) | ORAL | 0 refills | Status: DC | PRN

## 2019-05-13 MED ORDER — FENTANYL CITRATE (PF) 100 MCG/2ML IJ SOLN
INTRAMUSCULAR | Status: DC | PRN
  Administered 2019-05-13: 100 ug via INTRAVENOUS
  Administered 2019-05-13 (×2): 50 ug via INTRAVENOUS

## 2019-05-13 MED ORDER — HYDROMORPHONE HCL 1 MG/ML IJ SOLN
0.2000 mg | INTRAMUSCULAR | Status: DC | PRN
  Administered 2019-05-13: 0.6 mg via INTRAVENOUS
  Filled 2019-05-13: qty 1

## 2019-05-13 MED ORDER — ONDANSETRON HCL 4 MG/2ML IJ SOLN
INTRAMUSCULAR | Status: AC
  Filled 2019-05-13: qty 2

## 2019-05-13 MED ORDER — HYDROMORPHONE HCL 1 MG/ML IJ SOLN
0.2500 mg | INTRAMUSCULAR | Status: DC | PRN
  Administered 2019-05-13: 0.25 mg via INTRAVENOUS
  Filled 2019-05-13: qty 0.5

## 2019-05-13 MED ORDER — LIDOCAINE 2% (20 MG/ML) 5 ML SYRINGE
INTRAMUSCULAR | Status: DC | PRN
  Administered 2019-05-13: 1.5 mg/kg/h via INTRAVENOUS

## 2019-05-13 MED ORDER — ARTIFICIAL TEARS OPHTHALMIC OINT
TOPICAL_OINTMENT | OPHTHALMIC | Status: AC
  Filled 2019-05-13: qty 3.5

## 2019-05-13 MED ORDER — ROCURONIUM BROMIDE 10 MG/ML (PF) SYRINGE
PREFILLED_SYRINGE | INTRAVENOUS | Status: AC
  Filled 2019-05-13: qty 10

## 2019-05-13 MED ORDER — GABAPENTIN 300 MG PO CAPS
300.0000 mg | ORAL_CAPSULE | ORAL | Status: AC
  Administered 2019-05-13: 300 mg via ORAL
  Filled 2019-05-13: qty 1

## 2019-05-13 MED ORDER — DOCUSATE SODIUM 100 MG PO CAPS
100.0000 mg | ORAL_CAPSULE | Freq: Two times a day (BID) | ORAL | Status: DC
  Administered 2019-05-13 (×2): 100 mg via ORAL
  Filled 2019-05-13: qty 1

## 2019-05-13 MED ORDER — METHYLENE BLUE 0.5 % INJ SOLN
INTRAVENOUS | Status: DC | PRN
  Administered 2019-05-13: 5 mL via INTRAVENOUS

## 2019-05-13 MED ORDER — MIDAZOLAM HCL 5 MG/5ML IJ SOLN
INTRAMUSCULAR | Status: DC | PRN
  Administered 2019-05-13: 2 mg via INTRAVENOUS

## 2019-05-13 MED ORDER — BISACODYL 10 MG RE SUPP
10.0000 mg | Freq: Every day | RECTAL | Status: DC | PRN
  Filled 2019-05-13: qty 1

## 2019-05-13 MED ORDER — POLYETHYLENE GLYCOL 3350 17 G PO PACK
17.0000 g | PACK | Freq: Every day | ORAL | Status: DC | PRN
  Filled 2019-05-13: qty 1

## 2019-05-13 MED ORDER — SODIUM CHLORIDE 0.9 % IR SOLN
Status: DC | PRN
  Administered 2019-05-13: 3000 mL

## 2019-05-13 MED ORDER — ONDANSETRON HCL 4 MG/2ML IJ SOLN
4.0000 mg | Freq: Four times a day (QID) | INTRAMUSCULAR | Status: DC | PRN
  Administered 2019-05-13: 4 mg via INTRAVENOUS
  Filled 2019-05-13: qty 2

## 2019-05-13 MED ORDER — PROPOFOL 10 MG/ML IV BOLUS
INTRAVENOUS | Status: AC
  Filled 2019-05-13: qty 40

## 2019-05-13 MED ORDER — ROCURONIUM BROMIDE 50 MG/5ML IV SOSY
PREFILLED_SYRINGE | INTRAVENOUS | Status: DC | PRN
  Administered 2019-05-13 (×2): 20 mg via INTRAVENOUS
  Administered 2019-05-13: 70 mg via INTRAVENOUS

## 2019-05-13 MED ORDER — MENTHOL 3 MG MT LOZG
1.0000 | LOZENGE | OROMUCOSAL | Status: DC | PRN
  Filled 2019-05-13: qty 9

## 2019-05-13 MED ORDER — PANTOPRAZOLE SODIUM 40 MG PO TBEC
DELAYED_RELEASE_TABLET | ORAL | Status: AC
  Filled 2019-05-13: qty 1

## 2019-05-13 MED ORDER — HYDROCODONE-ACETAMINOPHEN 5-325 MG PO TABS
ORAL_TABLET | ORAL | Status: AC
  Filled 2019-05-13: qty 1

## 2019-05-13 MED ORDER — LIDOCAINE 2% (20 MG/ML) 5 ML SYRINGE
INTRAMUSCULAR | Status: DC | PRN
  Administered 2019-05-13: 100 mg via INTRAVENOUS

## 2019-05-13 MED ORDER — SIMETHICONE 80 MG PO CHEW
80.0000 mg | CHEWABLE_TABLET | Freq: Four times a day (QID) | ORAL | Status: DC | PRN
  Filled 2019-05-13: qty 1

## 2019-05-13 MED ORDER — HYDROCODONE-ACETAMINOPHEN 5-325 MG PO TABS
1.0000 | ORAL_TABLET | ORAL | Status: DC | PRN
  Administered 2019-05-13: 2 via ORAL
  Administered 2019-05-13 (×2): 1 via ORAL
  Administered 2019-05-13 – 2019-05-14 (×3): 2 via ORAL
  Filled 2019-05-13: qty 2

## 2019-05-13 MED ORDER — HYDROCODONE-ACETAMINOPHEN 5-325 MG PO TABS: 1.0000 | ORAL_TABLET | Freq: Four times a day (QID) | ORAL | 0 refills | Status: DC | PRN

## 2019-05-13 MED ORDER — ZOLPIDEM TARTRATE 5 MG PO TABS
5.0000 mg | ORAL_TABLET | Freq: Every evening | ORAL | Status: DC | PRN
  Filled 2019-05-13: qty 1

## 2019-05-13 MED ORDER — KETOROLAC TROMETHAMINE 30 MG/ML IJ SOLN
30.0000 mg | Freq: Four times a day (QID) | INTRAMUSCULAR | Status: DC
  Administered 2019-05-13 – 2019-05-14 (×3): 30 mg via INTRAVENOUS
  Filled 2019-05-13: qty 1

## 2019-05-13 MED ORDER — DEXTROSE-NACL 5-0.45 % IV SOLN
INTRAVENOUS | Status: AC
  Administered 2019-05-13: 12:00:00 via INTRAVENOUS
  Filled 2019-05-13: qty 1000

## 2019-05-13 MED ORDER — SOD CITRATE-CITRIC ACID 500-334 MG/5ML PO SOLN
30.0000 mL | ORAL | Status: DC
  Filled 2019-05-13: qty 30

## 2019-05-13 MED ORDER — DEXAMETHASONE SODIUM PHOSPHATE 10 MG/ML IJ SOLN
INTRAMUSCULAR | Status: AC
  Filled 2019-05-13: qty 1

## 2019-05-13 MED ORDER — IBUPROFEN 800 MG PO TABS
800.0000 mg | ORAL_TABLET | Freq: Four times a day (QID) | ORAL | Status: DC
  Filled 2019-05-13: qty 1

## 2019-05-13 MED ORDER — CEFAZOLIN SODIUM-DEXTROSE 2-4 GM/100ML-% IV SOLN
INTRAVENOUS | Status: AC
  Filled 2019-05-13: qty 100

## 2019-05-13 MED ORDER — PROPOFOL 10 MG/ML IV BOLUS
INTRAVENOUS | Status: DC | PRN
  Administered 2019-05-13: 40 mg via INTRAVENOUS
  Administered 2019-05-13: 200 mg via INTRAVENOUS

## 2019-05-13 SURGICAL SUPPLY — 63 items
APPLICATOR ARISTA FLEXITIP XL (MISCELLANEOUS) ×4 IMPLANT
CATH SILICONE 16FRX5CC (CATHETERS) ×4 IMPLANT
COVER BACK TABLE 60X90IN (DRAPES) ×4 IMPLANT
COVER TIP SHEARS 8 DVNC (MISCELLANEOUS) ×2 IMPLANT
COVER TIP SHEARS 8MM DA VINCI (MISCELLANEOUS) ×2
DECANTER SPIKE VIAL GLASS SM (MISCELLANEOUS) ×4 IMPLANT
DEFOGGER SCOPE WARMER CLEARIFY (MISCELLANEOUS) ×4 IMPLANT
DERMABOND ADVANCED (GAUZE/BANDAGES/DRESSINGS) ×2
DERMABOND ADVANCED .7 DNX12 (GAUZE/BANDAGES/DRESSINGS) ×2 IMPLANT
DRAPE ARM DVNC X/XI (DISPOSABLE) ×6 IMPLANT
DRAPE COLUMN DVNC XI (DISPOSABLE) ×2 IMPLANT
DRAPE DA VINCI XI ARM (DISPOSABLE) ×6
DRAPE DA VINCI XI COLUMN (DISPOSABLE) ×2
DRSG TEGADERM 4X4.75 (GAUZE/BANDAGES/DRESSINGS) ×8 IMPLANT
DURAPREP 26ML APPLICATOR (WOUND CARE) ×4 IMPLANT
ELECT REM PT RETURN 9FT ADLT (ELECTROSURGICAL) ×4
ELECTRODE REM PT RTRN 9FT ADLT (ELECTROSURGICAL) ×2 IMPLANT
GAUZE SPONGE 4X4 16PLY XRAY LF (GAUZE/BANDAGES/DRESSINGS) ×4 IMPLANT
GLOVE BIOGEL PI IND STRL 6.5 (GLOVE) ×2 IMPLANT
GLOVE BIOGEL PI IND STRL 7.0 (GLOVE) ×2 IMPLANT
GLOVE BIOGEL PI IND STRL 7.5 (GLOVE) ×4 IMPLANT
GLOVE BIOGEL PI INDICATOR 6.5 (GLOVE) ×2
GLOVE BIOGEL PI INDICATOR 7.0 (GLOVE) ×2
GLOVE BIOGEL PI INDICATOR 7.5 (GLOVE) ×4
GLOVE SURG SS PI 6.5 STRL IVOR (GLOVE) ×12 IMPLANT
HEMOSTAT ARISTA ABSORB 3G PWDR (HEMOSTASIS) ×4 IMPLANT
HOLDER FOLEY CATH W/STRAP (MISCELLANEOUS) ×4 IMPLANT
IRRIG SUCT STRYKERFLOW 2 WTIP (MISCELLANEOUS) ×4
IRRIGATION SUCT STRKRFLW 2 WTP (MISCELLANEOUS) ×2 IMPLANT
IV NS 1000ML (IV SOLUTION) ×2
IV NS 1000ML BAXH (IV SOLUTION) ×2 IMPLANT
LEGGING LITHOTOMY PAIR STRL (DRAPES) ×4 IMPLANT
MANIFOLD NEPTUNE II (INSTRUMENTS) ×4 IMPLANT
NEEDLE INSUFFLATION 120MM (ENDOMECHANICALS) ×4 IMPLANT
OBTURATOR OPTICAL STANDARD 8MM (TROCAR) ×2
OBTURATOR OPTICAL STND 8 DVNC (TROCAR) ×2
OBTURATOR OPTICALSTD 8 DVNC (TROCAR) ×2 IMPLANT
OCCLUDER COLPOPNEUMO (BALLOONS) ×4 IMPLANT
PACK ROBOT WH (CUSTOM PROCEDURE TRAY) ×4 IMPLANT
PACK ROBOTIC GOWN (GOWN DISPOSABLE) ×4 IMPLANT
PACK TRENDGUARD 450 HYBRID PRO (MISCELLANEOUS) ×2 IMPLANT
PAD PREP 24X48 CUFFED NSTRL (MISCELLANEOUS) ×4 IMPLANT
PROTECTOR NERVE ULNAR (MISCELLANEOUS) ×8 IMPLANT
SEAL CANN UNIV 5-8 DVNC XI (MISCELLANEOUS) ×6 IMPLANT
SEAL XI 5MM-8MM UNIVERSAL (MISCELLANEOUS) ×6
SEALER VESSEL DA VINCI XI (MISCELLANEOUS) ×2
SEALER VESSEL EXT DVNC XI (MISCELLANEOUS) ×2 IMPLANT
SET IRRIG Y TYPE TUR BLADDER L (SET/KITS/TRAYS/PACK) ×4 IMPLANT
SET TRI-LUMEN FLTR TB AIRSEAL (TUBING) ×4 IMPLANT
SUT VIC AB 0 CT1 27 (SUTURE) ×6
SUT VIC AB 0 CT1 27XBRD ANBCTR (SUTURE) ×6 IMPLANT
SUT VICRYL 0 UR6 27IN ABS (SUTURE) IMPLANT
SUT VICRYL 4-0 PS2 18IN ABS (SUTURE) ×8 IMPLANT
SUT VLOC 180 0 9IN  GS21 (SUTURE) ×4
SUT VLOC 180 0 9IN GS21 (SUTURE) ×4 IMPLANT
SYSTEM CARTER THOMASON II (TROCAR) IMPLANT
TIP RUMI ORANGE 6.7MMX12CM (TIP) ×4 IMPLANT
TIP UTERINE 6.7X6CM WHT DISP (MISCELLANEOUS) ×4 IMPLANT
TOWEL OR 17X26 10 PK STRL BLUE (TOWEL DISPOSABLE) ×4 IMPLANT
TRENDGUARD 450 HYBRID PRO PACK (MISCELLANEOUS) ×4
TROCAR BLADELESS OPT 5 100 (ENDOMECHANICALS) ×4 IMPLANT
TROCAR BLADELESS OPT 5 150 (ENDOMECHANICALS) ×4 IMPLANT
TROCAR PORT AIRSEAL 5X120 (TROCAR) ×4 IMPLANT

## 2019-05-13 NOTE — H&P (Signed)
Preoperative History and Physical  Jodi Saunders is a 48 y.o. [id number] here for surgical management of pelvic pain, fibroids and AUB.   Proposed surgery: Robot assisted total laparoscopic hysterectomy withy bilateral salpingectomy.  Past Medical History:  Diagnosis Date  . Abnormal uterine bleeding 04/21/2013  . Allergy   . Anemia   . Elevated blood pressure reading    previously  prescribed medication never took it, now diet controlled (lost 30 pounds)   . Multiple allergies    Past Surgical History:  Procedure Laterality Date  . APPENDECTOMY    . BREAST BIOPSY Left 08/2016  . CHOLECYSTECTOMY  2011  . HYSTEROSCOPY WITH NOVASURE  06/03/2012   Procedure: HYSTEROSCOPY WITH NOVASURE;  Surgeon: [name], MD;  Location: [facility];  Service: Gynecology;  Laterality: N/A;  . LAPAROSCOPIC APPENDECTOMY N/A 12/14/2015   Procedure: APPENDECTOMY LAPAROSCOPIC CONVERTED TO OPEN;  Surgeon: Autumn Messing III, MD;  Location: [facility];  Service: General;  Laterality: N/A;  . MULTIPLE TOOTH EXTRACTIONS    . [place]  2002  . TONSILLECTOMY  2008  . VAGINAL DELIVERY  1992  . WISDOM TOOTH EXTRACTION     OB History    Gravida  3   Para  1   Term  1   Preterm      AB  2   Living  1     SAB      TAB  2   Ectopic      Multiple      Live Births  1          Patient denies any cervical dysplasia or STIs. Medications Prior to Admission  Medication Sig Dispense Refill Last Dose  . Biotin 1000 MCG tablet Take 1,000 mcg by mouth daily.    05/12/2019 at Unknown time  . Omega-3 1000 MG CAPS Take 1,000 mg by mouth daily.    Past Week at Unknown time  . cetirizine (ZYRTEC) 10 MG tablet Take 10 mg by mouth daily as needed for allergies.    More than a month at Unknown time  . cholecalciferol (VITAMIN D) 1000 units tablet Take 1 tablet (1,000 Units total) by mouth daily. (Patient not taking: Reported on 05/08/2019) 30 tablet 3 Not Taking at Unknown time  . EPINEPHrine (EPIPEN  2-PAK) 0.3 mg/0.3 mL IJ SOAJ injection USE AS DIRECTED FOR SEVERE ALLERGIC REACTION (Patient taking differently: Inject 0.3 mg into the muscle as needed for anaphylaxis. ) 1 Device 0 Unknown at Unknown time    Allergies  Allergen Reactions  . Peanut-Containing Drug Products     Facial swelling  . Other     Tree nuts  . Sulfonamide Derivatives Nausea And Vomiting    lightheaded  . Latex Rash    Blisters   Social History:   reports that she has never smoked. She has never used smokeless tobacco. She reports previous alcohol use. She reports that she does not use drugs. Family History  Problem Relation Age of Onset  . Diabetes Father   . Hypertension Father   . Hypertension Mother   . Renal cancer Brother   . Diabetes Paternal Grandmother   . Hypertension Paternal Grandmother   . Diabetes Paternal Grandfather   . Hypertension Paternal Grandfather   . Allergic rhinitis Neg Hx   . Angioedema Neg Hx   . Asthma Neg Hx   . Atopy Neg Hx   . Eczema Neg Hx   . Immunodeficiency Neg Hx   .  Urticaria Neg Hx     Review of Systems: Noncontributory  PHYSICAL EXAM: Blood pressure (!) 149/94, pulse 79, temperature 98.2 F (36.8 C), temperature source Oral, resp. rate 18, height 5\' 4"  (1.626 m), weight 122 kg, last menstrual period 04/25/2019. General appearance - alert, well appearing, and in no distress Chest - clear to auscultation, no wheezes, rales or rhonchi, symmetric air entry Heart - normal rate and regular rhythm Abdomen - soft, nontender, nondistended, no masses or organomegaly Pelvic - examination not indicated Extremities - peripheral pulses normal, no pedal edema, no clubbing or cyanosis  Labs: Results for orders placed or performed during the hospital encounter of 05/13/19 (from the past 336 hour(s))  Pregnancy, urine POC   Collection Time: 05/13/19  6:17 AM  Result Value Ref Range   Preg Test, Ur NEGATIVE NEGATIVE  Results for orders placed or performed during the  hospital encounter of 05/12/19 (from the past 336 hour(s))  Basic metabolic panel   Collection Time: 05/12/19  9:08 AM  Result Value Ref Range   Sodium 138 135 - 145 mmol/L   Potassium 3.9 3.5 - 5.1 mmol/L   Chloride 104 98 - 111 mmol/L   CO2 25 22 - 32 mmol/L   Glucose, Bld 109 (H) 70 - 99 mg/dL   BUN 10 6 - 20 mg/dL   Creatinine, Ser 0.64 0.44 - 1.00 mg/dL   Calcium 9.2 8.9 - 10.3 mg/dL   GFR calc non Af Amer >60 >60 mL/min   GFR calc Af Amer >60 >60 mL/min   Anion gap 9 5 - 15  CBC   Collection Time: 05/12/19  9:08 AM  Result Value Ref Range   WBC 7.4 4.0 - 10.5 K/uL   RBC 4.78 3.87 - 5.11 MIL/uL   Hemoglobin 10.5 (L) 12.0 - 15.0 g/dL   HCT 34.3 (L) 36.0 - 46.0 %   MCV 71.8 (L) 80.0 - 100.0 fL   MCH 22.0 (L) 26.0 - 34.0 pg   MCHC 30.6 30.0 - 36.0 g/dL   RDW 15.1 11.5 - 15.5 %   Platelets 343 150 - 400 K/uL   nRBC 0.0 0.0 - 0.2 %  Type and screen [place]   Collection Time: 05/12/19  9:08 AM  Result Value Ref Range   ABO/RH(D) O POS    Antibody Screen NEG    Sample Expiration 05/16/2019,2359    Extend sample reason      NO TRANSFUSIONS OR PREGNANCY IN THE PAST 3 MONTHS Performed at [facility], [address], [place], [place] [address]   ABO/Rh   Collection Time: 05/12/19  9:08 AM  Result Value Ref Range   ABO/RH(D)      O POS Performed at [facility], [place] [address], [place], [place] [address]   Results for orders placed or performed during the hospital encounter of 05/09/19 (from the past 336 hour(s))  Novel Coronavirus, NAA (Hosp order, Send-out to Ref Lab; TAT 18-24 hrs   Collection Time: 05/09/19  8:24 AM   Specimen: Nasopharyngeal Swab; Respiratory  Result Value Ref Range   SARS-CoV-2, NAA NOT DETECTED NOT DETECTED   Coronavirus Source NASOPHARYNGEAL     Imaging Studies: 02/05/2019 CLINICAL DATA:  Pelvic pain, dysmenorrhea  EXAM: TRANSABDOMINAL AND TRANSVAGINAL ULTRASOUND OF  PELVIS  TECHNIQUE: Both transabdominal and transvaginal ultrasound examinations of the pelvis were performed. Transabdominal technique was performed for global imaging of the pelvis including uterus, ovaries, adnexal regions, and pelvic cul-de-sac. It was  necessary to proceed with endovaginal exam following the transabdominal exam to visualize the endometrium and ovaries.  COMPARISON:  10/13/2011  FINDINGS: Uterus  Measurements: 9.3 x 7.3 x 8.0 cm = volume: 283 mL. Mildly heterogeneous echogenicity of myometrium. Several focal nodules are identified consistent with probable leiomyomata. These include an anterior LEFT side subserosal mass 3.3 x 3.0 x 3.1 cm, and anterior upper uterine segment intramural mass 3.7 x 3.6 x 3.6 cm, and a posterior subserosal mass slightly to the RIGHT 3.4 x 2.8 x 2.3 cm.  Endometrium  Thickness: 5 mm. Suboptimally visualized, likely due to distortion of uterus by multiple leiomyomata. No endometrial fluid or definite focal abnormality  Right ovary  Measurements: 2.4 x 1.8 x 1.7 cm = volume: 3.9 mL. Normal morphology without mass  Left ovary  Measurements: 3.3 x 1.7 x 2.2 cm = volume: 6.3 mL. Normal morphology without mass  Other findings  No free pelvic fluid or adnexal masses.  IMPRESSION: Multiple probable uterine leiomyomata.  Otherwise negative exam.    Assessment: Patient Active Problem List   Diagnosis Date Noted  . Post-operative state 05/13/2019  . Pelvic pain in female 05/06/2019  . Hyperplastic polyp of ascending colon 12/02/2018  . Hyperlipidemia 03/19/2018  . Severe dysmenorrhea 10/11/2011  . Congenital pes planus 11/19/2007  . OBESITY, NOS 08/16/2006  . Anemia 08/16/2006    Plan: Patient will undergo surgical management with Robot assisted total laparoscopic hysterectomy withy bilateral salpingectomy. The risks of surgery were discussed in detail with the patient including but not limited to:  bleeding which may require transfusion or reoperation; infection which may require antibiotics; injury to surrounding organs which may involve bowel, bladder, ureters ; need for additional procedures including laparoscopy or laparotomy; thromboembolic phenomenon, surgical site problems and other postoperative/anesthesia complications. Likelihood of success in alleviating the patient's condition was discussed. Routine postoperative instructions will be reviewed with the patient and her family in detail after surgery.  The patient concurred with the proposed plan, giving informed written consent for the surgery.  Patient has been NPO since last night she will remain NPO for procedure.  Anesthesia and OR aware.  Preoperative prophylactic antibiotics and SCDs ordered on call to the OR.  To OR when ready.  Lem Peary L. Ihor Dow, M.D., [facility] 05/13/2019 7:15 AM

## 2019-05-13 NOTE — Transfer of Care (Signed)
Immediate Anesthesia Transfer of Care Note  Patient: Jodi Saunders  Procedure(s) Performed: XI ROBOTIC ASSISTED TOTAL HYSTERECTOMY WITH SALPINGECTOMY, LYSIS OF ADHESION (Bilateral ) CYSTOSCOPY (Bilateral Ureter)  Patient Location: PACU  Anesthesia Type:General  Level of Consciousness: drowsy  Airway & Oxygen Therapy: Patient Spontanous Breathing and Patient connected to nasal cannula oxygen  Post-op Assessment: Report given to RN  Post vital signs: Reviewed and stable  Last Vitals:  Vitals Value Taken Time  BP 140/88 05/13/19 0949  Temp    Pulse 73 05/13/19 0950  Resp 17 05/13/19 0950  SpO2 98 % 05/13/19 0950  Vitals shown include unvalidated device data.  Last Pain:  Vitals:   05/13/19 0618  TempSrc: Oral      Patients Stated Pain Goal: 3 ([id number])  Complications: No apparent anesthesia complications

## 2019-05-13 NOTE — Op Note (Signed)
05/13/2019  9:37 AM  PATIENT:  Jodi Saunders  48 y.o. female  PRE-OPERATIVE DIAGNOSIS:  Pelvic Pain; uterine fibroids; Dysmenorrhea  POST-OPERATIVE DIAGNOSIS:  Pelvic Pain; pelvic adhesions; endometriosis;  Dysmenorrhea; uterine fibroids  PROCEDURE:  Procedure(s): XI ROBOTIC ASSISTED TOTAL HYSTERECTOMY WITH SALPINGECTOMY, LYSIS OF ADHESION (Bilateral) CYSTOSCOPY (Bilateral)  SURGEON:  Surgeon(s) and Role:    * Lavonia Drafts, MD - Primary    * Constant, Peggy, MD - Assisting  ANESTHESIA:   general  EBL:50cc  BLOOD ADMINISTERED:none  DRAINS: none   LOCAL MEDICATIONS USED:  MARCAINE     SPECIMEN:  Source of Specimen:  uterus; fallopian tubes; cervix  DISPOSITION OF SPECIMEN:  PATHOLOGY  COUNTS:  YES  TOURNIQUET:  * No tourniquets in log *  DICTATION: .Note written in EPIC  PLAN OF CARE: discharge after prolonged observation  PATIENT DISPOSITION:  PACU - hemodynamically stable.   Delay start of Pharmacological VTE agent (>24hrs) due to surgical blood loss or risk of bleeding: yes  Complications: none immediate  Findings:   The pelvis was inspected and the uterus was found to have fibroids and be enlarged.  The fallopian tubes and ovaries were found to be normal. There were adhesions of omentum to the anterior abdominal wall. There were also peritoneal adhesion in the pelvis to the adnexa and sidewall.   The risks, benefits, and alternatives of surgery were explained, understood, and accepted. Consents were signed. All questions were answered. She was taken to the operating room and general anesthesia was applied without complication. She was placed in the dorsal lithotomy position and her abdomen and vagina were prepped and draped after she had been carefully positioned on the table. A bimanual exam revealed a 12 week size uterus that was mobile. Her adnexa were not enlarged. The cervix was measured and the uterus was sounded to 10 cm. A Rumi uterine  manipulator was placed without difficulty. A Foley catheter was placed and it drained clear throughout the case. Gloves were changed and attention was turned to the abdomen. A 32mm incision was made in the left upper quadrant and an Optiview trocar with the laparoscope was inserted under direct visualization. CO2 was used to insufflate the abdomen to approximately 4 L. After good pneumoperitoneum was established, a 70mm trocar was placed in the umbilicus.  Laparoscopy confirmed correct placement. She was placed in Trendelenburg position and ports were placed in appropriate positions on her abdomen to allow maximum exposure during the robotic case. Specifically there were two 8 mm ports were placed 8cm lateral to the midline port.  These were all placed under direct laparoscopic visualization. The robot was docked and I proceeded with a robotic portion of the case.  The ureters and the infundibulopelvic ligaments were identified. I excised the fallopian tubes bilaterally. The round ligament on each side was cauterized and cut. The Vessel Sealer instrument was used for this portion. The round ligaments were identified, cauterized and ligated, a bladder flap was created anteriorly. The uterine vessels were identified and cauterized and then cut.The bladder was pushed out of the operative site and an anterior colpotomy was made. The colpotomy incision was extended circumferentially, following the blue outline of the Rumi manipulator. All pedicles were hemostatic.  The uterus was removed from the vagina with the fallopian tube segments. The vaginal cuff was closed with v-lock suture.  Excellent hemostasis was noted throughout. The pelvis was irrigated. The intraabdominal pressure was lowered assess hemostasis. After determining excellent hemostasis, I performed cystoscopy. The cystoscopy revealed  blue ejection from both ureters. The robot was undocked. The skin from all of the other ports was closed with 3-0 vicryl and  covered with Dermabond. 30cc of 0.5% Marcaine was injected into the port sites.  The patient was then extubated and taken to recovery in stable condition.   Sponge, lap and needle counts were correct x 2.  An experienced assistant was required given the standard of surgical care given the complexity of the case.  This assistant was needed for exposure, dissection, suctioning, retraction, instrument exchange, assisting with delivery with administration of fundal pressure, and for overall help during the procedure.  Mayra Brahm L. Harraway-Smith, M.D., [facility]

## 2019-05-13 NOTE — Discharge Instructions (Signed)

## 2019-05-13 NOTE — Anesthesia Procedure Notes (Signed)
Procedure Name: Intubation Date/Time: 05/13/2019 7:33 AM Performed by: Bonney Aid, CRNA Pre-anesthesia Checklist: Patient identified, Emergency Drugs available, Suction available and Patient being monitored Patient Re-evaluated:Patient Re-evaluated prior to induction Oxygen Delivery Method: Circle system utilized Preoxygenation: Pre-oxygenation with 100% oxygen Induction Type: IV induction Ventilation: Mask ventilation without difficulty Laryngoscope Size: Mac and 3 Grade View: Grade II Tube type: Oral Tube size: 7.0 mm Number of attempts: 1 Airway Equipment and Method: Stylet Placement Confirmation: ETT inserted through vocal cords under direct vision,  positive ETCO2 and breath sounds checked- equal and bilateral Secured at: 21 cm Tube secured with: Tape Dental Injury: Teeth and Oropharynx as per pre-operative assessment

## 2019-05-13 NOTE — Brief Op Note (Signed)
05/13/2019  9:37 AM  PATIENT:  Jodi Saunders  48 y.o. female  PRE-OPERATIVE DIAGNOSIS:  Pelvic Pain; uterine fibroids; Dysmenorrhea  POST-OPERATIVE DIAGNOSIS:  Pelvic Pain; pelvic adhesions; endometriosis;  Dysmenorrhea; uterine fibroids  PROCEDURE:  Procedure(s): XI ROBOTIC ASSISTED TOTAL HYSTERECTOMY WITH SALPINGECTOMY, LYSIS OF ADHESION (Bilateral) CYSTOSCOPY (Bilateral)  SURGEON:  Surgeon(s) and Role:    * Lavonia Drafts, MD - Primary    * Constant, Peggy, MD - Assisting  ANESTHESIA:   general  EBL:50cc  BLOOD ADMINISTERED:none  DRAINS: none   LOCAL MEDICATIONS USED:  MARCAINE     SPECIMEN:  Source of Specimen:  uterus; fallopian tubes; cervix  DISPOSITION OF SPECIMEN:  PATHOLOGY  COUNTS:  YES  TOURNIQUET:  * No tourniquets in log *  DICTATION: .Note written in EPIC  PLAN OF CARE: discharge after prolonged observation  PATIENT DISPOSITION:  PACU - hemodynamically stable.   Delay start of Pharmacological VTE agent (>24hrs) due to surgical blood loss or risk of bleeding: yes  Complications: none immediate  Davidlee Jeanbaptiste L. Harraway-Smith, M.D., [facility]

## 2019-05-14 ENCOUNTER — Other Ambulatory Visit: Payer: Self-pay | Admitting: Obstetrics & Gynecology

## 2019-05-14 ENCOUNTER — Encounter (HOSPITAL_BASED_OUTPATIENT_CLINIC_OR_DEPARTMENT_OTHER): Payer: Self-pay | Admitting: Obstetrics & Gynecology

## 2019-05-14 DIAGNOSIS — R110 Nausea: Secondary | ICD-10-CM

## 2019-05-14 DIAGNOSIS — T8859XA Other complications of anesthesia, initial encounter: Secondary | ICD-10-CM

## 2019-05-14 DIAGNOSIS — N946 Dysmenorrhea, unspecified: Secondary | ICD-10-CM | POA: Diagnosis not present

## 2019-05-14 LAB — SURGICAL PATHOLOGY

## 2019-05-14 MED ORDER — ENOXAPARIN SODIUM 40 MG/0.4ML ~~LOC~~ SOLN
40.0000 mg | Freq: Once | SUBCUTANEOUS | Status: AC
  Administered 2019-05-14: 40 mg via SUBCUTANEOUS
  Filled 2019-05-14: qty 0.4

## 2019-05-14 MED ORDER — ONDANSETRON 4 MG PO TBDP: 4.0000 mg | ORAL_TABLET | Freq: Four times a day (QID) | ORAL | 0 refills | Status: DC | PRN

## 2019-05-14 MED ORDER — HYDROCODONE-ACETAMINOPHEN 5-325 MG PO TABS
ORAL_TABLET | ORAL | Status: AC
  Filled 2019-05-14: qty 2

## 2019-05-14 MED ORDER — TRAMADOL HCL 50 MG PO TABS
ORAL_TABLET | ORAL | Status: AC
  Filled 2019-05-14: qty 1

## 2019-05-14 MED ORDER — ENOXAPARIN SODIUM 40 MG/0.4ML ~~LOC~~ SOLN
SUBCUTANEOUS | Status: AC
  Filled 2019-05-14: qty 0.4

## 2019-05-14 MED ORDER — KETOROLAC TROMETHAMINE 30 MG/ML IJ SOLN
INTRAMUSCULAR | Status: AC
  Filled 2019-05-14: qty 1

## 2019-05-14 NOTE — Discharge Summary (Signed)
Physician Discharge Summary  Patient ID: Jodi Saunders MRN: [id number] DOB/AGE: 1970/09/11 48 y.o.  Admit date: 05/13/2019 Discharge date: 05/14/2019  Admission Diagnoses: pelvic pain; dysmenorrhea   Discharge Diagnoses:  Active Problems:   Severe dysmenorrhea   Pelvic pain in female   Post-operative state   Pelvic peritoneal adhesions, female   Endometriosis determined by laparoscopy   Fibroids   Discharged Condition: good  Hospital Course: Patient had an uncomplicated surgery; for further details of this surgery, please refer to the operative note. Furthermore, the patient had an uncomplicated postoperative course.  By time of discharge, her pain was controlled on oral pain medications; she was ambulating, voiding without difficulty, tolerating regular diet and passing flatus. She did have some nausea overnight that was controlled with meds. She reports that it is much better this am. She was deemed stable for discharge to home.    Treatments: surgery: Robot assisted total laparoscopic hysterectomy with bilateral salpingectomy   Discharge Exam: Blood pressure (!) 159/84, pulse 72, temperature 98.2 F (36.8 C), resp. rate 18, height 5\' 4"  (1.626 m), weight 122 kg, last menstrual period 04/25/2019, SpO2 96 %. General appearance: alert, cooperative and no distress Resp: clear to auscultation bilaterally Cardio: regular rate and rhythm, S1, S2 normal, no murmur, click, rub or gallop GI: soft, non-tender; bowel sounds normal; no masses,  no organomegaly Extremities: extremities normal, atraumatic, no cyanosis or edema Incision: C/D/I Disposition: Discharge disposition: 01-Home or Self Care       Discharge Instructions    Call MD for:  extreme fatigue   Complete by: As directed    Call MD for:  hives   Complete by: As directed    Call MD for:  persistant dizziness or light-headedness   Complete by: As directed    Call MD for:  persistant nausea and vomiting   Complete by:  As directed    Call MD for:  redness, tenderness, or signs of infection (pain, swelling, redness, odor or green/yellow discharge around incision site)   Complete by: As directed    Call MD for:  severe uncontrolled pain   Complete by: As directed    Call MD for:  temperature >100.4   Complete by: As directed    Diet general   Complete by: As directed    Discharge wound care:   Complete by: As directed    Keep port sites clean and dry.   Driving Restrictions   Complete by: As directed    No driving for 2 weeks   Increase activity slowly   Complete by: As directed    Lifting restrictions   Complete by: As directed    NO heavy lifting for 6 weeks.   Sexual Activity Restrictions   Complete by: As directed    NOTHING in vagina for 8 weeks     Allergies as of 05/14/2019      Reactions   Peanut-containing Drug Products    Facial swelling   Other    Tree nuts   Sulfonamide Derivatives Nausea And Vomiting   lightheaded   Latex Rash   Blisters      Medication List    TAKE these medications   Biotin 1000 MCG tablet Take 1,000 mcg by mouth daily.   cetirizine 10 MG tablet Commonly known as: ZYRTEC Take 10 mg by mouth daily as needed for allergies.   cholecalciferol 1000 units tablet Commonly known as: VITAMIN D Take 1 tablet (1,000 Units total) by mouth daily.   EPINEPHrine  0.3 mg/0.3 mL Soaj injection Commonly known as: EpiPen 2-Pak USE AS DIRECTED FOR SEVERE ALLERGIC REACTION What changed:   how much to take  how to take this  when to take this  reasons to take this  additional instructions   HYDROcodone-acetaminophen 5-325 MG tablet Commonly known as: NORCO/VICODIN Take 1 tablet by mouth every 6 (six) hours as needed for moderate pain.   ibuprofen 800 MG tablet Commonly known as: ADVIL Take 1 tablet (800 mg total) by mouth every 8 (eight) hours as needed.   Omega-3 1000 MG Caps Take 1,000 mg by mouth daily.            Discharge Care  Instructions  (From admission, onward)         Start     Ordered   05/13/19 0000  Discharge wound care:    Comments: Keep port sites clean and dry.   05/13/19 [place] In 2 weeks.   Specialty: Obstetrics and Gynecology Contact information: [address] [place] [phone]          Signed: Lavonia Drafts 05/14/2019, 9:55 AM

## 2019-05-15 NOTE — Anesthesia Postprocedure Evaluation (Signed)
Anesthesia Post Note  Patient: Jodi Saunders  Procedure(s) Performed: XI ROBOTIC ASSISTED TOTAL HYSTERECTOMY WITH SALPINGECTOMY, LYSIS OF ADHESION (Bilateral ) CYSTOSCOPY (Bilateral Ureter)     Patient location during evaluation: PACU Anesthesia Type: General Level of consciousness: sedated and patient cooperative Pain management: pain level controlled Vital Signs Assessment: post-procedure vital signs reviewed and stable Respiratory status: spontaneous breathing Cardiovascular status: stable Anesthetic complications: no    Last Vitals:  Vitals:   05/14/19 0606 05/14/19 0839  BP: (!) 142/82 (!) 159/84  Pulse: 74 72  Resp: 18 18  Temp: 36.9 C 36.8 C  SpO2: 99% 96%    Last Pain:  Vitals:   05/14/19 0839  TempSrc:   PainSc: 2                  Nolon Nations

## 2019-05-22 ENCOUNTER — Telehealth: Payer: Self-pay | Admitting: Family Medicine

## 2019-05-22 ENCOUNTER — Encounter: Payer: Self-pay | Admitting: Family Medicine

## 2019-05-22 NOTE — Telephone Encounter (Signed)
Called patient and clarified that this was her blood transfusion consent form that was scanned.  It does not appear by notes that she received a blood transfusion.  Encouraged her to reach out Dr. Ihor Dow if further questions or concerns regarding blood consent.  Dorris Singh, MD  Family Medicine Teaching Service

## 2019-06-02 ENCOUNTER — Encounter: Payer: Self-pay | Admitting: Obstetrics & Gynecology

## 2019-06-02 ENCOUNTER — Other Ambulatory Visit: Payer: Self-pay

## 2019-06-02 ENCOUNTER — Ambulatory Visit (INDEPENDENT_AMBULATORY_CARE_PROVIDER_SITE_OTHER): Payer: BC Managed Care – PPO | Admitting: Obstetrics & Gynecology

## 2019-06-02 VITALS — BP 152/92 | HR 87 | Ht 64.0 in | Wt 269.1 lb

## 2019-06-02 DIAGNOSIS — Z9889 Other specified postprocedural states: Secondary | ICD-10-CM

## 2019-06-02 NOTE — Progress Notes (Signed)
History:  48 y.o. [id number] here today for 2 weeks post op check. Pt is s/p RATH with bilateral salpingectomy on 05/13/2019. Pt denies problems. She was spotting initially but, this has resolved. She took very few of the narcotics. The last pain meds she took was prior to the Thanksgiving holiday.      The following portions of the patient's history were reviewed and updated as appropriate: allergies, current medications, past family history, past medical history, past social history, past surgical history and problem list.  Review of Systems:  Pertinent items are noted in HPI.    Objective:  Physical Exam Blood pressure (!) 152/92, pulse 87, height 5\' 4"  (1.626 m), weight 269 lb 1.3 oz (122.1 kg), last menstrual period 04/25/2019.  CONSTITUTIONAL: Well-developed, well-nourished female in no acute distress.  HENT:  Normocephalic, atraumatic EYES: Conjunctivae and EOM are normal. No scleral icterus.  NECK: Normal range of motion SKIN: Skin is warm and dry. No rash noted. Not diaphoretic.No pallor. Lane: Alert and oriented to person, place, and time. Normal coordination.  Abd: Soft, nontender and nondistended; port sites all healing well.  Pelvic: deferred  Labs and Imaging 05/13/2019 FINAL MICROSCOPIC DIAGNOSIS:   A. UTERUS, CERVIX AND BILATERAL FALLOPIAN TUBES; HYSTERECTOMY AND  BILATERAL SALPINGECTOMY:  Cervix:  - Mild chronic cervicitis. Nabothian cysts.   Uterus:  - Endometrium: Secretory endometrium.  - Myometrium: Adenomyosis. Leiomyomata.  - Serosa: Adhesions.   Adnexa:  - Fallopian tubes: No significant histopathologic findings.   Assessment & Plan:  2 weeks Post op check following RATH.   Reviewed surg path   Gradual increase in activities  F/u in 4 weeks for recheck and evaluation of vaginal cuff.   Nothing in vagina   Danzel Marszalek L. Harraway-Smith, M.D., [facility]

## 2019-06-02 NOTE — Patient Instructions (Signed)

## 2019-06-30 ENCOUNTER — Encounter: Payer: Self-pay | Admitting: Obstetrics & Gynecology

## 2019-06-30 ENCOUNTER — Other Ambulatory Visit: Payer: Self-pay

## 2019-06-30 ENCOUNTER — Ambulatory Visit (INDEPENDENT_AMBULATORY_CARE_PROVIDER_SITE_OTHER): Payer: BC Managed Care – PPO | Admitting: Obstetrics & Gynecology

## 2019-06-30 VITALS — BP 147/88 | HR 78 | Ht 64.0 in | Wt 267.1 lb

## 2019-06-30 DIAGNOSIS — Z9889 Other specified postprocedural states: Secondary | ICD-10-CM

## 2019-06-30 NOTE — Progress Notes (Signed)
History:  49 y.o. [id number] here today for 7 week post op visit. Pt is s/p RATH with bilateral salpingectomy on 05/13/2019. Pt reports no problems. She denies bleeding or abnormal discharge. She denies pain and reports no difficulty with passage of stool or urine.    The following portions of the patient's history were reviewed and updated as appropriate: allergies, current medications, past family history, past medical history, past social history, past surgical history and problem list.  Review of Systems:  Pertinent items are noted in HPI.    Objective:  Physical Exam Blood pressure (!) 147/88, pulse 78, height 5\' 4"  (1.626 m), weight 267 lb 1.1 oz (121.1 kg), last menstrual period 04/25/2019.  CONSTITUTIONAL: Well-developed, well-nourished female in no acute distress.  HENT:  Normocephalic, atraumatic EYES: Conjunctivae and EOM are normal. No scleral icterus.  NECK: Normal range of motion SKIN: Skin is warm and dry. No rash noted. Not diaphoretic.No pallor. [place]: Alert and oriented to person, place, and time. Normal coordination.  Abd: Soft, nontender and nondistended; port sites well healed.   Pelvic: Normal appearing external genitalia; normal appearing vaginal mucosa and cervix.  Normal discharge.  The vaignal cuff is well healed. There are palpable masses adnexal tenderness  Labs and Imaging 05/13/2019  FINAL MICROSCOPIC DIAGNOSIS:   A. UTERUS, CERVIX AND BILATERAL FALLOPIAN TUBES; HYSTERECTOMY AND  BILATERAL SALPINGECTOMY:  Cervix:  - Mild chronic cervicitis. Nabothian cysts.   Uterus:  - Endometrium: Secretory endometrium.  - Myometrium: Adenomyosis. Leiomyomata.  - Serosa: Adhesions.   Adnexa:  - Fallopian tubes: No significant histopathologic findings.   Assessment & Plan:  7 week post op from [facility] with bilateral salpingectomy. Pt is doing well with no issues.   Gradual return to fill activities  May resume sexual activity after 1 week  F/u in 3 months if  issues  If no issues pt to f/u in 1 year with Dr. Elly Modena.   Jennae Hakeem L. Harraway-Smith, M.D., [place] June

## 2019-06-30 NOTE — Patient Instructions (Signed)

## 2019-07-20 ENCOUNTER — Encounter (HOSPITAL_BASED_OUTPATIENT_CLINIC_OR_DEPARTMENT_OTHER): Payer: Self-pay | Admitting: Emergency Medicine

## 2019-07-20 ENCOUNTER — Emergency Department (HOSPITAL_BASED_OUTPATIENT_CLINIC_OR_DEPARTMENT_OTHER)
Admission: EM | Admit: 2019-07-20 | Discharge: 2019-07-20 | Disposition: A | Discharge status: Home / Self Care | Payer: BC Managed Care – PPO | Attending: Emergency Medicine | Admitting: Emergency Medicine

## 2019-07-20 ENCOUNTER — Emergency Department (HOSPITAL_BASED_OUTPATIENT_CLINIC_OR_DEPARTMENT_OTHER): Payer: BC Managed Care – PPO

## 2019-07-20 ENCOUNTER — Other Ambulatory Visit: Payer: Self-pay

## 2019-07-20 DIAGNOSIS — Z91010 Allergy to peanuts: Secondary | ICD-10-CM | POA: Diagnosis not present

## 2019-07-20 DIAGNOSIS — M25512 Pain in left shoulder: Secondary | ICD-10-CM | POA: Insufficient documentation

## 2019-07-20 DIAGNOSIS — Z882 Allergy status to sulfonamides status: Secondary | ICD-10-CM | POA: Diagnosis not present

## 2019-07-20 DIAGNOSIS — M79602 Pain in left arm: Secondary | ICD-10-CM | POA: Insufficient documentation

## 2019-07-20 DIAGNOSIS — E785 Hyperlipidemia, unspecified: Secondary | ICD-10-CM | POA: Insufficient documentation

## 2019-07-20 DIAGNOSIS — Z91040 Latex allergy status: Secondary | ICD-10-CM | POA: Insufficient documentation

## 2019-07-20 MED ORDER — NAPROXEN 500 MG PO TABS: 500.0000 mg | ORAL_TABLET | Freq: Two times a day (BID) | ORAL | 0 refills | Status: DC

## 2019-07-20 MED ORDER — IBUPROFEN 800 MG PO TABS
800.0000 mg | ORAL_TABLET | Freq: Once | ORAL | Status: AC
  Administered 2019-07-20: 800 mg via ORAL
  Filled 2019-07-20: qty 1

## 2019-07-20 NOTE — ED Provider Notes (Signed)
[place] EMERGENCY DEPARTMENT Provider Note   CSN: [id number] Arrival date & time: 07/20/19  0846     History Chief Complaint  Patient presents with  . Arm Pain    Jodi Saunders is a 49 y.o. female with a past medical history significant for seasonal allergies and anemia who presents to the ED due to gradual onset, worsening, intermittent, left shoulder and left elbow pain x 2 weeks. Patient denies direct injury. Patient describes her pain a burning sensation to her elbow and jabbing sensation to her shoulder. She rates her pain a 7/10, worse with movement especially extension of the left arm. Patient notes that her pain worsens throughout the day. She tried ibuprofen last night with moderate relief. Patient denies fever and chills. Patient denies associative erythema, edema, and warmth. Denies numbness/tingling. Patient denies neck pain and neck injury.     Past Medical History:  Diagnosis Date  . Abnormal uterine bleeding 04/21/2013  . Allergy   . Anemia   . Elevated blood pressure reading    previously  prescribed medication never took it, now diet controlled (lost 30 pounds)   . Multiple allergies     Patient Active Problem List   Diagnosis Date Noted  . Post-operative state 05/13/2019  . Pelvic peritoneal adhesions, female 05/13/2019  . Endometriosis determined by laparoscopy 05/13/2019  . Fibroids 05/13/2019  . Pelvic pain in female 05/06/2019  . Hyperplastic polyp of ascending colon 12/02/2018  . Hyperlipidemia 03/19/2018  . Severe dysmenorrhea 10/11/2011  . Congenital pes planus 11/19/2007  . OBESITY, NOS 08/16/2006  . Anemia 08/16/2006    Past Surgical History:  Procedure Laterality Date  . APPENDECTOMY    . BREAST BIOPSY Left 08/2016  . CHOLECYSTECTOMY  2011  . CYSTOSCOPY Bilateral 05/13/2019   Procedure: CYSTOSCOPY;  Surgeon: Lavonia Drafts, MD;  Location: [facility];  Service: Gynecology;  Laterality: Bilateral;  .  HYSTEROSCOPY WITH NOVASURE  06/03/2012   Procedure: HYSTEROSCOPY WITH NOVASURE;  Surgeon: [name], MD;  Location: [facility];  Service: Gynecology;  Laterality: N/A;  . LAPAROSCOPIC APPENDECTOMY N/A 12/14/2015   Procedure: APPENDECTOMY LAPAROSCOPIC CONVERTED TO OPEN;  Surgeon: Autumn Messing III, MD;  Location: [facility];  Service: General;  Laterality: N/A;  . MULTIPLE TOOTH EXTRACTIONS    . [place]  2002  . ROBOTIC ASSISTED TOTAL HYSTERECTOMY Bilateral 05/13/2019   Procedure: XI ROBOTIC ASSISTED TOTAL HYSTERECTOMY WITH SALPINGECTOMY, LYSIS OF ADHESION;  Surgeon: Lavonia Drafts, MD;  Location: [facility];  Service: Gynecology;  Laterality: Bilateral;  . TONSILLECTOMY  2008  . VAGINAL DELIVERY  1992  . WISDOM TOOTH EXTRACTION       OB History    Gravida  3   Para  1   Term  1   Preterm      AB  2   Living  1     SAB      TAB  2   Ectopic      Multiple      Live Births  1           Family History  Problem Relation Age of Onset  . Diabetes Father   . Hypertension Father   . Hypertension Mother   . Renal cancer Brother   . Diabetes Paternal Grandmother   . Hypertension Paternal Grandmother   . Diabetes Paternal Grandfather   . Hypertension Paternal Grandfather   . Allergic rhinitis Neg Hx   . Angioedema Neg  Hx   . Asthma Neg Hx   . Atopy Neg Hx   . Eczema Neg Hx   . Immunodeficiency Neg Hx   . Urticaria Neg Hx     Social History   Tobacco Use  . Smoking status: Never Smoker  . Smokeless tobacco: Never Used  Substance Use Topics  . Alcohol use: Not Currently    Comment: occasionally  . Drug use: No    Home Medications Prior to Admission medications   Medication Sig Start Date End Date Taking? Authorizing Provider  Biotin 1000 MCG tablet Take 1,000 mcg by mouth daily.     [provider]  cetirizine (ZYRTEC) 10 MG tablet Take 10 mg by mouth daily as needed for allergies.     [provider]   cholecalciferol (VITAMIN D) 1000 units tablet Take 1 tablet (1,000 Units total) by mouth daily. 10/02/17   Carlyle Dolly, MD  EPINEPHrine (EPIPEN 2-PAK) 0.3 mg/0.3 mL IJ SOAJ injection USE AS DIRECTED FOR SEVERE ALLERGIC REACTION Patient taking differently: Inject 0.3 mg into the muscle as needed for anaphylaxis.  11/20/17   Carlyle Dolly, MD  HYDROcodone-acetaminophen (NORCO/VICODIN) 5-325 MG tablet Take 1 tablet by mouth every 6 (six) hours as needed for moderate pain. Patient not taking: Reported on 06/02/2019 05/13/19   Lavonia Drafts, MD  ibuprofen (ADVIL) 800 MG tablet Take 1 tablet (800 mg total) by mouth every 8 (eight) hours as needed. Patient not taking: Reported on 06/02/2019 05/13/19   Lavonia Drafts, MD  naproxen (NAPROSYN) 500 MG tablet Take 1 tablet (500 mg total) by mouth 2 (two) times daily. 07/20/19   Suzy Bouchard, PA-C  Omega-3 1000 MG CAPS Take 1,000 mg by mouth daily.     [provider]    Allergies    Peanut-containing drug products, Other, Sulfonamide derivatives, and Latex  Review of Systems   Review of Systems  Constitutional: Negative for chills and fever.  Musculoskeletal: Positive for arthralgias (left shoulder/ left elbow). Negative for back pain, joint swelling, neck pain and neck stiffness.  Neurological: Negative for weakness and numbness.    Physical Exam Updated Vital Signs BP (!) 162/91 (BP Location: Right Arm)   Pulse 83   Temp 98 F (36.7 C) (Oral)   Resp 16   Ht 5\' 4"  (1.626 m)   Wt 121.6 kg   LMP 04/25/2019   SpO2 99%   BMI 46.00 kg/m   Physical Exam Vitals and nursing note reviewed.  Constitutional:      General: She is not in acute distress.    Appearance: She is not ill-appearing.  HENT:     Head: Normocephalic.  Eyes:     Conjunctiva/sclera: Conjunctivae normal.  Neck:     Comments: No cervical midline tenderness. Full ROM of neck.  Cardiovascular:     Rate and Rhythm: Normal rate  and regular rhythm.     Pulses: Normal pulses.     Heart sounds: Normal heart sounds. No murmur. No friction rub. No gallop.   Pulmonary:     Effort: Pulmonary effort is normal.     Breath sounds: Normal breath sounds.  Abdominal:     General: Abdomen is flat. There is no distension.     Palpations: Abdomen is soft.     Tenderness: There is no abdominal tenderness. There is no guarding or rebound.  Musculoskeletal:     Cervical back: Neck supple.     Comments: Left upper extremity: No tenderness to palpation of  shoulder. Full ROM of shoulder. No erythema, edema, or warmth. Tenderness to palpation over bilateral epicondyles and proximal radius/ulna. Full ROM of elbow. No erythema, edema, or warmth. Radial pulse intact. Full ROM of wrist and all fingers. Soft compartments. Sensation intact.  Skin:    General: Skin is warm and dry.  Neurological:     General: No focal deficit present.     Mental Status: She is alert.     ED Results / Procedures / Treatments   Labs (all labs ordered are listed, but only abnormal results are displayed) Labs Reviewed - No data to display  EKG None  Radiology DG Elbow Complete Left  Result Date: 07/20/2019 CLINICAL DATA:  Left elbow pain for the past 2 weeks. EXAM: LEFT ELBOW - COMPLETE 3+ VIEW COMPARISON:  None. FINDINGS: No fracture or elbow joint effusion. Joint spaces are preserved. Regional soft tissues appear normal. IMPRESSION: No explanation for patient's left elbow pain. Electronically Signed   By: Sandi Mariscal M.D.   On: 07/20/2019 09:55   DG Shoulder Left  Result Date: 07/20/2019 CLINICAL DATA:  Left shoulder pain for the past 2 weeks. EXAM: LEFT SHOULDER - 2+ VIEW COMPARISON:  None. FINDINGS: No fracture or dislocation. Glenohumeral and acromioclavicular joint spaces are preserved. No evidence of calcific tendinitis. Limited visualization of the adjacent thorax is normal. Regional soft tissues appear normal. IMPRESSION: No explanation for  patient's left shoulder pain. Electronically Signed   By: Sandi Mariscal M.D.   On: 07/20/2019 09:56    Procedures Procedures (including critical care time)  Medications Ordered in ED Medications  ibuprofen (ADVIL) tablet 800 mg (800 mg Oral Given 07/20/19 [id number])    ED Course  I have reviewed the triage vital signs and the nursing notes.  Pertinent labs & imaging results that were available during my care of the patient were reviewed by me and considered in my medical decision making (see chart for details).    MDM Rules/Calculators/A&P                     49 year old female presents to the ED due to left shoulder and left elbow pain x 2 weeks. Denies direct injury. Patient is afebrile with stable vitals. Patient in no acute distress and non-ill appearing. Physical exam reassuring. No tenderness to palpation of left shoulder with full ROM. No erythema, edema, or warmth. Doubt septic arthritis. Tenderness to palpation over left bilateral epicondyles and proximal radius/ulna with full ROM of elbow. No erythema, edema, or warmth. Doubt septic arthritis. Radial pulse intact. Full ROM of wrist and all fingers. Soft compartments. No cervical midline tenderness. Full ROM of neck. Suspect pain related to cervical radiculopathy vs. Nerve impingement from shoulder/elbow vs. arthritis. Doubt bony fractures, but will obtain x-rays to evaluate joint space. Pain treated here in the ED.  X-rays personally reviewed which are negative for acute abnormalities. Will discharge patient with naproxen. Patient advised to follow-up with PCP if symptoms do not improve within the next week. Strict ED precautions discussed with patient. Patient states understanding and agrees to plan. Patient discharged home in no acute distress and stable vitals Final Clinical Impression(s) / ED Diagnoses Final diagnoses:  Left arm pain    Rx / DC Orders ED Discharge Orders         Ordered    naproxen (NAPROSYN) 500 MG tablet  2  times daily     07/20/19 1006  Karie Kirks 07/20/19 1012    Blanchie Dessert, MD 07/20/19 1433

## 2019-07-20 NOTE — Discharge Instructions (Signed)
As discussed, your x-rays were normal today. I am sending you home with naproxen for pain management. You can take it twice a day as needed for pain. If your symptoms do not improve within the next week, follow-up with your PCP for further evaluation. You may purchase over the counter Voltaren gel and Lidoderm patches for pain as well. Return to the ER for new or worsening symptoms.

## 2019-07-20 NOTE — ED Triage Notes (Signed)
L arm soreness for a few weeks with burning pain to the elbow.

## 2019-09-04 ENCOUNTER — Ambulatory Visit: Payer: BC Managed Care – PPO | Attending: Internal Medicine

## 2019-09-04 DIAGNOSIS — Z23 Encounter for immunization: Secondary | ICD-10-CM

## 2019-09-04 NOTE — Progress Notes (Signed)
   Covid-19 Vaccination Clinic  Name:  Jodi Saunders    MRN: [id number] DOB: 04-Apr-1971  09/04/2019  Jodi Saunders was observed post Covid-19 immunization for 15 minutes without incident. She was provided with Vaccine Information Sheet and instruction to access the V-Safe system.   Jodi Saunders was instructed to call 911 with any severe reactions post vaccine: Marland Kitchen Difficulty breathing  . Swelling of face and throat  . A fast heartbeat  . A bad rash all over body  . Dizziness and weakness   Immunizations Administered    Name Date Dose VIS Date Route   Moderna COVID-19 Vaccine 09/04/2019 10:12 AM 0.5 mL 05/20/2019 Intramuscular   Manufacturer: Moderna   Lot: [id number]   [id number]

## 2019-09-22 ENCOUNTER — Ambulatory Visit: Payer: BC Managed Care – PPO | Admitting: Family Medicine

## 2019-09-22 ENCOUNTER — Other Ambulatory Visit: Payer: Self-pay

## 2019-09-22 ENCOUNTER — Encounter: Payer: Self-pay | Admitting: Family Medicine

## 2019-09-22 VITALS — BP 126/80 | HR 78 | Ht 64.0 in | Wt 252.0 lb

## 2019-09-22 DIAGNOSIS — E66813 Obesity, class 3: Secondary | ICD-10-CM

## 2019-09-22 DIAGNOSIS — E785 Hyperlipidemia, unspecified: Secondary | ICD-10-CM | POA: Diagnosis not present

## 2019-09-22 DIAGNOSIS — Z0000 Encounter for general adult medical examination without abnormal findings: Secondary | ICD-10-CM

## 2019-09-22 DIAGNOSIS — D649 Anemia, unspecified: Secondary | ICD-10-CM

## 2019-09-22 DIAGNOSIS — Z1159 Encounter for screening for other viral diseases: Secondary | ICD-10-CM | POA: Diagnosis not present

## 2019-09-22 DIAGNOSIS — Z6841 Body Mass Index (BMI) 40.0 and over, adult: Secondary | ICD-10-CM

## 2019-09-22 NOTE — Progress Notes (Signed)
    SUBJECTIVE:   CHIEF COMPLAINT / HPI:   Jodi Saunders is a pleasant 49 year old with history significant for obesity presenting today for annual exam.   The patient reports she has lost 17 pounds.  She has been doing intermittent fasting and also doing a keto sort of diet.  She feels overall well.  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  The patient has a strong family history of osteoporosis and is worried about her vitamin D supply.  The patient denies dry skin or undesired weight change.  She does endorse intermittent general fatigue like her thyroid checked today.  She does not exercise regularly.  She is due for a mammogram in July.   She has a history of anemia from menorrhagia and would like this value checked today. Since last check, she has had a hysterectomy.   PERTINENT  PMH / PSH/Family/Social History :  Obesity  Elevated BP readings- resolved History of TAH  OBJECTIVE:   BP 126/80   Pulse 78   Ht 5\' 4"  (1.626 m)   Wt 252 lb (114.3 kg)   LMP 04/25/2019   SpO2 98%   BMI 43.26 kg/m   HEENT: Sclera anicteric. Dentition is moderate. Appears well hydrated. Neck: Supple no thyromegaly  Cardiac: Regular rate and rhythm. Normal S1/S2. No murmurs, rubs, or gallops appreciated. Lungs: Clear bilaterally to ascultation.  Abdomen: Normoactive bowel sounds. No tenderness to deep or light palpation. No rebound or guarding.  Extremities: Warm, well perfused without edema.  Skin: Warm, dry Psych: Pleasant and appropriate     ASSESSMENT/PLAN:   Annual Examination We discussed healthy eating habits, moderate physical activity for 30 minutes 5 times per week (or 150 minutes), safe sex practices, avoiding tobacco products, safe alcohol consumption, and safe driving habits.    Orders Placed This Encounter  Procedures  . Hepatitis c antibody (reflex)  . Vitamin D, 25-hydroxy  . CBC  . Basic Metabolic Panel  . TSH  . Hemoglobin A1c  . Lipid Panel     Dorris Singh, [place]

## 2019-09-22 NOTE — Patient Instructions (Signed)
It was wonderful to see you today.  Please bring ALL of your medications with you to every visit.   Thank you for choosing [facility].   Please call [phone] with any questions about today's appointment.  Please be sure to schedule follow up at the front  desk before you leave today.   Dorris Singh, MD  Family Medicine    Great work with the lifestyle changes  Try to attain 150 minutes of exercise a week  Follow up in 1 year  Your mammogram is due in July--I set a reminder for myself.

## 2019-09-23 LAB — BASIC METABOLIC PANEL
BUN/Creatinine Ratio: 10 (ref 9–23)
BUN: 7 mg/dL (ref 6–24)
CO2: 23 mmol/L (ref 20–29)
Calcium: 9.3 mg/dL (ref 8.7–10.2)
Chloride: 104 mmol/L (ref 96–106)
Creatinine, Ser: 0.69 mg/dL (ref 0.57–1.00)
GFR calc Af Amer: 119 mL/min/{1.73_m2} (ref >59)
GFR calc non Af Amer: 103 mL/min/{1.73_m2} (ref >59)
Glucose: 92 mg/dL (ref 65–99)
Potassium: 4.1 mmol/L (ref 3.5–5.2)
Sodium: 140 mmol/L (ref 134–144)

## 2019-09-23 LAB — CBC
Hematocrit: 34.2 % (ref 34.0–46.6)
Hemoglobin: 10.4 g/dL — ABNORMAL LOW (ref 11.1–15.9)
MCH: 22 pg — ABNORMAL LOW (ref 26.6–33.0)
MCHC: 30.4 g/dL — ABNORMAL LOW (ref 31.5–35.7)
MCV: 72 fL — ABNORMAL LOW (ref 79–97)
Platelets: 349 10*3/uL (ref 150–450)
RBC: 4.73 x10E6/uL (ref 3.77–5.28)
RDW: 15.7 % — ABNORMAL HIGH (ref 11.7–15.4)
WBC: 6.8 10*3/uL (ref 3.4–10.8)

## 2019-09-23 LAB — HEMOGLOBIN A1C
Est. average glucose Bld gHb Est-mCnc: 117 mg/dL
Hgb A1c MFr Bld: 5.7 % — ABNORMAL HIGH (ref 4.8–5.6)

## 2019-09-23 LAB — LIPID PANEL
Chol/HDL Ratio: 3.7 ratio (ref 0.0–4.4)
Cholesterol, Total: 176 mg/dL (ref 100–199)
HDL: 47 mg/dL (ref >39)
LDL Chol Calc (NIH): 112 mg/dL — ABNORMAL HIGH (ref 0–99)
Triglycerides: 91 mg/dL (ref 0–149)
VLDL Cholesterol Cal: 17 mg/dL (ref 5–40)

## 2019-09-23 LAB — HCV COMMENT:

## 2019-09-23 LAB — TSH: TSH: 1.63 u[IU]/mL (ref 0.450–4.500)

## 2019-09-23 LAB — HEPATITIS C ANTIBODY (REFLEX): HCV Ab: <0.1 s/co ratio (ref 0.0–0.9)

## 2019-09-23 LAB — VITAMIN D 25 HYDROXY (VIT D DEFICIENCY, FRACTURES): Vit D, 25-Hydroxy: 29.5 ng/mL — ABNORMAL LOW (ref 30.0–100.0)

## 2019-09-24 ENCOUNTER — Encounter: Payer: Self-pay | Admitting: Family Medicine

## 2019-09-25 ENCOUNTER — Encounter: Payer: Self-pay | Admitting: Family Medicine

## 2019-09-25 DIAGNOSIS — Z91010 Allergy to peanuts: Secondary | ICD-10-CM

## 2019-09-25 MED ORDER — EPINEPHRINE 0.3 MG/0.3ML IJ SOAJ: 0.3000 mg | INTRAMUSCULAR | 1 refills | Status: DC | PRN

## 2019-10-07 ENCOUNTER — Ambulatory Visit: Payer: BC Managed Care – PPO | Attending: Family

## 2019-10-07 DIAGNOSIS — Z23 Encounter for immunization: Secondary | ICD-10-CM

## 2019-10-07 NOTE — Progress Notes (Signed)
   Covid-19 Vaccination Clinic  Name:  Jodi Saunders    MRN: [id number] DOB: 12-06-1970  10/07/2019  Ms. Jodi Saunders was observed post Covid-19 immunization for 15 minutes without incident. She was provided with Vaccine Information Sheet and instruction to access the V-Safe system.   Ms. Jodi Saunders was instructed to call 911 with any severe reactions post vaccine: Marland Kitchen Difficulty breathing  . Swelling of face and throat  . A fast heartbeat  . A bad rash all over body  . Dizziness and weakness   Immunizations Administered    Name Date Dose VIS Date Route   Moderna COVID-19 Vaccine 10/07/2019  1:17 PM 0.5 mL 05/2019 Intramuscular   Manufacturer: Moderna   Lot: [id number]   [id number]

## 2019-12-22 ENCOUNTER — Encounter: Payer: Self-pay | Admitting: Family Medicine

## 2020-07-14 ENCOUNTER — Ambulatory Visit: Payer: BC Managed Care – PPO | Attending: Family

## 2020-07-14 DIAGNOSIS — Z23 Encounter for immunization: Secondary | ICD-10-CM

## 2020-10-08 ENCOUNTER — Encounter: Payer: Self-pay | Admitting: Family Medicine

## 2020-10-08 ENCOUNTER — Ambulatory Visit (INDEPENDENT_AMBULATORY_CARE_PROVIDER_SITE_OTHER): Payer: BC Managed Care – PPO | Admitting: Family Medicine

## 2020-10-08 ENCOUNTER — Other Ambulatory Visit: Payer: Self-pay

## 2020-10-08 VITALS — BP 101/80 | HR 97 | Temp 98.8°F | Ht 64.0 in | Wt 236.2 lb

## 2020-10-08 DIAGNOSIS — B353 Tinea pedis: Secondary | ICD-10-CM

## 2020-10-08 DIAGNOSIS — L600 Ingrowing nail: Secondary | ICD-10-CM

## 2020-10-08 DIAGNOSIS — Z91010 Allergy to peanuts: Secondary | ICD-10-CM

## 2020-10-08 DIAGNOSIS — R634 Abnormal weight loss: Secondary | ICD-10-CM

## 2020-10-08 DIAGNOSIS — K08109 Complete loss of teeth, unspecified cause, unspecified class: Secondary | ICD-10-CM | POA: Insufficient documentation

## 2020-10-08 DIAGNOSIS — Z0000 Encounter for general adult medical examination without abnormal findings: Secondary | ICD-10-CM

## 2020-10-08 DIAGNOSIS — D509 Iron deficiency anemia, unspecified: Secondary | ICD-10-CM

## 2020-10-08 LAB — POCT GLYCOSYLATED HEMOGLOBIN (HGB A1C): Hemoglobin A1C: 5.2 % (ref 4.0–5.6)

## 2020-10-08 MED ORDER — CICLOPIROX OLAMINE 0.77 % EX CREA: TOPICAL_CREAM | Freq: Two times a day (BID) | CUTANEOUS | 3 refills | Status: DC

## 2020-10-08 MED ORDER — EPINEPHRINE 0.3 MG/0.3ML IJ SOAJ: 0.3000 mg | INTRAMUSCULAR | 2 refills | Status: DC | PRN

## 2020-10-08 NOTE — Assessment & Plan Note (Signed)
Discussed, A1C today.

## 2020-10-08 NOTE — Patient Instructions (Addendum)
It was wonderful to see you today.  Please bring ALL of your medications with you to every visit.   Today we talked about:  --- CONGRATULATIONS on your physical activity and weight loss  -- Going to the foot doctor for your toenail  -- Checking some blood work (listed below)   --You can use the gel I sent to your pharmacy twice a day for a week for your feet  You can also use over the counter 'terbinafine'   I will call you with results  Please obtain your mammogram at your earliest convenience    Thank you for choosing [facility].   Please call [phone] with any questions about today's appointment.  Follow up 1 year   Dorris Singh, MD  Family Medicine

## 2020-10-08 NOTE — Progress Notes (Signed)
    SUBJECTIVE:   Chief compliant/HPI: annual examination  Jodi Saunders is a 50 y.o. who presents today for an annual exam.   She is overall well. Had all of her teeth removed in November. Now is awaiting implants, on entirely liquid diet. This is mostly plant based, lots of vegetables, some protein powder.   She also has ongoing issues with tinea as she calls it--between toes on bilateral feet. Pruritic at night, uses Dial soap. No other similar lesions.   History tabs reviewed and updated yes.   Review of systems form reviewed and notable for none--no chest pain, dyspnea, GI upset, no vaginal bleeding,.   OBJECTIVE:   BP 101/80   Pulse 97   Temp 98.8 F (37.1 C)   Ht 5\' 4"  (1.626 m)   Wt 236 lb 3.2 oz (107.1 kg)   LMP 04/25/2019   SpO2 99%   BMI 40.54 kg/m   Today's Vitals   10/08/20 0843  BP: 101/80  Pulse: 97  Temp: 98.8 F (37.1 C)  SpO2: 99%  Weight: 236 lb 3.2 oz (107.1 kg)  Height: 5\' 4"  (1.626 m)  PainSc: 0-No pain   Body mass index is 40.54 kg/m.  Cardiac: Regular rate and rhythm. Normal S1/S2. No murmurs, rubs, or gallops appreciated. Lungs: Clear bilaterally to ascultation.  Abdomen: Normoactive bowel sounds. No tenderness to deep or light palpation. No rebound or guarding.    Psych: Pleasant and appropriate  Bilateral feet  Between digits 3 and 4 on both sides there is erythematous wet appearing desquamated skin.  ASSESSMENT/PLAN:   Severe obesity (HCC) Discussed, A1C today.   d/p dental extraction awaiting implant  Currently on mostly pureed, smoothie diet Given weight change and dramatic change in dietary intake (less animal protein)  Will check vitamin D (history of insufficiency) and B12 today   Tinea Pedis Recommended OTC terbinafine Rx given in case cost is issue  History of anemia Has beta thalassemia Ferritin and CBC today given change in diet, weight  Chronic ingrown nails Discussed general measures to prevent  Referral to  Podiatry   Annual Examination  See AVS for age appropriate recommendations.   PHQ score 0, reviewed and discussed.  Blood pressure reviewed and at goal .  Asked about intimate partner violence and resources given as appropriate   Considered the following items based upon USPSTF recommendations: Diabetes screening: ordered Screening for elevated cholesterol: ordered  Cervical cancer screening: not indicated given history of hysterectomy with prior normal cytology.  Breast cancer screening: discussed potential benefits, risks including overdiagnosis and biopsy, elected proceed with mammogram Colorectal cancer screening: up to date on screening for CRC. if age 63 or over.   Follow up in 1  year or sooner if indicated.    Martyn Malay, MD [place]

## 2020-10-08 NOTE — Assessment & Plan Note (Signed)
Currently on mostly pureed, smoothie diet Given weight change and dramatic change in dietary intake (less animal protein)  Will check vitamin D (history of insufficiency) and B12 today

## 2020-10-09 LAB — LIPID PANEL
Chol/HDL Ratio: 3.1 ratio (ref 0.0–4.4)
Cholesterol, Total: 181 mg/dL (ref 100–199)
HDL: 58 mg/dL (ref >39)
LDL Chol Calc (NIH): 102 mg/dL — ABNORMAL HIGH (ref 0–99)
Triglycerides: 116 mg/dL (ref 0–149)
VLDL Cholesterol Cal: 21 mg/dL (ref 5–40)

## 2020-10-09 LAB — CBC
Hematocrit: 34.6 % (ref 34.0–46.6)
Hemoglobin: 10.7 g/dL — ABNORMAL LOW (ref 11.1–15.9)
MCH: 21.7 pg — ABNORMAL LOW (ref 26.6–33.0)
MCHC: 30.9 g/dL — ABNORMAL LOW (ref 31.5–35.7)
MCV: 70 fL — ABNORMAL LOW (ref 79–97)
Platelets: 367 10*3/uL (ref 150–450)
RBC: 4.92 x10E6/uL (ref 3.77–5.28)
RDW: 16.2 % — ABNORMAL HIGH (ref 11.7–15.4)
WBC: 8.1 10*3/uL (ref 3.4–10.8)

## 2020-10-09 LAB — VITAMIN B12: Vitamin B-12: 286 pg/mL (ref 232–1245)

## 2020-10-09 LAB — VITAMIN D 25 HYDROXY (VIT D DEFICIENCY, FRACTURES): Vit D, 25-Hydroxy: 27.8 ng/mL — ABNORMAL LOW (ref 30.0–100.0)

## 2020-10-09 LAB — FERRITIN: Ferritin: 123 ng/mL (ref 15–150)

## 2020-10-09 LAB — TSH: TSH: 1.63 u[IU]/mL (ref 0.450–4.500)

## 2020-10-12 ENCOUNTER — Telehealth: Payer: Self-pay | Admitting: Family Medicine

## 2020-10-12 NOTE — Telephone Encounter (Signed)
Called with results. Discussed increasing vitamin D rich foods.  Dorris Singh, MD  Family Medicine Teaching Service

## 2020-11-04 NOTE — Progress Notes (Signed)
   Covid-19 Vaccination Clinic  Name:  Jodi Saunders    MRN: [id number] DOB: 03-09-1971  11/04/2020  Jodi Saunders was observed post Covid-19 immunization for 15 minutes without incident. She was provided with Vaccine Information Sheet and instruction to access the V-Safe system.   Jodi Saunders was instructed to call 911 with any severe reactions post vaccine: Marland Kitchen Difficulty breathing  . Swelling of face and throat  . A fast heartbeat  . A bad rash all over body  . Dizziness and weakness   Immunizations Administered    Name Date Dose VIS Date Route   Moderna Covid-19 Booster Vaccine 07/14/2020 12:15 PM 0.25 mL 04/07/2020 Intramuscular   Manufacturer: Moderna   Lot: [id number]   [place]: [id number]

## 2020-12-11 ENCOUNTER — Ambulatory Visit
Admission: RE | Admit: 2020-12-11 | Discharge: 2020-12-11 | Disposition: A | Discharge status: Home / Self Care | Payer: BC Managed Care – PPO | Source: Ambulatory Visit | Attending: Family Medicine | Admitting: Family Medicine

## 2020-12-11 ENCOUNTER — Other Ambulatory Visit: Payer: Self-pay

## 2020-12-11 DIAGNOSIS — Z0000 Encounter for general adult medical examination without abnormal findings: Secondary | ICD-10-CM

## 2020-12-13 ENCOUNTER — Encounter: Payer: Self-pay | Admitting: Family Medicine

## 2021-03-15 ENCOUNTER — Emergency Department (HOSPITAL_BASED_OUTPATIENT_CLINIC_OR_DEPARTMENT_OTHER): Payer: BC Managed Care – PPO

## 2021-03-15 ENCOUNTER — Encounter (HOSPITAL_BASED_OUTPATIENT_CLINIC_OR_DEPARTMENT_OTHER): Payer: Self-pay

## 2021-03-15 ENCOUNTER — Other Ambulatory Visit: Payer: Self-pay

## 2021-03-15 ENCOUNTER — Emergency Department (HOSPITAL_BASED_OUTPATIENT_CLINIC_OR_DEPARTMENT_OTHER)
Admission: EM | Admit: 2021-03-15 | Discharge: 2021-03-15 | Disposition: A | Discharge status: Home / Self Care | Payer: BC Managed Care – PPO | Attending: Emergency Medicine | Admitting: Emergency Medicine

## 2021-03-15 DIAGNOSIS — M79604 Pain in right leg: Secondary | ICD-10-CM | POA: Diagnosis not present

## 2021-03-15 DIAGNOSIS — Z91040 Latex allergy status: Secondary | ICD-10-CM | POA: Diagnosis not present

## 2021-03-15 DIAGNOSIS — R079 Chest pain, unspecified: Secondary | ICD-10-CM | POA: Diagnosis present

## 2021-03-15 DIAGNOSIS — R600 Localized edema: Secondary | ICD-10-CM | POA: Insufficient documentation

## 2021-03-15 LAB — BASIC METABOLIC PANEL
Anion gap: 7 (ref 5–15)
BUN: 14 mg/dL (ref 6–20)
CO2: 28 mmol/L (ref 22–32)
Calcium: 9 mg/dL (ref 8.9–10.3)
Chloride: 101 mmol/L (ref 98–111)
Creatinine, Ser: 0.67 mg/dL (ref 0.44–1.00)
GFR, Estimated: >60 mL/min (ref >60)
Glucose, Bld: 113 mg/dL — ABNORMAL HIGH (ref 70–99)
Potassium: 3.9 mmol/L (ref 3.5–5.1)
Sodium: 136 mmol/L (ref 135–145)

## 2021-03-15 LAB — CBC
HCT: 31.5 % — ABNORMAL LOW (ref 36.0–46.0)
Hemoglobin: 10.1 g/dL — ABNORMAL LOW (ref 12.0–15.0)
MCH: 22.4 pg — ABNORMAL LOW (ref 26.0–34.0)
MCHC: 32.1 g/dL (ref 30.0–36.0)
MCV: 70 fL — ABNORMAL LOW (ref 80.0–100.0)
Platelets: 340 10*3/uL (ref 150–400)
RBC: 4.5 MIL/uL (ref 3.87–5.11)
RDW: 15 % (ref 11.5–15.5)
WBC: 9.3 10*3/uL (ref 4.0–10.5)
nRBC: 0 % (ref 0.0–0.2)

## 2021-03-15 LAB — D-DIMER, QUANTITATIVE: D-Dimer, Quant: <0.27 ug/mL-FEU (ref 0.00–0.50)

## 2021-03-15 LAB — TROPONIN I (HIGH SENSITIVITY)
Troponin I (High Sensitivity): 3 ng/L (ref <18)
Troponin I (High Sensitivity): 3 ng/L (ref <18)

## 2021-03-15 NOTE — ED Provider Notes (Signed)
[facility] EMERGENCY DEPARTMENT Provider Note   CSN: [id number] Arrival date & time: 03/15/21  1651     History Chief Complaint  Patient presents with   Chest Pain    Jodi Saunders is a 50 y.o. female.   Chest Pain Associated symptoms: no abdominal pain, no back pain and no weakness   Patient presents with chest pain.  Dull in the chest.  Not worse with movement.  Does not feel short of breath.  Also states she has pain in the right lower leg.  That began yesterday.  Chest pain began today.  States she had been driving to [place] and back.  No fevers or chills.  No cough.  No history of coronary artery disease.  No unilateral swelling that right leg but states it feels pain there.  No trauma.  States she does have some swelling in both her legs.  No hemoptysis.  No cough.    Past Medical History:  Diagnosis Date   Abnormal uterine bleeding 04/21/2013   Allergy    Anemia    Elevated blood pressure reading    previously  prescribed medication never took it, now diet controlled (lost 30 pounds)    Multiple allergies     Patient Active Problem List   Diagnosis Date Noted   d/p dental extraction awaiting implant  10/08/2020   Hyperplastic polyp of ascending colon 12/02/2018   Hyperlipidemia 03/19/2018   Congenital pes planus 11/19/2007   Severe obesity ([place]) 08/16/2006   Anemia 08/16/2006    Past Surgical History:  Procedure Laterality Date   ABDOMINAL HYSTERECTOMY     APPENDECTOMY     BREAST BIOPSY Left 08/2016   CHOLECYSTECTOMY  2011   CYSTOSCOPY Bilateral 05/13/2019   Procedure: CYSTOSCOPY;  Surgeon: Lavonia Drafts, MD;  Location: [facility];  Service: Gynecology;  Laterality: Bilateral;   HYSTEROSCOPY WITH NOVASURE  06/03/2012   Procedure: HYSTEROSCOPY WITH NOVASURE;  Surgeon: [name], MD;  Location: [facility];  Service: Gynecology;  Laterality: N/A;   LAPAROSCOPIC APPENDECTOMY N/A 12/14/2015   Procedure: APPENDECTOMY  LAPAROSCOPIC CONVERTED TO OPEN;  Surgeon: Autumn Messing III, MD;  Location: [facility] [facility];  Service: General;  Laterality: N/A;   MULTIPLE TOOTH EXTRACTIONS     ORIF RADIUS & ULNA FRACTURES  2002   ROBOTIC ASSISTED TOTAL HYSTERECTOMY Bilateral 05/13/2019   Procedure: XI ROBOTIC ASSISTED TOTAL HYSTERECTOMY WITH SALPINGECTOMY, LYSIS OF ADHESION;  Surgeon: Lavonia Drafts, MD;  Location: [facility];  Service: Gynecology;  Laterality: Bilateral;   TONSILLECTOMY  2008   VAGINAL DELIVERY  1992   WISDOM TOOTH EXTRACTION       OB History     Gravida  3   Para  1   Term  1   Preterm      AB  2   Living  1      SAB      IAB  2   Ectopic      Multiple      Live Births  1           Family History  Problem Relation Age of Onset   Diabetes Father    Hypertension Father    Hypertension Mother    Renal cancer Brother    Diabetes Paternal Grandmother    Hypertension Paternal Grandmother    Diabetes Paternal Grandfather    Hypertension Paternal Grandfather    Allergic rhinitis Neg Hx    Angioedema Neg Hx    Asthma  Neg Hx    Atopy Neg Hx    Eczema Neg Hx    Immunodeficiency Neg Hx    Urticaria Neg Hx     Social History   Tobacco Use   Smoking status: Never   Smokeless tobacco: Never  Vaping Use   Vaping Use: Never used  Substance Use Topics   Alcohol use: Not Currently   Drug use: No    Home Medications Prior to Admission medications   Medication Sig Start Date End Date Taking? Authorizing Provider  Biotin 1000 MCG tablet Take 1,000 mcg by mouth daily.     [provider]  cetirizine (ZYRTEC) 10 MG tablet Take 10 mg by mouth daily as needed for allergies.     [provider]  cholecalciferol (VITAMIN D) 1000 units tablet Take 1 tablet (1,000 Units total) by mouth daily. 10/02/17   Carlyle Dolly, MD  ciclopirox (LOPROX) 0.77 % cream Apply topically 2 (two) times daily. 10/08/20   Martyn Malay, MD  EPINEPHrine (EPIPEN  2-PAK) 0.3 mg/0.3 mL IJ SOAJ injection Inject 0.3 mg into the muscle as needed for anaphylaxis. 10/08/20   Martyn Malay, MD  EPINEPHrine 0.3 mg/0.3 mL IJ SOAJ injection Inject 0.3 mLs (0.3 mg total) into the muscle as needed for anaphylaxis. 09/25/19   Martyn Malay, MD  Omega-3 1000 MG CAPS Take 1,000 mg by mouth daily.     [provider]    Allergies    Other, Sulfonamide derivatives, and Latex  Review of Systems   Review of Systems  Constitutional:  Negative for appetite change.  HENT:  Negative for congestion.   Respiratory:  Positive for chest tightness.   Cardiovascular:  Positive for chest pain. Negative for leg swelling.  Gastrointestinal:  Negative for abdominal pain.  Genitourinary:  Negative for flank pain.  Musculoskeletal:  Negative for back pain.  Skin:  Negative for rash.  Neurological:  Negative for weakness.  Psychiatric/Behavioral:  Negative for confusion.    Physical Exam Updated Vital Signs BP 128/73 (BP Location: Right Arm)   Pulse 68   Temp 98 F (36.7 C) (Oral)   Resp 15   Ht 5\' 4"  (1.626 m)   Wt 122.5 kg   LMP 04/25/2019   SpO2 99%   BMI 46.35 kg/m   Physical Exam Vitals and nursing note reviewed.  Constitutional:      Appearance: She is well-developed.  HENT:     Head: Atraumatic.  Eyes:     Pupils: Pupils are equal, round, and reactive to light.  Cardiovascular:     Rate and Rhythm: Normal rate and regular rhythm.  Pulmonary:     Breath sounds: No wheezing, rhonchi or rales.  Chest:     Chest wall: No tenderness.  Abdominal:     Tenderness: There is no abdominal tenderness.  Musculoskeletal:     Cervical back: Neck supple.     Right lower leg: No tenderness.     Left lower leg: No tenderness.     Comments: No tenderness calves.  May have slight edema to bilateral lower extremities.  Skin:    General: Skin is warm.     Capillary Refill: Capillary refill takes less than 2 seconds.  Neurological:     Mental Status: She is  alert and oriented to person, place, and time.    ED Results / Procedures / Treatments   Labs (all labs ordered are listed, but only abnormal results are displayed) Labs Reviewed  BASIC  METABOLIC PANEL - Abnormal; Notable for the following components:      Result Value   Glucose, Bld 113 (*)    All other components within normal limits  CBC - Abnormal; Notable for the following components:   Hemoglobin 10.1 (*)    HCT 31.5 (*)    MCV 70.0 (*)    MCH 22.4 (*)    All other components within normal limits  D-DIMER, QUANTITATIVE  TROPONIN I (HIGH SENSITIVITY)  TROPONIN I (HIGH SENSITIVITY)    EKG EKG Interpretation  Date/Time:  Tuesday March 15 2021 17:04:32 EDT Ventricular Rate:  90 PR Interval:  172 QRS Duration: 76 QT Interval:  374 QTC Calculation: 457 R Axis:   10 Text Interpretation: Normal sinus rhythm Possible Anterior infarct , age undetermined Abnormal ECG No significant change since last tracing Confirmed by Davonna Belling [phone]) on 03/15/2021 6:29:40 PM  Radiology DG Chest 2 View  Result Date: 03/15/2021 CLINICAL DATA:  Chest pain EXAM: CHEST - 2 VIEW COMPARISON:  02/16/2010 FINDINGS: The heart size and mediastinal contours are within normal limits. Both lungs are clear. The visualized skeletal structures are unremarkable. IMPRESSION: No active cardiopulmonary disease. Electronically Signed   By: Jerilynn Mages.  Shick M.D.   On: 03/15/2021 17:38   DG Tibia/Fibula Right  Result Date: 03/15/2021 CLINICAL DATA:  Foreign body on ultrasound EXAM: RIGHT TIBIA AND FIBULA - 2 VIEW COMPARISON:  Ultrasound 03/15/2021 FINDINGS: No fracture or malalignment is seen no definite radiopaque foreign body in the soft tissues. IMPRESSION: No definite radiographic correlate for the ultrasound possible foreign body. Could attempt CT with cutaneous marker to delineate area of pain/concern. Electronically Signed   By: Donavan Foil M.D.   On: 03/15/2021 21:15   US Venous Img Lower Unilateral  Right  Result Date: 03/15/2021 CLINICAL DATA:  Calf pain in a 50 year old female. EXAM: RIGHT LOWER EXTREMITY VENOUS DOPPLER ULTRASOUND TECHNIQUE: Gray-scale sonography with compression, as well as color and duplex ultrasound, were performed to evaluate the deep venous system(s) from the level of the common femoral vein through the popliteal and proximal calf veins. COMPARISON:  None. FINDINGS: VENOUS Normal compressibility of the common femoral, superficial femoral, and popliteal veins, as well as the visualized calf veins. Visualized portions of profunda femoral vein and great saphenous vein unremarkable. No filling defects to suggest DVT on grayscale or color Doppler imaging. Doppler waveforms show normal direction of venous flow, normal respiratory plasticity and response to augmentation. Limited views of the contralateral common femoral vein are unremarkable. OTHER Echogenic area with posterior acoustic shadowing in the RIGHT superior anterior lower extremity in the reported area of pain no vasculature visualized in the area of discomfort. Leading edge of this structure approximately 3 mm, approximately 5 mm deep to the skin surface. Small echogenic tract extends towards this area on sagittal images, unclear if this could be related to penetrating injury. Limitations: none IMPRESSION: Negative for DVT of the RIGHT lower extremity. Echogenic area with posterior acoustic shadowing in the subcutaneous fat of the anterior superior RIGHT lower extremity below-the-knee. Small echogenic tract extends to this area on sagittal images. Correlate with any penetrating injury to this area, this could also represent calcification in the soft tissues. Radiographic correlation could be helpful if there is any history of trauma in this location or concern for underlying foreign body/penetrating injury. Electronically Signed   By: Zetta Bills M.D.   On: 03/15/2021 19:55    Procedures Procedures   Medications Ordered in  ED Medications - No data  to display  ED Course  I have reviewed the triage vital signs and the nursing notes.  Pertinent labs & imaging results that were available during my care of the patient were reviewed by me and considered in my medical decision making (see chart for details).    MDM Rules/Calculators/A&P                           Patient with nonspecific chest pain.  Began today.  Although had some pain may be swelling in right lower extremity.  Ultrasound done reassuring.  D-dimer done and negative.  There was a questionable foreign body on the ultrasound on the anterior right lower leg.  X-ray done and did not show any foreign body.  Also patient is not aware of any foreign body in this area.  Skin looks fine.  Do not think we need further work-up at this time.  Doubt cardiac cause of the chest pain.  Doubt pulmonary embolism.  No pneumonia.  Discharge home Final Clinical Impression(s) / ED Diagnoses Final diagnoses:  Nonspecific chest pain  Pain of right lower extremity    Rx / DC Orders ED Discharge Orders     None        Davonna Belling, MD 03/16/21 0000

## 2021-03-15 NOTE — ED Triage Notes (Signed)
Pt c/o CP x today -pain to right LE started yesterday-denies injury-NAD-steady gait

## 2021-03-15 NOTE — ED Notes (Signed)
Up to [place] being done

## 2021-03-22 ENCOUNTER — Ambulatory Visit: Payer: BC Managed Care – PPO | Admitting: Family Medicine

## 2021-03-22 ENCOUNTER — Other Ambulatory Visit: Payer: Self-pay

## 2021-03-22 VITALS — BP 152/92 | HR 73 | Ht 64.0 in | Wt 263.5 lb

## 2021-03-22 DIAGNOSIS — R0789 Other chest pain: Secondary | ICD-10-CM | POA: Insufficient documentation

## 2021-03-22 MED ORDER — IBUPROFEN 600 MG PO TABS: 600.0000 mg | ORAL_TABLET | Freq: Three times a day (TID) | ORAL | 0 refills | Status: DC | PRN

## 2021-03-22 NOTE — Patient Instructions (Signed)
Thank you for coming into the office today.   As discussed, go to the emergency department if you have worsening chest pain that is accompanied by nausea, vomiting, sweating, pain in your neck or jaw.   Stop by the pharmacy to pick up your prescriptions.   IF YOU HAVE BEEN REFERRED TO A SPECIALIST, IT MAY TAKE 1-2 WEEKS TO SCHEDULE/PROCESS THE REFERRAL. IF YOU HAVE NOT HEARD FROM US/SPECIALIST IN TWO WEEKS, PLEASE GIVE [place] A CALL AT [phone].    Continue to work on your healthy eating habits and incorporating exercise into your daily life.   Continue taking your medications daily!   Take Care,   Dr. Susa Simmonds

## 2021-03-22 NOTE — Progress Notes (Signed)
   SUBJECTIVE:   CHIEF COMPLAINT / HPI:   Chief Complaint  Patient presents with   Follow-up    Urgent care     Jodi Saunders is a 50 y.o. female here for chest pain.    Chest Pain Jodi Saunders complains of chest pain. Pain in her leg started last Monday. Tuesdays she developed chest pain. CP went away on Wednesday without treatment. Chest pain returned today. Feels like a tightness in her substernal chest. Pain does not radiation. Prior to last week has never had any chest pain.  Patient rates pain as a 3/10 in intensity. Associated symptoms are: cough, decreased exercise tolerance. Denies claudication, dyspnea, lower extremity edema, near-syncope, orthopnea, palpitations, paroxysmal nocturnal dyspnea, syncope, and tachypnea. Aggravating factors VQX:IHWTU and inspiring cold air. No change with housework, large meals, and palpation of chest. Alleviating factors are: none. No vomiting, diaphoresis, numbness or tingling in her arms. Denies jaw, neck or back pain. Patient's cardiac risk factors are: obesity (BMI >= 30 kg/m2). Does not smoke, drink alcohol, illicit drug use.  No hx of PE or DVT.      PERTINENT  PMH / PSH: reviewed and updated as appropriate   OBJECTIVE:   BP (!) 152/92   Pulse 73   Ht 5\' 4"  (1.626 m)   Wt 263 lb 8 oz (119.5 kg)   LMP 04/25/2019   SpO2 98%   BMI 45.23 kg/m     GEN: pleasant well appearing female, in no acute distress  CV: regular rate and rhythm, no murmurs appreciated, no JVP CHEST: tenderness to palpation of sternal column extending laterally RESP: no increased work of breathing, clear to ascultation bilaterally ABD: Bowel sounds present. Soft, non-tender, non-distended.  MSK: no LE edema, or calf tenderness SKIN: warm, dry   ASSESSMENT/PLAN:   Atypical chest pain Seen in the ED last week with negative workup for PE and DVT. CXR was unremarkable. History not consistent with anxiety or GERD. Suspect costochondritis given sternocostal  tenderness on exam. Treat with Ibuprofen. She does have increased exercise tolerance which may be an anginal equivalent. Normal a1c and LDL 102. Discussed follow up with cardiology for possible ECHO and stress test. Pt agrees. Referral placed.      Lyndee Hensen, DO PGY-3, [facility] 03/22/2021

## 2021-03-22 NOTE — Assessment & Plan Note (Addendum)
Seen in the ED last week with negative workup for PE and DVT. CXR was unremarkable. History not consistent with anxiety or GERD. Suspect costochondritis given sternocostal tenderness on exam. Treat with Ibuprofen. She does have increased exercise tolerance which may be an anginal equivalent. Normal a1c and LDL 102. Discussed follow up with cardiology for possible ECHO and stress test. Pt agrees. Referral placed.

## 2021-05-05 ENCOUNTER — Encounter: Payer: Self-pay | Admitting: Cardiology

## 2021-05-05 ENCOUNTER — Other Ambulatory Visit: Payer: Self-pay

## 2021-05-05 ENCOUNTER — Ambulatory Visit: Payer: BC Managed Care – PPO | Admitting: Cardiology

## 2021-05-05 VITALS — BP 126/90 | HR 79 | Ht 64.0 in | Wt 272.0 lb

## 2021-05-05 DIAGNOSIS — R079 Chest pain, unspecified: Secondary | ICD-10-CM | POA: Diagnosis not present

## 2021-05-05 MED ORDER — METOPROLOL TARTRATE 100 MG PO TABS: 100.0000 mg | ORAL_TABLET | Freq: Once | ORAL | 0 refills | Status: DC

## 2021-05-05 NOTE — Patient Instructions (Addendum)
Medication Instructions:  Your physician recommends that you continue on your current medications as directed. Please refer to the Current Medication list given to you today.  *If you need a refill on your cardiac medications before your next appointment, please call your pharmacy*  Testing/Procedures: Your provider has recommended that you have a coronary CTA scan. Please see below for further instructions.   Follow-Up: At [facility], you and your health needs are our priority.  As part of our continuing mission to provide you with exceptional heart care, we have created designated Provider Care Teams.  These Care Teams include your primary Cardiologist (physician) and Advanced Practice Providers (APPs -  Physician Assistants and Nurse Practitioners) who all work together to provide you with the care you need, when you need it.  Follow up with Dr. Radford Pax as needed based on results of testing1}    Other Instructions   Your cardiac CT will be scheduled at:   [facility] [address] [place], [place] [address] [phone]  Please arrive at the [facility] main entrance (entrance A) of [facility] 30 minutes prior to test start time. You can use the FREE valet parking offered at the main entrance (encouraged to control the heart rate for the test) Proceed to the [facility] Radiology Department (first floor) to check-in and test prep.  Please follow these instructions carefully (unless otherwise directed):  On the Night Before the Test: Be sure to Drink plenty of water. Do not consume any caffeinated/decaffeinated beverages or chocolate 12 hours prior to your test. Do not take any antihistamines 12 hours prior to your test.  On the Day of the Test: Drink plenty of water until 1 hour prior to the test. Do not eat any food 4 hours prior to the test. You may take your regular medications prior to the test.  Take metoprolol (Lopressor) two hours prior to  test. FEMALES- please wear underwire-free bra if available, avoid dresses & tight clothing   After the Test: Drink plenty of water. After receiving IV contrast, you may experience a mild flushed feeling. This is normal. On occasion, you may experience a mild rash up to 24 hours after the test. This is not dangerous. If this occurs, you can take Benadryl 25 mg and increase your fluid intake. If you experience trouble breathing, this can be serious. If it is severe call 911 IMMEDIATELY. If it is mild, please call our office  Please allow 2-4 weeks for scheduling of routine cardiac CTs. Some insurance companies require a pre-authorization which may delay scheduling of this test.   For non-scheduling related questions, please contact the cardiac imaging nurse navigator should you have any questions/concerns: Marchia Bond, Cardiac Imaging Nurse Navigator Gordy Clement, Cardiac Imaging Nurse Navigator [facility] Direct Office Dial: [phone]   For scheduling needs, including cancellations and rescheduling, please call [place], [phone].

## 2021-05-05 NOTE — Addendum Note (Signed)
Addended by: Antonieta Iba on: 05/05/2021 09:29 AM   Modules accepted: Orders

## 2021-05-05 NOTE — Progress Notes (Addendum)
Cardiology CONSULT Note    Date:  05/05/2021   ID:  Jodi Saunders, DOB Dec 24, 1970, MRN [id number]  PCP:  Martyn Malay, MD  Cardiologist:  Fransico Him, MD   Chief Complaint  Patient presents with   New Patient (Initial Visit)    Chest pain      History of Present Illness:  Jodi Saunders is a 50 y.o. female who is being seen today for the evaluation of chest pain at the request of Hensel, Jamal Collin, MD.  This is a 50 year old obese AAF with a history of anemia, elevated blood pressure readings which resolved after weight loss who is now referred for evaluation of chest pain,.  SHe tells me that she awakened one morning with a pain in her leg.  The next morning she woke up and had a pain in her leg and in her chest.  She went to the ER.  DDimer was normal.  hsTrop was normal x 2.  She says that the pain felt like a tightness and squeezing discomfort in her chest centrally located with no radiation.  There was no associated nausea or diaphoresis.  She denies any DOE, LE edema, dizziness, palpitations or syncope.   Past Medical History:  Diagnosis Date   Abnormal uterine bleeding 04/21/2013   Allergy    Anemia    Chest pain    Elevated blood pressure reading    previously  prescribed medication never took it, now diet controlled (lost 30 pounds)    Multiple allergies     Past Surgical History:  Procedure Laterality Date   ABDOMINAL HYSTERECTOMY     APPENDECTOMY     BREAST BIOPSY Left 08/2016   CHOLECYSTECTOMY  2011   CYSTOSCOPY Bilateral 05/13/2019   Procedure: CYSTOSCOPY;  Surgeon: Lavonia Drafts, MD;  Location: [facility];  Service: Gynecology;  Laterality: Bilateral;   HYSTEROSCOPY WITH NOVASURE  06/03/2012   Procedure: HYSTEROSCOPY WITH NOVASURE;  Surgeon: [name], MD;  Location: [facility];  Service: Gynecology;  Laterality: N/A;   LAPAROSCOPIC APPENDECTOMY N/A 12/14/2015   Procedure: APPENDECTOMY LAPAROSCOPIC CONVERTED TO OPEN;   Surgeon: Autumn Messing III, MD;  Location: [facility];  Service: General;  Laterality: N/A;   MULTIPLE TOOTH EXTRACTIONS     ORIF RADIUS & ULNA FRACTURES  2002   ROBOTIC ASSISTED TOTAL HYSTERECTOMY Bilateral 05/13/2019   Procedure: XI ROBOTIC ASSISTED TOTAL HYSTERECTOMY WITH SALPINGECTOMY, LYSIS OF ADHESION;  Surgeon: Lavonia Drafts, MD;  Location: [facility];  Service: Gynecology;  Laterality: Bilateral;   TONSILLECTOMY  2008   VAGINAL DELIVERY  1992   WISDOM TOOTH EXTRACTION      Current Medications: Current Meds  Medication Sig   Biotin 1000 MCG tablet Take 1,000 mcg by mouth daily.    cetirizine (ZYRTEC) 10 MG tablet Take 10 mg by mouth daily as needed for allergies.    cholecalciferol (VITAMIN D) 1000 units tablet Take 1 tablet (1,000 Units total) by mouth daily.   ciclopirox (LOPROX) 0.77 % cream Apply topically as needed.   EPINEPHrine (EPIPEN 2-PAK) 0.3 mg/0.3 mL IJ SOAJ injection Inject 0.3 mg into the muscle as needed for anaphylaxis.   EPINEPHrine 0.3 mg/0.3 mL IJ SOAJ injection Inject 0.3 mLs (0.3 mg total) into the muscle as needed for anaphylaxis.   Omega-3 1000 MG CAPS Take 1,000 mg by mouth daily.    XIIDRA 5 % SOLN Apply 1 drop to eye 2 (two) times daily.    Allergies:  Other, Sulfonamide derivatives, and Latex   Social History   Socioeconomic History   Marital status: Single    Spouse name: Not on file   Number of children: Not on file   Years of education: Not on file   Highest education level: Not on file  Occupational History   Not on file  Tobacco Use   Smoking status: Never   Smokeless tobacco: Never  Vaping Use   Vaping Use: Never used  Substance and Sexual Activity   Alcohol use: Not Currently   Drug use: No   Sexual activity: Not on file  Other Topics Concern   Not on file  Social History Narrative   Not on file   Social Determinants of Health   Financial Resource Strain: Not on file  Food Insecurity: Not on file   Transportation Needs: Not on file  Physical Activity: Not on file  Stress: Not on file  Social Connections: Not on file     Family History:  The patient's family history includes Diabetes in her father, paternal grandfather, and paternal grandmother; Hypertension in her father, mother, paternal grandfather, and paternal grandmother; Renal cancer in her brother.   ROS:   Please see the history of present illness.    ROS All other systems reviewed and are negative.  No flowsheet data found.   PHYSICAL EXAM:   VS:  BP 126/90   Pulse 79   Ht 5\' 4"  (1.626 m)   Wt 272 lb (123.4 kg)   LMP 04/25/2019   SpO2 96%   BMI 46.69 kg/m    GEN: Well nourished, well developed, in no acute distress  HEENT: normal  Neck: no JVD, carotid bruits, or masses Cardiac: RRR; no murmurs, rubs, or gallops,no edema.  Intact distal pulses bilaterally.  Respiratory:  clear to auscultation bilaterally, normal work of breathing GI: soft, nontender, nondistended, + BS MS: no deformity or atrophy  Skin: warm and dry, no rash Neuro:  Alert and Oriented x 3, Strength and sensation are intact Psych: euthymic mood, full affect  Wt Readings from Last 3 Encounters:  05/05/21 272 lb (123.4 kg)  03/22/21 263 lb 8 oz (119.5 kg)  03/15/21 270 lb (122.5 kg)      Studies/Labs Reviewed:   EKG:  EKG is not ordered today.    Recent Labs: 10/08/2020: TSH 1.630 03/15/2021: BUN 14; Creatinine, Ser 0.67; Hemoglobin 10.1; Platelets 340; Potassium 3.9; Sodium 136   Lipid Panel    Component Value Date/Time   CHOL 181 10/08/2020 0920   TRIG 116 10/08/2020 0920   HDL 58 10/08/2020 0920   CHOLHDL 3.1 10/08/2020 0920   CHOLHDL 2.6 09/29/2014 1055   VLDL 22 09/29/2014 1055   LDLCALC 102 (H) 10/08/2020 0920    Additional studies/ records that were reviewed today include:  OV notes from PCP    ASSESSMENT:    1. Chest pain of uncertain etiology      PLAN:  In order of problems listed above:  Chest  pain -typical and atypical components>>nonexertional with no associated sx but was a tightness sensation.  Suspect that this could have been esophageal spasm with GERD.  -EKG in ER showed nonspecific T wave abnormality but normal hsTrop and ddimer. -recommend coronary CTA to define coronary anatomy  Time Spent: 20 minutes total time of encounter, including 15 minutes spent in face-to-face patient care on the date of this encounter. This time includes coordination of care and counseling regarding above mentioned problem list. Remainder of non-face-to-face  time involved reviewing chart documents/testing relevant to the patient encounter and documentation in the medical record. I have independently reviewed documentation from referring provider  Medication Adjustments/Labs and Tests Ordered: Current medicines are reviewed at length with the patient today.  Concerns regarding medicines are outlined above.  Medication changes, Labs and Tests ordered today are listed in the Patient Instructions below.  There are no Patient Instructions on file for this visit.   Signed, Fransico Him, MD  05/05/2021 9:17 AM    [address], [place], [place]  [address] Phone: [phone]; Fax: [phone]

## 2021-05-18 ENCOUNTER — Telehealth (HOSPITAL_COMMUNITY): Payer: Self-pay | Admitting: *Deleted

## 2021-05-18 NOTE — Telephone Encounter (Signed)
Reaching out to patient to offer assistance regarding upcoming cardiac imaging study; pt verbalizes understanding of appt date/time, parking situation and where to check in, pre-test NPO status and medications ordered, and verified current allergies; name and call back number provided for further questions should they arise  Gordy Clement RN Navigator Cardiac Imaging [facility] [phone] office [phone] cell  Patient to take 100mg  metoprolol tartrate two hours prior to cardiac CT. She is aware to arrive at 3:30pm for her 4pm scan.

## 2021-05-19 ENCOUNTER — Ambulatory Visit (HOSPITAL_COMMUNITY)
Admission: RE | Admit: 2021-05-19 | Discharge: 2021-05-19 | Disposition: A | Discharge status: Home / Self Care | Payer: BC Managed Care – PPO | Source: Ambulatory Visit | Attending: Cardiology | Admitting: Cardiology

## 2021-05-19 ENCOUNTER — Other Ambulatory Visit: Payer: Self-pay

## 2021-05-19 DIAGNOSIS — R079 Chest pain, unspecified: Secondary | ICD-10-CM | POA: Insufficient documentation

## 2021-05-19 DIAGNOSIS — I700 Atherosclerosis of aorta: Secondary | ICD-10-CM

## 2021-05-19 HISTORY — DX: Atherosclerosis of aorta: I70.0

## 2021-05-19 MED ORDER — IOHEXOL 350 MG/ML SOLN
100.0000 mL | Freq: Once | INTRAVENOUS | Status: AC | PRN
  Administered 2021-05-19: 100 mL via INTRAVENOUS

## 2021-05-19 MED ORDER — NITROGLYCERIN 0.4 MG SL SUBL
SUBLINGUAL_TABLET | SUBLINGUAL | Status: AC
  Filled 2021-05-19: qty 2

## 2021-05-19 MED ORDER — NITROGLYCERIN 0.4 MG SL SUBL
0.8000 mg | SUBLINGUAL_TABLET | Freq: Once | SUBLINGUAL | Status: AC
  Administered 2021-05-19: 0.8 mg via SUBLINGUAL

## 2021-05-19 NOTE — Progress Notes (Signed)
After completion of CT scan this RN brought patient back to IR nurses to recheck BP and discharge from here. Patient did receive 2 boluses of IV contrast in CT. Patient states "I did have some nausea after the contrast", however subsided over a few minutes. Patient then began to scratch at her upper chest however no notable hives or rash upon inspection. No angioedema, no shortness of breath or tightness in throat per patient. Another RN to dual assess patient. No further problems noted. Patient IV out and taken out via wheelchair.

## 2021-05-20 ENCOUNTER — Encounter: Payer: Self-pay | Admitting: Cardiology

## 2021-05-20 DIAGNOSIS — I700 Atherosclerosis of aorta: Secondary | ICD-10-CM | POA: Insufficient documentation

## 2021-05-25 ENCOUNTER — Telehealth: Payer: Self-pay | Admitting: Cardiology

## 2021-05-25 NOTE — Telephone Encounter (Signed)
Patient call back and results have been reviewed. Patient verbalized understanding.

## 2021-05-25 NOTE — Telephone Encounter (Signed)
Patient was returning call from [place] to go over test results. Please call back

## 2021-05-25 NOTE — Telephone Encounter (Signed)
Jodi Margarita, MD  05/20/2021 10:09 AM EST     Normal coronary arteries.  Mild aortic atherosclerosis  Left message for patient with results (ok per DPR). Advised to call back with any questions.

## 2021-07-26 ENCOUNTER — Other Ambulatory Visit: Payer: Self-pay

## 2021-07-26 ENCOUNTER — Ambulatory Visit: Payer: BC Managed Care – PPO | Admitting: Family Medicine

## 2021-07-26 VITALS — BP 145/92 | HR 96 | Ht 64.0 in | Wt 280.1 lb

## 2021-07-26 DIAGNOSIS — R0981 Nasal congestion: Secondary | ICD-10-CM | POA: Diagnosis not present

## 2021-07-26 DIAGNOSIS — J069 Acute upper respiratory infection, unspecified: Secondary | ICD-10-CM

## 2021-07-26 MED ORDER — FLUTICASONE PROPIONATE 50 MCG/ACT NA SUSP: 2.0000 | Freq: Every day | NASAL | 6 refills | Status: DC

## 2021-07-26 NOTE — Progress Notes (Signed)
° ° °  SUBJECTIVE:   CHIEF COMPLAINT / HPI:   Primary symptom: nasal congestion and headache. Patient drank about 40 oz of fluid and headache improved. She has used sterile nasal saline which has helped her symptoms.  Duration: 4 days, Friday 07/22/21 Severity: mild Associated symptoms:denies sore throat, fever, chills, SOB, cough, n/v/d, rash Fever? Tmax?: none Sick contacts: yes Covid test: 2 negative 2/3 and 2/5 at home tests Covid vaccination(s): x3  PERTINENT  PMH / PSH: non-contributory  OBJECTIVE:   BP (!) 145/92    Pulse 96    Ht 5\' 4"  (1.626 m)    Wt 280 lb 2 oz (127.1 kg)    LMP 04/25/2019    SpO2 100%    BMI 48.08 kg/m   Nursing note and vitals reviewed GEN: age-appropriate, AAW, resting comfortably in chair, NAD, class III obesity HEENT: NCAT. PERRLA. Sclera without injection or icterus. MMM. Clear oropharynx. Nasal passages erythematous and edematous Neck: Supple. No LAD Cardiac: Regular rate and rhythm. Normal S1/S2. No murmurs, rubs, or gallops appreciated. 2+ radial pulses. Lungs: Clear bilaterally to ascultation. No increased WOB, no accessory muscle usage. No w/r/r. Neuro: AOx3  Ext: no edema Psych: Pleasant and appropriate   ASSESSMENT/PLAN:   Sinus congestion Likely viral URI given symptoms. Continue supportive care with hydration, sinus nasal rinses. Return to care if purulent drainage at day 10 or fever >100.4*F.  - flonase daily     Gladys Damme, MD [place]

## 2021-07-26 NOTE — Patient Instructions (Signed)
It was a pleasure to see you today!  Use flonase once in each nostril each morning  At night use the nasal saline flush If you have purulent drainage (pus) or fever (temp over 100.4*F) especially at the 10 day mark, please return for evaluation, you may need antibiotics then    Be Well,  Dr. [address] [facility]

## 2021-07-26 NOTE — Assessment & Plan Note (Signed)
Likely viral URI given symptoms. Continue supportive care with hydration, sinus nasal rinses. Return to care if purulent drainage at day 10 or fever >100.4*F.  - flonase daily

## 2021-11-22 ENCOUNTER — Encounter: Payer: Self-pay | Admitting: *Deleted

## 2022-04-11 ENCOUNTER — Ambulatory Visit: Payer: BC Managed Care – PPO | Admitting: Student

## 2022-04-11 VITALS — BP 126/72 | HR 95 | Ht 64.0 in | Wt 274.4 lb

## 2022-04-11 DIAGNOSIS — R059 Cough, unspecified: Secondary | ICD-10-CM | POA: Diagnosis not present

## 2022-04-11 NOTE — Patient Instructions (Signed)

## 2022-04-11 NOTE — Progress Notes (Signed)
    SUBJECTIVE:   CHIEF COMPLAINT / HPI:   Patient is a 51 year old female who presents with cough and  body ache that started about 3 days ago.  Symptoms started with loss of voice on Friday and on Saturday patient describes having body aches and cough. No fevers but other associated symptom  include sore throat, running nose, nasal congestion, photosensitivity and phonosensitivity . She has had decreased appetite but drinking adequately. No known recent sick contact. No ear pain, vomiting or diarrhea. Home covid test was negative and haven't taken any meds, just tried warm water and tea.  Patient has had two COVID vaccines but not the recent booster   PERTINENT  PMH / PSH: Reviewed   OBJECTIVE:   BP 126/72   Pulse 95   Ht '5\' 4"'$  (1.626 m)   Wt 274 lb 6.4 oz (124.5 kg)   LMP 04/25/2019   SpO2 98%   BMI 47.10 kg/m     Physical Exam General: Alert, well appearing, NAD HEENT: Oropharyngeal erythema, no cervical lymphadenopathy or tonsillar exudate. Cardiovascular: RRR, No Murmurs, Normal S2/S2 Respiratory: CTAB, No wheezing or Rales Abdomen: No distension or tenderness   ASSESSMENT/PLAN:  Viral illness Patient with body ache,  cough, congestion, rhinorrhea and sore throat. On exam has oropharyngeal erythema,  good work of breathing on RA and clear breath sounds bilaterally. Overall presentation and exam is consistent with viral URI. Given relation of patient's symptoms obtained viral panel for COVID, influenza and RSV.  Discussed conservative management with patient. Recommended tylenol or ibuprofen for fever. Encouraged adequate hydration for patient. Outline signs and symptoms that will warrant ED visit or return for further assessment.     Alen Bleacher, MD [place]

## 2022-04-12 LAB — COVID-19, FLU A+B AND RSV
Influenza A, NAA: NOT DETECTED
Influenza B, NAA: NOT DETECTED
RSV, NAA: NOT DETECTED
SARS-CoV-2, NAA: NOT DETECTED

## 2022-08-14 ENCOUNTER — Ambulatory Visit (INDEPENDENT_AMBULATORY_CARE_PROVIDER_SITE_OTHER): Payer: BC Managed Care – PPO | Admitting: Family Medicine

## 2022-08-14 ENCOUNTER — Other Ambulatory Visit: Payer: Self-pay

## 2022-08-14 VITALS — BP 152/88 | HR 89 | Ht 64.0 in | Wt 278.0 lb

## 2022-08-14 DIAGNOSIS — Z91010 Allergy to peanuts: Secondary | ICD-10-CM | POA: Diagnosis not present

## 2022-08-14 DIAGNOSIS — R058 Other specified cough: Secondary | ICD-10-CM

## 2022-08-14 DIAGNOSIS — R030 Elevated blood-pressure reading, without diagnosis of hypertension: Secondary | ICD-10-CM

## 2022-08-14 MED ORDER — BENZONATATE 100 MG PO CAPS
200.0000 mg | ORAL_CAPSULE | Freq: Two times a day (BID) | ORAL | 0 refills | Status: DC | PRN
Start: 2022-08-14 — End: 2022-09-04

## 2022-08-14 MED ORDER — EPINEPHRINE 0.3 MG/0.3ML IJ SOAJ: 0.3000 mg | INTRAMUSCULAR | 1 refills | Status: DC | PRN

## 2022-08-14 NOTE — Patient Instructions (Addendum)
It was wonderful to see you today.  Please bring ALL of your medications with you to every visit.   Today we talked about:  I agree- this is either related to allergies or a viral illness.  You can take Flonase daily.  You can take Tylenol or Ibuprofen as needed for fevers I am sending a medication for cough that you can use up to three times a day as needed. Honey is also great with helping cough. You can use cough drops as needed as well.   -You are overdue for a Pap smear. This is a screening test to check for signs of cervical cancer. Please schedule and appointment to return for this at your earliest convenience.    Thank you for coming to your visit as scheduled. We have had a large "no-show" problem lately, and this significantly limits our ability to see and care for patients. As a friendly reminder- if you cannot make your appointment please call to cancel. We do have a no show policy for those who do not cancel within 24 hours. Our policy is that if you miss or fail to cancel an appointment within 24 hours, 3 times in a 20-monthperiod, you may be dismissed from our clinic.   Thank you for choosing [facility]   Please call [phone] any questions about today's appointment.  Please be sure to schedule follow up at the front  desk before you leave today.   ASharion Settler DO PGY-3 Family Medicine

## 2022-08-14 NOTE — Progress Notes (Cosign Needed Addendum)
    SUBJECTIVE:   CHIEF COMPLAINT / HPI:   Jodi Saunders is a 52 y.o. female who presents to the [facility] clinic today to discuss the following concerns:   Sick Symptoms Starting Thursday (4 days ago) she began to have a sore throat, headache, low energy. Saturday had low abdominal stomach cramps and a dry cough with associated stress incontinence (wearing pads). This morning she reports starting to lose her voice.   She recently got married and has been moving into her new home. She is wondering if her symptoms are related to allergies as she has been moving several boxes, dealing with dust, etc.   No fevers. She did have chills on Friday night (3 days ago) but she attributes it to just being cold.  She has been taking Nyquil at night to deal with her cough. She has been drinking pineapple juice at night and taking Riccola cough drops. She has been taking Zyrtec.  PERTINENT  PMH / PSH: HLD, obesity   OBJECTIVE:   BP (!) 165/92   Pulse 89   Ht 5' 4"$  (1.626 m)   Wt 278 lb (126.1 kg)   LMP 04/25/2019   SpO2 100%   BMI 47.72 kg/m   Vitals:   08/14/22 1342 08/14/22 1401  BP: (!) 165/92 (!) 152/88  Pulse: 89   SpO2: 100%    General: NAD, pleasant, able to participate in exam HEENT: Normocephalic, oropharynx clear, s/p tonsillectomy, slight rhinorrhea, TM clear b/l Neck: Supple, no cervical LAD  Cardiac: RRR, no murmurs. Respiratory: CTAB, normal effort, No wheezes, rales or rhonchi Abdomen: Bowel sounds present, non-distended Skin: warm and dry Psych: Normal affect and mood  ASSESSMENT/PLAN:   1. Peanut allergy Refilled given that her pen has expired. She reports never having to use her Epi pen in the past. Has had formal allergy testing done by her allergist in the past but no longer following with an allergist. - EPINEPHrine 0.3 mg/0.3 mL IJ SOAJ injection; Inject 0.3 mg into the muscle as needed for anaphylaxis.  Dispense: 2 each; Refill: 1  2. Dry cough With associated  stress incontinence. Differential includes viral URI versus postnasal drip versus allergies given her most recent move. She appears well today. Examination was reassuring, lungs clear and SpO2 100% without tachycardia.  - Continue conservative treatment  - Recommend pelvic floor exercises- discussed Kegels today - Recommend use of Flonase  - benzonatate (TESSALON) 100 MG capsule; Take 2 capsules (200 mg total) by mouth 2 (two) times daily as needed for cough.  Dispense: 20 capsule; Refill: 0  3. Elevated blood pressure reading without diagnosis of hypertension Elevated on 2 checks. Asymptomatic. I have scheduled her for 24-hour ambulatory blood pressure monitoring in our office for further assessment.    Sharion Settler, [place]

## 2022-08-15 ENCOUNTER — Other Ambulatory Visit: Payer: Self-pay

## 2022-08-15 ENCOUNTER — Emergency Department (HOSPITAL_BASED_OUTPATIENT_CLINIC_OR_DEPARTMENT_OTHER): Payer: BC Managed Care – PPO | Admitting: Radiology

## 2022-08-15 ENCOUNTER — Emergency Department (HOSPITAL_BASED_OUTPATIENT_CLINIC_OR_DEPARTMENT_OTHER)
Admission: EM | Admit: 2022-08-15 | Discharge: 2022-08-16 | Disposition: A | Discharge status: Home / Self Care | Payer: BC Managed Care – PPO | Attending: Emergency Medicine | Admitting: Emergency Medicine

## 2022-08-15 ENCOUNTER — Encounter (HOSPITAL_BASED_OUTPATIENT_CLINIC_OR_DEPARTMENT_OTHER): Payer: Self-pay | Admitting: Emergency Medicine

## 2022-08-15 DIAGNOSIS — R059 Cough, unspecified: Secondary | ICD-10-CM | POA: Insufficient documentation

## 2022-08-15 DIAGNOSIS — Z20822 Contact with and (suspected) exposure to covid-19: Secondary | ICD-10-CM | POA: Diagnosis not present

## 2022-08-15 DIAGNOSIS — H66002 Acute suppurative otitis media without spontaneous rupture of ear drum, left ear: Secondary | ICD-10-CM

## 2022-08-15 DIAGNOSIS — Z91040 Latex allergy status: Secondary | ICD-10-CM | POA: Diagnosis not present

## 2022-08-15 DIAGNOSIS — J069 Acute upper respiratory infection, unspecified: Secondary | ICD-10-CM

## 2022-08-15 DIAGNOSIS — H9202 Otalgia, left ear: Secondary | ICD-10-CM | POA: Diagnosis present

## 2022-08-15 DIAGNOSIS — H6692 Otitis media, unspecified, left ear: Secondary | ICD-10-CM | POA: Insufficient documentation

## 2022-08-15 DIAGNOSIS — M542 Cervicalgia: Secondary | ICD-10-CM | POA: Insufficient documentation

## 2022-08-15 LAB — CBC WITH DIFFERENTIAL/PLATELET
Abs Immature Granulocytes: 0.05 10*3/uL (ref 0.00–0.07)
Basophils Absolute: 0.1 10*3/uL (ref 0.0–0.1)
Basophils Relative: 1 %
Eosinophils Absolute: 0.3 10*3/uL (ref 0.0–0.5)
Eosinophils Relative: 3 %
HCT: 34.2 % — ABNORMAL LOW (ref 36.0–46.0)
Hemoglobin: 10.4 g/dL — ABNORMAL LOW (ref 12.0–15.0)
Immature Granulocytes: 1 %
Lymphocytes Relative: 24 %
Lymphs Abs: 2.5 10*3/uL (ref 0.7–4.0)
MCH: 21.1 pg — ABNORMAL LOW (ref 26.0–34.0)
MCHC: 30.4 g/dL (ref 30.0–36.0)
MCV: 69.4 fL — ABNORMAL LOW (ref 80.0–100.0)
Monocytes Absolute: 0.7 10*3/uL (ref 0.1–1.0)
Monocytes Relative: 7 %
Neutro Abs: 6.6 10*3/uL (ref 1.7–7.7)
Neutrophils Relative %: 64 %
Platelets: 408 10*3/uL — ABNORMAL HIGH (ref 150–400)
RBC: 4.93 MIL/uL (ref 3.87–5.11)
RDW: 15.1 % (ref 11.5–15.5)
WBC: 10.2 10*3/uL (ref 4.0–10.5)
nRBC: 0 % (ref 0.0–0.2)

## 2022-08-15 LAB — URINALYSIS, ROUTINE W REFLEX MICROSCOPIC
Bilirubin Urine: NEGATIVE
Glucose, UA: NEGATIVE mg/dL
Hgb urine dipstick: NEGATIVE
Ketones, ur: NEGATIVE mg/dL
Leukocytes,Ua: NEGATIVE
Nitrite: NEGATIVE
Protein, ur: NEGATIVE mg/dL
Specific Gravity, Urine: 1.025 (ref 1.005–1.030)
pH: 5.5 (ref 5.0–8.0)

## 2022-08-15 LAB — COMPREHENSIVE METABOLIC PANEL
ALT: 20 U/L (ref 0–44)
AST: 19 U/L (ref 15–41)
Albumin: 4.7 g/dL (ref 3.5–5.0)
Alkaline Phosphatase: 79 U/L (ref 38–126)
Anion gap: 10 (ref 5–15)
BUN: 8 mg/dL (ref 6–20)
CO2: 28 mmol/L (ref 22–32)
Calcium: 9.8 mg/dL (ref 8.9–10.3)
Chloride: 101 mmol/L (ref 98–111)
Creatinine, Ser: 0.74 mg/dL (ref 0.44–1.00)
GFR, Estimated: >60 mL/min (ref >60)
Glucose, Bld: 121 mg/dL — ABNORMAL HIGH (ref 70–99)
Potassium: 3.7 mmol/L (ref 3.5–5.1)
Sodium: 139 mmol/L (ref 135–145)
Total Bilirubin: 0.6 mg/dL (ref 0.3–1.2)
Total Protein: 8.2 g/dL — ABNORMAL HIGH (ref 6.5–8.1)

## 2022-08-15 LAB — RESP PANEL BY RT-PCR (RSV, FLU A&B, COVID)  RVPGX2
Influenza A by PCR: NEGATIVE
Influenza B by PCR: NEGATIVE
Resp Syncytial Virus by PCR: NEGATIVE
SARS Coronavirus 2 by RT PCR: NEGATIVE

## 2022-08-15 LAB — GROUP A STREP BY PCR: Group A Strep by PCR: NOT DETECTED

## 2022-08-15 NOTE — ED Triage Notes (Signed)
Pt via pov from home with chest pain intermittently today and a headache that started this afternoon; she also reports a cough that began 4 days ago. Also reports nasal congestion today and intermittent sore throat as well. Pt alert & oriented, nad noted.

## 2022-08-16 MED ORDER — AZITHROMYCIN 250 MG PO TABS: 250.0000 mg | ORAL_TABLET | Freq: Every day | ORAL | 0 refills | Status: DC

## 2022-08-16 MED ORDER — AZITHROMYCIN 250 MG PO TABS
500.0000 mg | ORAL_TABLET | Freq: Once | ORAL | Status: AC
  Administered 2022-08-16: 500 mg via ORAL
  Filled 2022-08-16: qty 2

## 2022-08-16 NOTE — ED Notes (Signed)
Pt verbalized understanding of d/c instructions, meds, and followup care. Denies questions. VSS, no distress noted. Steady gait to exit with all belongings. 

## 2022-08-16 NOTE — ED Provider Notes (Signed)
[facility] Provider Note   CSN: [id number] Arrival date & time: 08/15/22  1902     History  Chief Complaint  Patient presents with   Chest Pain   Headache    Jodi Saunders is a 52 y.o. female.  Patient is a 52 year old female with history of hyperlipidemia presenting with complaints of cough and left ear pain.  Patient reports that she began coughing yesterday.  She coughed so hard that she developed discomfort in her chest and neck.  Later on in the day, her ear began to ache.  She reports muffled hearing.  She denies to me she is short of breath.  She denies productive cough.  She denies fevers, chills, or ill contacts.  The history is provided by the patient.       Home Medications Prior to Admission medications   Medication Sig Start Date End Date Taking? Authorizing Provider  benzonatate (TESSALON) 100 MG capsule Take 2 capsules (200 mg total) by mouth 2 (two) times daily as needed for cough. 08/14/22   Sharion Settler, DO  Biotin 1000 MCG tablet Take 1,000 mcg by mouth daily.  Patient not taking: Reported on 08/14/2022    [provider]  cetirizine (ZYRTEC) 10 MG tablet Take 10 mg by mouth daily as needed for allergies.     [provider]  cholecalciferol (VITAMIN D) 1000 units tablet Take 1 tablet (1,000 Units total) by mouth daily. Patient not taking: Reported on 08/14/2022 10/02/17   Carlyle Dolly, MD  ciclopirox (LOPROX) 0.77 % cream Apply topically 2 (two) times daily. Patient not taking: Reported on 05/05/2021 10/08/20   Martyn Malay, MD  ciclopirox (LOPROX) 0.77 % cream Apply topically as needed.    [provider]  EPINEPHrine 0.3 mg/0.3 mL IJ SOAJ injection Inject 0.3 mg into the muscle as needed for anaphylaxis. 08/14/22   Sharion Settler, DO  fluticasone (FLONASE) 50 MCG/ACT nasal spray Place 2 sprays into both nostrils daily. Patient not taking: Reported on 08/14/2022 07/26/21    Gladys Damme, MD  ibuprofen (ADVIL) 600 MG tablet Take 1 tablet (600 mg total) by mouth every 8 (eight) hours as needed. Patient not taking: Reported on 05/05/2021 03/22/21   Lyndee Hensen, DO  Omega-3 1000 MG CAPS Take 1,000 mg by mouth daily.     [provider]  XIIDRA 5 % SOLN Apply 1 drop to eye 2 (two) times daily. 02/22/21   [provider]      Allergies    Other, Sulfonamide derivatives, and Latex    Review of Systems   Review of Systems  All other systems reviewed and are negative.   Physical Exam Updated Vital Signs BP (!) 161/82   Pulse 79   Temp 98.2 F (36.8 C) (Oral)   Resp 15   Ht '5\' 4"'$  (1.626 m)   Wt 126.6 kg   LMP 04/25/2019   SpO2 97%   BMI 47.89 kg/m  Physical Exam Vitals and nursing note reviewed.  Constitutional:      General: She is not in acute distress.    Appearance: She is well-developed. She is not diaphoretic.  HENT:     Head: Normocephalic and atraumatic.     Comments: The left TM appears erythematous. Cardiovascular:     Rate and Rhythm: Normal rate and regular rhythm.     Heart sounds: No murmur heard.    No friction rub. No gallop.  Pulmonary:  Effort: Pulmonary effort is normal. No respiratory distress.     Breath sounds: Normal breath sounds. No wheezing.  Abdominal:     General: Bowel sounds are normal. There is no distension.     Palpations: Abdomen is soft.     Tenderness: There is no abdominal tenderness.  Musculoskeletal:        General: Normal range of motion.     Cervical back: Normal range of motion and neck supple.  Skin:    General: Skin is warm and dry.  Neurological:     General: No focal deficit present.     Mental Status: She is alert and oriented to person, place, and time.     ED Results / Procedures / Treatments   Labs (all labs ordered are listed, but only abnormal results are displayed) Labs Reviewed  COMPREHENSIVE METABOLIC PANEL - Abnormal; Notable for the following  components:      Result Value   Glucose, Bld 121 (*)    Total Protein 8.2 (*)    All other components within normal limits  CBC WITH DIFFERENTIAL/PLATELET - Abnormal; Notable for the following components:   Hemoglobin 10.4 (*)    HCT 34.2 (*)    MCV 69.4 (*)    MCH 21.1 (*)    Platelets 408 (*)    All other components within normal limits  RESP PANEL BY RT-PCR (RSV, FLU A&B, COVID)  [id number]  GROUP A STREP BY PCR  URINALYSIS, ROUTINE W REFLEX MICROSCOPIC    EKG None  Radiology DG Chest 2 View  Result Date: 08/15/2022 CLINICAL DATA:  Cough and chest pain intermittently with headache. EXAM: CHEST - 2 VIEW COMPARISON:  03/15/2021 FINDINGS: Lungs are adequately inflated and otherwise clear. Cardiomediastinal silhouette and remainder of the exam is unchanged. IMPRESSION: No active cardiopulmonary disease. Electronically Signed   By: Marin Olp M.D.   On: 08/15/2022 20:22    Procedures Procedures    Medications Ordered in ED Medications  azithromycin (ZITHROMAX) tablet 500 mg (has no administration in time range)    ED Course/ Medical Decision Making/ A&P  Patient presenting with complaints of cough, headache, and ear pain as described in the HPI.  She arrives here with stable vital signs and no hypoxia.  She is afebrile.  The left TM appears erythematous, but otherwise physical examination is unremarkable.  Laboratory studies were obtained in triage including CBC and metabolic panel.  These were both unremarkable.  COVID/flu/RSV swab all negative.  At this point, patient will be treated with Zithromax for a left otitis media.  Patient to be discharged with return as needed.  Final Clinical Impression(s) / ED Diagnoses Final diagnoses:  None    Rx / DC Orders ED Discharge Orders     None         Veryl Speak, MD 08/16/22 [phone]

## 2022-08-16 NOTE — Discharge Instructions (Addendum)
Begin taking Zithromax as prescribed.  Begin taking over-the-counter medications as needed for relief of symptoms.

## 2022-08-18 ENCOUNTER — Ambulatory Visit: Payer: BC Managed Care – PPO | Admitting: Family Medicine

## 2022-08-18 VITALS — BP 150/90 | HR 81 | Temp 98.5°F | Ht 64.0 in | Wt 276.5 lb

## 2022-08-18 DIAGNOSIS — R0981 Nasal congestion: Secondary | ICD-10-CM

## 2022-08-18 DIAGNOSIS — H9202 Otalgia, left ear: Secondary | ICD-10-CM

## 2022-08-18 DIAGNOSIS — J069 Acute upper respiratory infection, unspecified: Secondary | ICD-10-CM

## 2022-08-18 MED ORDER — FLUTICASONE PROPIONATE 50 MCG/ACT NA SUSP: 2.0000 | Freq: Every day | NASAL | 6 refills | Status: DC

## 2022-08-18 MED ORDER — CIPROFLOXACIN-DEXAMETHASONE 0.3-0.1 % OT SUSP: 4.0000 [drp] | Freq: Two times a day (BID) | OTIC | 0 refills | Status: AC

## 2022-08-18 NOTE — Progress Notes (Signed)
    SUBJECTIVE:   CHIEF COMPLAINT / HPI:   Left ear pain - Started on Tuesday - Pain is better, but still having pressure in her left ear as well as muffled hearing - Pain goes down the side of her neck/face as well  Cough - Dry cough with headache - Used Tessalon Perles without improvement - Otherwise doing well  PERTINENT  PMH / PSH: Reviewed  OBJECTIVE:   BP (!) 150/90   Pulse 81   Temp 98.5 F (36.9 C) (Oral)   Ht '5\' 4"'$  (1.626 m)   Wt 276 lb 8 oz (125.4 kg)   LMP 04/25/2019   SpO2 99%   BMI 47.46 kg/m   Gen: well-appearing, NAD CV: RRR, no m/r/g appreciated, no peripheral edema Pulm: CTAB, no wheezes/crackles HEENT: Right TM and canal normal, left TM with mild effusion canal with erythema present without exudate.  Left external canal painful with manipulation of the ear.  Mild pharyngeal erythema, no tonsillar exudates  ASSESSMENT/PLAN:   Left ear pain  Recently diagnosed with left AOM in the ER and treated with azithromycin.  Infection appears to have mostly resolved, but ear canal showing signs of irritation and possible infection.  Does not appear to need oral antibiotics, but will do a trial of topical. - Ciprodex twice daily x 7 days - Follow-up if no improvement - Discussed reasons to return to care over the weekend  Dry cough Most likely has a viral URI, was seen within the last week for the dry cough.  Tessalon Perles not improving.  Discussed with patient that OTC medications and prescription medications for cough are typically not very effective.  Did recommend using local honey if able. - Supportive care reviewed  Elevated blood pressure Patient not feeling well and has elevated blood pressure in the office.  He is already scheduled with pharmacy team in the clinic to do blood pressure monitoring  Carrolyn Hilmes, [place]

## 2022-08-18 NOTE — Patient Instructions (Signed)
I am going to send in eardrops called Ciprodex which has little bit of an antibiotic and a steroid in it to help calm down the inflammation in your ear.  Do note that you can also have eustachian tube dysfunction which can cause some of the similar symptoms that you are having.  I would recommend using nasal saline spray or the equivalent of the Aggie Moats to try and help open the area up.  Which can also do is also use Flonase, I sent in a refill of your prescription.

## 2022-09-04 ENCOUNTER — Ambulatory Visit: Payer: BC Managed Care – PPO | Admitting: Pharmacist

## 2022-09-04 ENCOUNTER — Encounter: Payer: Self-pay | Admitting: Pharmacist

## 2022-09-04 VITALS — BP 145/83 | HR 78 | Wt 281.8 lb

## 2022-09-04 DIAGNOSIS — I10 Essential (primary) hypertension: Secondary | ICD-10-CM | POA: Diagnosis not present

## 2022-09-04 NOTE — Assessment & Plan Note (Signed)
History of elevated BP readings over the last year; Family history of HTN and goal presssure of <130/80. Placed ambulatory BP monitor on left arm. Written patient instructions provided. Patient verbalized understanding of treatment plan.

## 2022-09-04 NOTE — Patient Instructions (Signed)
It was great seeing you today!  Blood Pressure Activity Diary Time Lying down/ Sleeping Walking/ Exercise Stressed/ Angry Headache/ Pain Dizzy  9 AM       10 AM       11 AM       12 PM       1 PM       2 PM       Time Lying down/ Sleeping Walking/ Exercise Stressed/ Angry Headache/ Pain Dizzy  3 PM       4 PM        5 PM       6 PM       7 PM       8 PM       Time Lying down/ Sleeping Walking/ Exercise Stressed/ Angry Headache/ Pain Dizzy  9 PM       10 PM       11 PM       12 AM       1 AM       2 AM       3 AM       Time Lying down/ Sleeping Walking/ Exercise Stressed/ Angry Headache/ Pain Dizzy  4 AM       5 AM       6 AM       7 AM       8 AM       9 AM       10 AM        Time you woke up: _________                  Time you went to sleep:__________  Come back tomorrow at 8:30 AM to have the monitor removed Call the [facility] if you have any questions before then ([phone])  Wearing the Blood Pressure Monitor The cuff will inflate every 20 minutes during the day and every 30 minutes while you sleep. Your blood pressure readings will NOT display after cuff inflation Fill out the blood pressure-activity diary during the day, especially during activities that may affect your reading -- such as exercise, stress, walking, taking your blood pressure medications  Important things to know: Avoid taking the monitor off for the next 24 hours, unless it causes you discomfort or pain. Do NOT get the monitor wet and do NOT dry to clean the monitor with any cleaning products. Do NOT put the monitor on anyone else's arm. When the cuff inflates, avoid excess movement. Let the cuffed arm hang loosely, slightly away from the body. Avoid flexing the muscles or moving the hand/fingers. When you go to sleep, make sure that the hose is not kinked. Remember to fill out the blood pressure activity diary. If you experience severe pain or unusual pain (not associated  with getting your blood pressure checked), remove the monitor.  Troubleshooting:  Code  Troubleshooting   1  Check cuff position, tighten cuff   2, 3  Remain still during reading   4, 87  Check air hose connections and make sure cuff is tight   85, 89  Check hose connections and make tubing is not crimped   86  Push START/STOP to restart reading   88, 91  Retry by pushing START/STOP   90  Replace batteries. If problem persists, remove monitor and bring back to   clinic at follow up   97, 98, 99  Service required -  Remove monitor and bring back to clinic at follow up

## 2022-09-04 NOTE — Progress Notes (Signed)
S:     Chief Complaint  Patient presents with   Medication Management    Amblulatory BP monitor    52 y.o. female who presents for hypertension evaluation, education, and management.  No significant PMH; previous history of elevated BP  Patient was referred and last seen by Primary Care Provider, Dr. Nita Sells, on 08/14/22.  At last visit, patient was experiencing allergic symptoms (sore throat, dry cough, headache, low energy) and elevated BP.  Has not been diagnosed with hypertension.  Family history of hypertension is significant with mother, father and paternal grandparents.   Medication compliance is reported to be adherent.  Admits she does not like to take medication.   Discussed procedure for wearing the monitor and gave patient written instructions. Monitor was placed on non-dominant arm with instructions to return in the morning.   Current BP Medications include:  None  Antihypertensives tried in the past include: None   O:  Review of Systems  HENT:  Positive for congestion and ear pain.     Physical Exam Constitutional:      Appearance: Normal appearance.  Neurological:     Mental Status: She is alert.  Psychiatric:        Mood and Affect: Mood normal.        Behavior: Behavior normal.        Thought Content: Thought content normal.        Judgment: Judgment normal.     Last 3 Office BP readings: BP Readings from Last 3 Encounters:  09/04/22 (!) 145/83  08/18/22 (!) 150/90  08/15/22 (!) 161/82    Clinical Atherosclerotic Cardiovascular Disease (ASCVD): No  The 10-year ASCVD risk score (Arnett DK, et al., 2019) is: 2.9%   Values used to calculate the score:     Age: 4 years     Sex: Female     Is Non-Hispanic African American: Yes     Diabetic: No     Tobacco smoker: No     Systolic Blood Pressure: Q000111Q mmHg     Is BP treated: No     HDL Cholesterol: 58 mg/dL     Total Cholesterol: 181 mg/dL  Basic Metabolic Panel    Component Value  Date/Time   NA 139 08/15/2022 1952   NA 140 09/22/2019 1409   K 3.7 08/15/2022 1952   CL 101 08/15/2022 1952   CO2 28 08/15/2022 1952   GLUCOSE 121 (H) 08/15/2022 1952   BUN 8 08/15/2022 1952   BUN 7 09/22/2019 1409   CREATININE 0.74 08/15/2022 1952   CREATININE 0.64 09/29/2014 1055   CALCIUM 9.8 08/15/2022 1952   GFRNONAA >60 08/15/2022 1952   GFRAA 119 09/22/2019 1409    Renal function: Estimated Creatinine Clearance: 110.2 mL/min (by C-G formula based on SCr of 0.74 mg/dL).   ABPM Study Data: Arm Placement left arm  For Office Goal Goal BP of <130/80:  ABPM thresholds: Overall BP < 125/75, daytime BP <130/80 mmHg, sleeptime BP <110/65 mmHg   A/P: History of elevated BP readings over the last year; Family history of HTN and goal presssure of <130/80. Placed ambulatory BP monitor on left arm. Written patient instructions provided. Patient verbalized understanding of treatment plan.    Ear Pain Patient continues to have ear complaints (popping/congestion). Pain somewhat resolved. Scheduled for work-in visit 3/19 at 9:10   Total time in face to face counseling 20 minutes.    Follow-up:  Pharmacist 09/05/22 Patient seen with Estelle June, PharmD Candidate.

## 2022-09-05 ENCOUNTER — Ambulatory Visit: Payer: BC Managed Care – PPO | Admitting: Pharmacist

## 2022-09-05 ENCOUNTER — Ambulatory Visit: Payer: BC Managed Care – PPO | Admitting: Student

## 2022-09-05 ENCOUNTER — Encounter: Payer: Self-pay | Admitting: Pharmacist

## 2022-09-05 VITALS — BP 138/76 | HR 74 | Ht 64.0 in | Wt 279.2 lb

## 2022-09-05 DIAGNOSIS — I10 Essential (primary) hypertension: Secondary | ICD-10-CM

## 2022-09-05 DIAGNOSIS — H938X2 Other specified disorders of left ear: Secondary | ICD-10-CM

## 2022-09-05 MED ORDER — VALSARTAN 40 MG PO TABS: 40.0000 mg | ORAL_TABLET | Freq: Every day | ORAL | 1 refills | Status: DC

## 2022-09-05 NOTE — Patient Instructions (Addendum)
You do have a little bit of fluid behind your ear which is called secretory otitis media. This fluid may accumulate in the middle ear as a result of a cold, sore throat or upper respiratory infection. OME is usually self-limited, which means, the fluid usually resolves on its own within 4 to 6 weeks.  Start using Zyrtec every day.  Use Flonase daily as well.  Best wishes, Dr. Owens Shark

## 2022-09-05 NOTE — Assessment & Plan Note (Signed)
Seen by Dr. Valentina Lucks this morning. Started on valsartan 40 mg daily. BP above goal at 138/76, goal less than 130/80 Follow-up with PCP

## 2022-09-05 NOTE — Progress Notes (Signed)
    SUBJECTIVE:   CHIEF COMPLAINT / HPI:   Jodi Saunders is a 52 year old female here for left ear congestion .  Recently treated for AOM initially with Azithromycin on 08/15/22, then Ciprodex x 7 days.  She says she has a history of bad seasonal allergies.  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  No fever. No ear pain. Ear feels plugged and congested.  PERTINENT  PMH / PSH: Elevated blood pressure, obesity, hyperlipidemia  OBJECTIVE:   BP 138/76   Pulse 74   Ht 5\' 4"  (1.626 m)   Wt 279 lb 3.2 oz (126.6 kg)   LMP 04/25/2019   SpO2 98%   BMI 47.92 kg/m   General: Well-appearing, pleasant, no distress HEENT: MMM. Good dentition. Left TM with serous fluid, no bulging or effusion. Right TM normal. Respiratory: Normal WOB on room air  ASSESSMENT/PLAN:   Congestion of left ear Serous fluid behind left TM, but no bulging or effusion indicative of acute otitis media. Discussed using Flonase daily as well as Zyrtec for seasonal allergies. Return precautions discussed.  Elevated blood pressure reading with diagnosis of hypertension Seen by Dr. Valentina Lucks this morning. Started on valsartan 40 mg daily. BP above goal at 138/76, goal less than 130/80 Follow-up with PCP     Orvis Brill, [place]

## 2022-09-05 NOTE — Patient Instructions (Addendum)
It was great to see you today! Your 24-hour blood pressure report showed elevated blood pressure, so we are going to send you home with a new prescription medication.  Medication Changes: START valsartan 40 mg daily

## 2022-09-05 NOTE — Assessment & Plan Note (Signed)
Serous fluid behind left TM, but no bulging or effusion indicative of acute otitis media. Discussed using Flonase daily as well as Zyrtec for seasonal allergies. Return precautions discussed.

## 2022-09-05 NOTE — Progress Notes (Signed)
S:     Chief Complaint  Patient presents with   Medication Management    Ambulatory blood pressure follow-up   52 y.o. female who presents for hypertension evaluation, education, and management.  No significant PMH; previous history of elevated BP  Patient was referred and last seen by Primary Care Provider, Dr. Nita Sells, on 08/14/22.  At last visit, patient was experiencing allergic symptoms (sore throat, dry cough, headache, low energy) and elevated BP.   Has not been diagnosed with hypertension.  Family history of hypertension is significant with mother, father and paternal grandparents.    Medication compliance is reported to be adherent.   Discussed procedure for wearing the monitor and gave patient written instructions. Monitor was placed on non-dominant arm with instructions to return in the morning.   Current BP Medications include:  None  Antihypertensives tried in the past include: None  Day #2 - Patient returns to clinic and reports no issues tolerating the blood pressure cuff. Patient reports they were able to wear the Ambulatory Blood Pressure Cuff for the entire 24 evaluation period.   O:  Review of Systems  All other systems reviewed and are negative.   Physical Exam Constitutional:      Appearance: Normal appearance.  Pulmonary:     Effort: Pulmonary effort is normal.  Neurological:     Mental Status: She is alert.  Psychiatric:        Mood and Affect: Mood normal.        Behavior: Behavior normal.        Thought Content: Thought content normal.    Last 3 Office BP readings: BP Readings from Last 3 Encounters:  09/04/22 (!) 145/83  08/18/22 (!) 150/90  08/15/22 (!) 161/82   Clinical Atherosclerotic Cardiovascular Disease (ASCVD): No  The 10-year ASCVD risk score (Arnett DK, et al., 2019) is: 2.9%   Values used to calculate the score:     Age: 18 years     Sex: Female     Is Non-Hispanic African American: Yes     Diabetic: No     Tobacco  smoker: No     Systolic Blood Pressure: Q000111Q mmHg     Is BP treated: No     HDL Cholesterol: 58 mg/dL     Total Cholesterol: 181 mg/dL  Basic Metabolic Panel    Component Value Date/Time   NA 139 08/15/2022 1952   NA 140 09/22/2019 1409   K 3.7 08/15/2022 1952   CL 101 08/15/2022 1952   CO2 28 08/15/2022 1952   GLUCOSE 121 (H) 08/15/2022 1952   BUN 8 08/15/2022 1952   BUN 7 09/22/2019 1409   CREATININE 0.74 08/15/2022 1952   CREATININE 0.64 09/29/2014 1055   CALCIUM 9.8 08/15/2022 1952   GFRNONAA >60 08/15/2022 1952   GFRAA 119 09/22/2019 1409   Renal function: Estimated Creatinine Clearance: 110.2 mL/min (by C-G formula based on SCr of 0.74 mg/dL).  ABPM Study Data: Arm Placement left arm  Overall Mean 24hr BP:   170/90 mmHg  HR: 83  Daytime Mean BP:  175/93 mmHg  HR: 85  Nighttime Mean BP:  153/80 mmHg  HR: 77  Dipping Pattern: Yes.    Sys:   12.7%   Dia: 13.7%   [normal dipping ~10-20%]   For Office Goal Goal BP of <130/80:  ABPM thresholds: Overall BP < 125/75, daytime BP <130/80 mmHg, sleeptime BP <110/65 mmHg   A/P: Patient with elevated blood pressure readings over the last  year and a family history of hypertension. Goal blood pressure is < 130/80 mmHg. Found to have persistently elevated and uncontrolled blood pressure with 24-hour ambulatory blood pressure evaluation which demonstrates an average AWAKE blood pressure of 175/93 mmHg. Nocturnal dipping pattern is normal.   - Initiated valsartan 40 mg once daily - Counseled patient on monitoring blood pressure at home and keeping a log to bring in at next appointment - Patient educated on purpose, proper use and potential adverse effects of new antihypertensive medication.  Following instruction patient verbalized understanding of treatment plan.   Results reviewed and written information provided.    Written patient instructions provided. Patient verbalized understanding of treatment plan.  Total time in face to  face counseling 25 minutes.    Follow-up:  Pharmacist on 09/14/22 Patient seen with Louanne Belton PharmD PGY-1 Pharmacy Resident and Estelle June, PharmD Candidate.

## 2022-09-05 NOTE — Assessment & Plan Note (Signed)
Patient with elevated blood pressure readings over the last year and a family history of hypertension. Goal blood pressure is < 130/80 mmHg. Found to have persistently elevated and uncontrolled blood pressure with 24-hour ambulatory blood pressure evaluation which demonstrates an average AWAKE blood pressure of 175/93 mmHg. Nocturnal dipping pattern is normal.   - Initiated valsartan 40 mg once daily - Counseled patient on monitoring blood pressure at home and keeping a log to bring in at next appointment - Patient educated on purpose, proper use and potential adverse effects of new antihypertensive medication.  Following instruction patient verbalized understanding of treatment plan.

## 2022-09-06 NOTE — Progress Notes (Signed)
Reviewed and agree with Dr Koval's plan.   

## 2022-09-14 ENCOUNTER — Ambulatory Visit: Payer: BC Managed Care – PPO | Admitting: Pharmacist

## 2022-09-20 ENCOUNTER — Encounter: Payer: Self-pay | Admitting: Pharmacist

## 2022-09-20 ENCOUNTER — Ambulatory Visit: Payer: BC Managed Care – PPO | Admitting: Pharmacist

## 2022-09-20 VITALS — BP 143/79 | HR 71 | Wt 281.8 lb

## 2022-09-20 DIAGNOSIS — I10 Essential (primary) hypertension: Secondary | ICD-10-CM

## 2022-09-20 NOTE — Progress Notes (Signed)
S:     Chief Complaint  Patient presents with   Medication Management    HTN   52 y.o. female who presents for hypertension evaluation, education, and management.  PMH is significant for HTN.  Patient was referred and last seen by, Dr. Owens Shark, on 09/05/2022.   At last visit, Patient was initiated on valsartan 40 mg daily to treat HTN.   Today, patient arrives in good spirits and presents without assistance. Denies dizziness, headache, blurred vision, swelling. Patient reports recent back spasms that started last week and has bothered her frequently, especially if she coughs.   Medication adherence suboptimal. Patient frequently forgets to take her BP medication everyday. Reports She last took her valsartan last Thursday. Patient has taken BP medications today.   Current antihypertensives include: Valsartan 40 mg  Antihypertensives tried in the past include: HCTZ  Reported home BP readings: Patient does not measure BP at home.   O:  Review of Systems  All other systems reviewed and are negative.   Physical Exam Constitutional:      Appearance: Normal appearance.  Pulmonary:     Breath sounds: Normal breath sounds.  Neurological:     Mental Status: She is alert.  Psychiatric:        Mood and Affect: Mood normal.        Behavior: Behavior normal.        Thought Content: Thought content normal.        Judgment: Judgment normal.     Last 3 Office BP readings: BP Readings from Last 3 Encounters:  09/20/22 (!) 143/79  09/05/22 138/76  09/04/22 (!) 145/83    BMET    Component Value Date/Time   NA 139 08/15/2022 1952   NA 140 09/22/2019 1409   K 3.7 08/15/2022 1952   CL 101 08/15/2022 1952   CO2 28 08/15/2022 1952   GLUCOSE 121 (H) 08/15/2022 1952   BUN 8 08/15/2022 1952   BUN 7 09/22/2019 1409   CREATININE 0.74 08/15/2022 1952   CREATININE 0.64 09/29/2014 1055   CALCIUM 9.8 08/15/2022 1952   GFRNONAA >60 08/15/2022 1952   GFRAA 119 09/22/2019 1409     Clinical ASCVD: No  The 10-year ASCVD risk score (Arnett DK, et al., 2019) is: 4.9%   Values used to calculate the score:     Age: 57 years     Sex: Female     Is Non-Hispanic African American: Yes     Diabetic: No     Tobacco smoker: No     Systolic Blood Pressure: [id number] mmHg     Is BP treated: Yes     HDL Cholesterol: 58 mg/dL     Total Cholesterol: 181 mg/dL  A/P: Hypertension recently diagnosed who has been non-adherent with taking prescribed therapy and is currently slightly improved after taking one dose of medication within the last 24 hours. BP goal remains < 130/80 mmHg. Medication adherence appears less than ideal.  -Continued valsartan 40mg  once daily .  -Patient educated on purpose, proper use, and potential adverse effects.   Reinforced our goal of treating hypertension is the reduction of heart attacks and strokes.  -F/u labs to be assessed at next follow-up because patient has not taken a significant number of doses at this time.  -Counseled on lifestyle modifications for blood pressure control including reduced dietary sodium, increased exercise, adequate sleep.  Results reviewed and written information provided.    Written patient instructions provided. Patient verbalized understanding of treatment plan.  Total time in face to face counseling 22 minutes.    Follow-up:  Pharmacist 1 week. Patient seen with Gena Fray, PharmD PGY-1 Pharmacy Resident and Estelle June, PharmD Candidate.

## 2022-09-20 NOTE — Assessment & Plan Note (Signed)
Hypertension recently diagnosed who has been non-adherent with taking prescribed therapy and is currently slightly improved after taking one dose of medication within the last 24 hours. BP goal remains < 130/80 mmHg. Medication adherence appears less than ideal.  -Continued valsartan 40mg  once daily .  -Patient educated on purpose, proper use, and potential adverse effects.   Reinforced our goal of treating hypertension is the reduction of heart attacks and strokes.  -F/u labs to be assessed at next follow-up because patient has not taken a significant number of doses at this time.  -Counseled on lifestyle modifications for blood pressure control including reduced dietary sodium, increased exercise, adequate sleep.

## 2022-09-20 NOTE — Patient Instructions (Signed)
Nice to see you today.    Please remember to take your blood pressure medication every day to help reduce your risk of heart attack or stroke.   Looking forward to seeing you again next week.

## 2022-09-21 NOTE — Progress Notes (Signed)
Reviewed and agree with Dr Koval's plan.   

## 2022-09-28 ENCOUNTER — Encounter: Payer: Self-pay | Admitting: Pharmacist

## 2022-09-28 ENCOUNTER — Ambulatory Visit: Payer: BC Managed Care – PPO | Admitting: Pharmacist

## 2022-09-28 VITALS — BP 153/84 | HR 72 | Wt 281.2 lb

## 2022-09-28 DIAGNOSIS — I10 Essential (primary) hypertension: Secondary | ICD-10-CM

## 2022-09-28 MED ORDER — VALSARTAN-HYDROCHLOROTHIAZIDE 80-12.5 MG PO TABS: 1.0000 | ORAL_TABLET | Freq: Every day | ORAL | 3 refills | Status: DC

## 2022-09-28 NOTE — Patient Instructions (Signed)
It was nice to see you today!  Your goal blood pressure is  <130/80 mmHg.  Medication Changes: Stop valsartan 40 mg daily   Begin valsartan-hctz 80-12.5 mg daily   Monitor blood pressure at home daily and keep a log (on your phone or piece of paper) to bring with you to your next visit. Write down date, time, blood pressure and pulse.  Keep up the good work with diet and exercise. Aim for a diet full of vegetables, fruit and lean meats (chicken, [place], fish). Try to limit salt intake by eating fresh or frozen vegetables (instead of canned), rinse canned vegetables prior to cooking and do not add any additional salt to meals.

## 2022-09-28 NOTE — Assessment & Plan Note (Signed)
Hypertension longstanding currently uncontrolled on current medication. BP goal < 130/80  mmHg. Medication adherence has improved. Control is suboptimal due to elevated BP readings in-office including on repeat.  -Discontinued valsartan 40 mg daily.  -Started valsartan-HCTZ 80-12.5 mg daily as patient would benefit from single-pill regimen.Patient educated on purpose, proper use, and potential adverse effects of HCTZ.  -Counseled on lifestyle modifications for blood pressure control including reduced dietary sodium, increased exercise, adequate sleep. -Encouraged patient to check BP at home and bring log of readings to next visit. Counseled on proper use of home BP cuff.

## 2022-09-28 NOTE — Progress Notes (Signed)
.  S:     Chief Complaint  Patient presents with   Medication Management    HTN   52 y.o. female who presents for hypertension evaluation, education, and management.  PMH is significant for hypertension.  Patient was referred and last seen by Primary Care Provider, Dr. Melissa Noon, on 09/04/22. Last Rx visit on 09/20/22.  At last visit, She reports that she only took her BP med 1 time.   Today, patient arrives in good spirits and presents without assistance. Denies dizziness, headache, blurred vision, swelling. Patient has taken her medication every day except Saturday. Has been improving in incorporating this into her daily regimen. Keeps pills by her toothbrush to remember to take them.   Family history of hypertension is significant with mother, father and paternal grandparents.   Medication adherence markedly improved. Patient has taken her valsartan today. Patient does not measure blood pressure at home.  Current antihypertensives include: valsartan 40 mg daily   Antihypertensives tried in the past include: none   Patient reported dietary habits: Eats 3 meals/day - No changes to diet since last visit. She actively works to make sure she limits salt. Rinses canned veggies.   Patient-reported exercise habits: no changes to patterns since last saw. Does walks in her neighborhood  O:  Review of Systems  All other systems reviewed and are negative.   Physical Exam Vitals reviewed.  Constitutional:      Appearance: Normal appearance.  Cardiovascular:     Rate and Rhythm: Normal rate.  Musculoskeletal:        General: Normal range of motion.  Neurological:     General: No focal deficit present.     Mental Status: She is alert and oriented to person, place, and time. Mental status is at baseline.  Psychiatric:        Mood and Affect: Mood normal.        Behavior: Behavior normal.        Thought Content: Thought content normal.        Judgment: Judgment normal.     Last 3  Office BP readings: BP Readings from Last 3 Encounters:  09/28/22 (!) 159/87  09/20/22 (!) 143/79  09/05/22 138/76    BMET    Component Value Date/Time   NA 139 08/15/2022 1952   NA 140 09/22/2019 1409   K 3.7 08/15/2022 1952   CL 101 08/15/2022 1952   CO2 28 08/15/2022 1952   GLUCOSE 121 (H) 08/15/2022 1952   BUN 8 08/15/2022 1952   BUN 7 09/22/2019 1409   CREATININE 0.74 08/15/2022 1952   CREATININE 0.64 09/29/2014 1055   CALCIUM 9.8 08/15/2022 1952   GFRNONAA >60 08/15/2022 1952   GFRAA 119 09/22/2019 1409    Clinical ASCVD: No  The 10-year ASCVD risk score (Arnett DK, et al., 2019) is: 7.4%   Values used to calculate the score:     Age: 65 years     Sex: Female     Is Non-Hispanic African American: Yes     Diabetic: No     Tobacco smoker: No     Systolic Blood Pressure: 159 mmHg     Is BP treated: Yes     HDL Cholesterol: 58 mg/dL     Total Cholesterol: 181 mg/dL  Patient is participating in a Managed Medicaid Plan:  Yes    A/P: Hypertension longstanding currently uncontrolled on current medication. BP goal < 130/80  mmHg. Medication adherence has improved. Control is suboptimal due to  elevated BP readings in-office including on repeat.  -Discontinued valsartan 40 mg daily.  -Started valsartan-HCTZ 80-12.5 mg daily as patient would benefit from single-pill regimen.Patient educated on purpose, proper use, and potential adverse effects of HCTZ.  -Counseled on lifestyle modifications for blood pressure control including reduced dietary sodium, increased exercise, adequate sleep. -Encouraged patient to check BP at home and bring log of readings to next visit. Counseled on proper use of home BP cuff.   Results reviewed and written information provided.    Written patient instructions provided. Patient verbalized understanding of treatment plan.  Total time in face to face counseling 30 minutes.    Follow-up:  Pharmacist PRN PCP clinic visit in 10/27/22.  Patient  seen with Earl Gala PGY-1 Pharmacy Resident, Revonda Standard, PharmD Candidate and Valeda Malm, PharmD, PGY2 Pharmacy Resident.

## 2022-10-02 NOTE — Progress Notes (Signed)
Reviewed and agree with Dr Koval's plan.   

## 2022-10-10 ENCOUNTER — Ambulatory Visit: Payer: BC Managed Care – PPO | Admitting: Family Medicine

## 2022-10-10 VITALS — BP 146/91 | HR 79 | Ht 64.0 in | Wt 274.1 lb

## 2022-10-10 DIAGNOSIS — S060X0A Concussion without loss of consciousness, initial encounter: Secondary | ICD-10-CM

## 2022-10-10 DIAGNOSIS — I10 Essential (primary) hypertension: Secondary | ICD-10-CM | POA: Diagnosis not present

## 2022-10-10 NOTE — Progress Notes (Signed)
    SUBJECTIVE:   CHIEF COMPLAINT / HPI:   Jodi Saunders is a 52 y.o. female who presents to the [facility] clinic today to discuss the following concerns:   Head Injury States that on Saturday she was trying to put together a 4-tier shelf. The top part of the wood fell onto her head, about 2 ft fall. She awoke on Sunday and had a "knot" to the back of her head, felt sore to the touch but still felt overall okay. On Monday had a slight headache, thought it was because she hadn't eaten.   This morning when she awoke, she had a headache (to right temple), her head hurt, and vision "was blurry but not blurry". States that things seemed a little more out of focus. No double vision.   No vomiting. Not super nauseous but "I could be".  States that she has been consciously getting up slower, possibly because she feels off balance.  "I feel tired, but I know I'm not". States that she feels slow. Has never had a concussion or head injuries prior.   Has not taken any medications. Not on any blood thinners.   Hypertension Patient was last seen on 4/11 by Dr. Raymondo Band for follow up on her hypertension. Her medication was changed to Valsartan-HCTZ 80-12.5 from Valsartan 40 mg. Will check BMP today.   PERTINENT  PMH / PSH: Hypertension, anemia, hyperlipidemia  OBJECTIVE:   BP (!) 145/86   Pulse 79   Ht  (1.626 m)   Wt 274 lb 2 oz (124.3 kg)   LMP 04/25/2019   SpO2 98%   BMI 47.05 kg/m   Vitals:   10/10/22 0958 10/10/22 1032  BP: (!) 145/86 (!) 146/91  Pulse: 79   SpO2: 98%    General: NAD, pleasant, able to participate in exam Head: Normocephalic, does have very small approximately 0.5cmx0.5cm bump to occiput that is ttp without any ecchymosis or abrasion noted.  Respiratory:  normal effort Skin: warm and dry, no rashes noted Neuro: Cranial Nerves: II: Visual Fields are full. PERRL.  III,IV, VI: EOMI without ptosis or diplopia.  V: Facial sensation is symmetric to  temperature VII: Facial movement is symmetric.  VIII: hearing is intact to voice X: Palate elevates symmetrically XI: Shoulder shrug is symmetric. XII: tongue is midline without atrophy or fasciculations.  Motor: Tone is normal. Bulk is normal. 5/5 strength was present in all four extremities.  Sensory: Sensation is symmetric to light touch and temperature in the arms and legs. Cerebellar: FNF and HKS are intact bilaterally Psych: Normal affect and mood  ASSESSMENT/PLAN:   1. Hypertension, unspecified type Elevated on two checks today. On combo agent. Due for BMP. Discussed starting triple combination antihypertensive with amlodipine-valsartan-HCTZ, but patient would like to continue current regimen until she meets with her PCP in May.  Would like to discuss further with her then if blood pressure remains elevated. - Basic Metabolic Panel -Consider BP adjustment at next appointment in May if still above goal  2. Concussion without loss of consciousness, initial encounter Neuro examination was largely benign.  Given low impact injury without loss of consciousness and no blood thinners, my suspicion for cerebral bleed is low.  -Treat symptomatically: tylenol PRN, rest as needed. Able to work -Strict ED precautions discussed: Recurrent nausea vomiting, gait imbalance, worse headache of life   Sabino Dick, DO [facility]

## 2022-10-10 NOTE — Patient Instructions (Addendum)
It was wonderful to see you today.  Please bring ALL of your medications with you to every visit.   Today we talked about:  I believe you have a concussion. Take Tylenol every 6 hours as needed for pain.  If you have worsening balance, recurrent nausea/vomiting, "worst headache of your life" please go to the Emergency Department.   We are doing lab work today to check your electrolytes and kidney function since your blood pressure medication was increased last visit. I will send you a MyChart message if you have MyChart. Otherwise, I will give you a call for abnormal results or send a letter if everything returned back normal. If you don't hear from me in 2 weeks, please call the office.    Thank you for coming to your visit as scheduled. We have had a large "no-show" problem lately, and this significantly limits our ability to see and care for patients. As a friendly reminder- if you cannot make your appointment please call to cancel. We do have a no show policy for those who do not cancel within 24 hours. Our policy is that if you miss or fail to cancel an appointment within 24 hours, 3 times in a 80-month period, you may be dismissed from our clinic.   Thank you for choosing [facility].   Please call [phone] with any questions about today's appointment.  Please be sure to schedule follow up at the front  desk before you leave today.   Sabino Dick, DO PGY-3 Family Medicine

## 2022-10-11 LAB — BASIC METABOLIC PANEL
BUN/Creatinine Ratio: 10 (ref 9–23)
BUN: 8 mg/dL (ref 6–24)
CO2: 24 mmol/L (ref 20–29)
Calcium: 9.7 mg/dL (ref 8.7–10.2)
Chloride: 100 mmol/L (ref 96–106)
Creatinine, Ser: 0.78 mg/dL (ref 0.57–1.00)
Glucose: 115 mg/dL — ABNORMAL HIGH (ref 70–99)
Potassium: 4.4 mmol/L (ref 3.5–5.2)
Sodium: 140 mmol/L (ref 134–144)
eGFR: 92 mL/min/{1.73_m2} (ref >59)

## 2022-10-11 IMAGING — CT CT HEART MORP W/ CTA COR W/ SCORE W/ CA W/CM &/OR W/O CM
4 of 7 series · 8 of 20 positions shown, 9 images · IV contrast (APPLIED)
Comparison: None.
COMPARISON: None.

Addendum:
EXAM:
OVER-READ INTERPRETATION  CT CHEST

The following report is an over-read performed by radiologist Dr.
Yuriko Nissen [REDACTED] on 05/19/2021. This
over-read does not include interpretation of cardiac or coronary
anatomy or pathology. The coronary calcium score/coronary CTA
interpretation by the cardiologist is attached.
CLINICAL DATA: Chest pain
Cardiac CTA
MEDICATIONS:
Sub lingual nitro. 4 mg and lopressor 100mg
TECHNIQUE: The patient was scanned on a Siemens Force 192 scanner. Gantry
rotation speed was 250 msecs. Collimation was. 6 mm . A 120 kV
prospective scan was triggered in the ascending thoracic aorta at
140 HU's with full mA between 30-70% of the R-R interval . Average
HR during the scan was 67 bpm. The 3D data set was interpreted on a
dedicated work station using MPR, MIP and VRT modes. A total of 80
cc of contrast was used.

[Series 6: ts diast sharp · axial · 0.39mm/px · z∈[-324,-283]mm · 2 of 308 slices shown]
[im 103/308  lung]
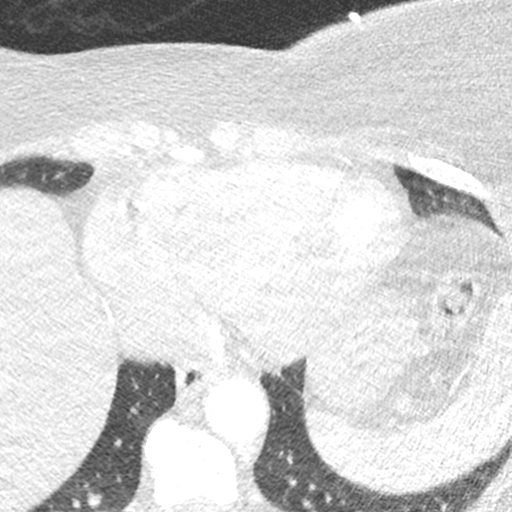
[im 205/308  lung]
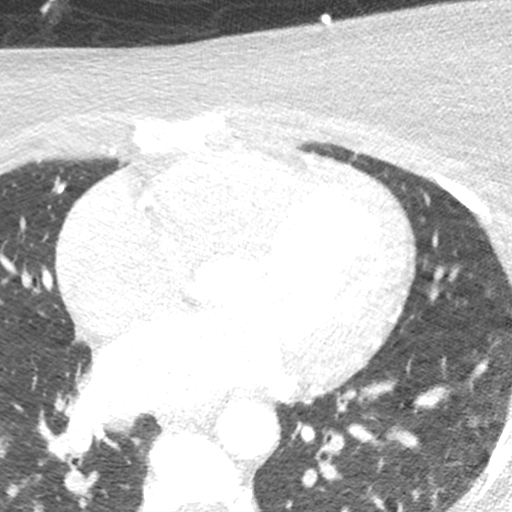

[Series 7: ts syst sharp · axial · 0.39mm/px · z∈[-324,-283]mm · 2 of 308 slices shown]
[im 103/308  lung]
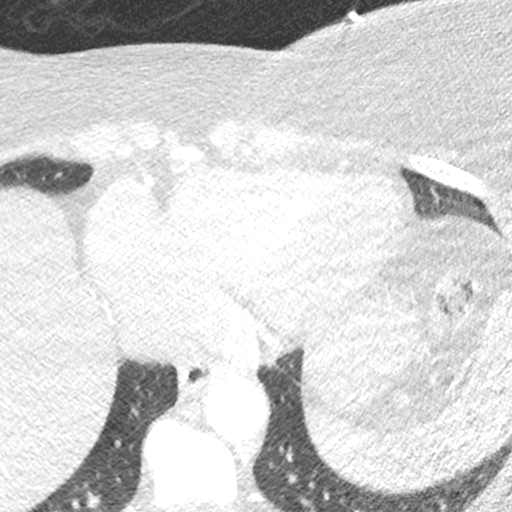
[im 205/308  lung]
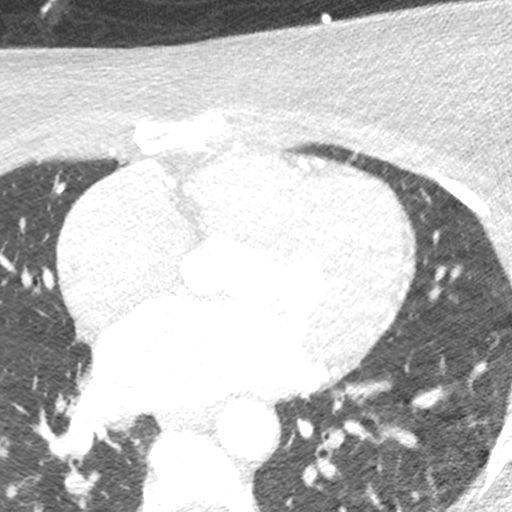

[Series 9: best syst · axial · 0.39mm/px · z∈[-324,-283]mm · 2 of 308 slices shown, 3 images]
[im 103/308  vessel]
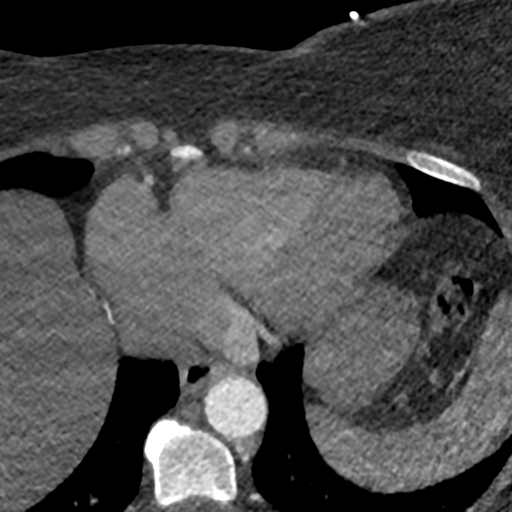
[im 103/308  lung]
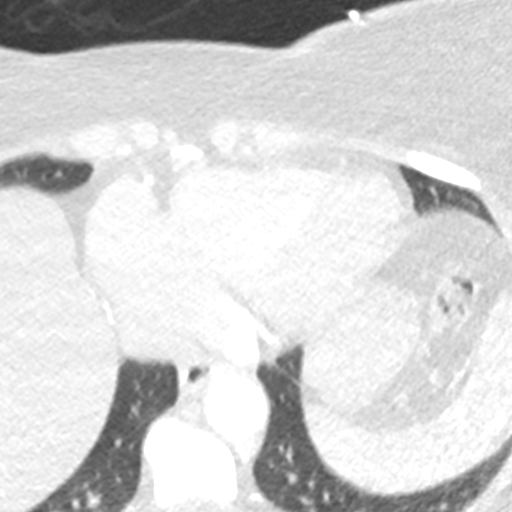
[im 205/308  vessel]
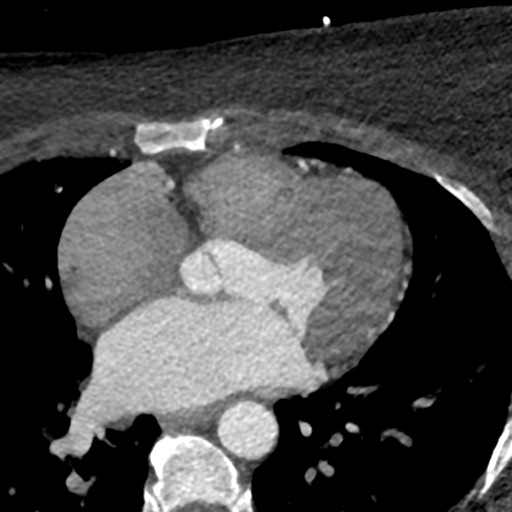

[Series 10: best diast · axial · 0.39mm/px · z∈[-324,-283]mm · 2 of 308 slices shown]
[im 103/308  vessel]
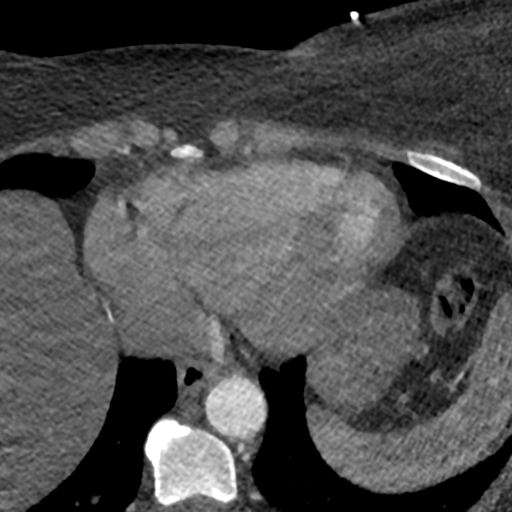
[im 205/308  vessel]
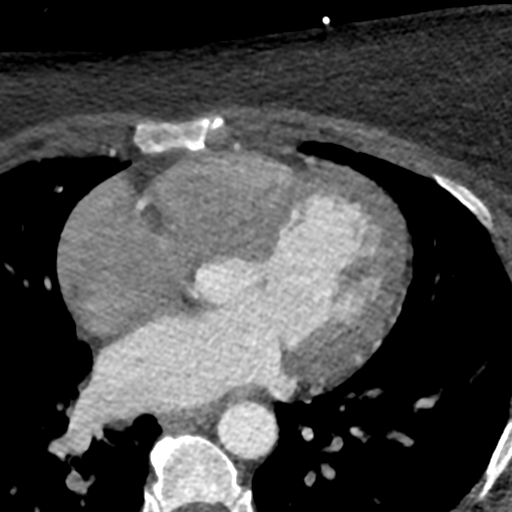

[8 of 20 positions shown; findings below may reference images not displayed]

FINDINGS: Atherosclerotic calcifications in the distal descending thoracic
aorta. Within the visualized portions of the thorax there are no
suspicious appearing pulmonary nodules or masses, there is no acute
consolidative airspace disease, no pleural effusions, no
pneumothorax and no lymphadenopathy. Visualized portions of the
upper abdomen are unremarkable. There are no aggressive appearing
lytic or blastic lesions noted in the visualized portions of the
skeleton.
IMPRESSION: 1. Aortic atherosclerosis.
FINDINGS: Non-cardiac: See separate report from [REDACTED]. No
significant findings on limited lung and soft tissue windows.

Calcium score: No calcium noted

Coronary Arteries: Right dominant with no anomalies

LM: Normal

LAD: Normal

D1: Normal

D2: Normal

Circumflex: Normal

OM1: Normal

OM2: Normal

RCA: Normal

PDA: Normal

PLA: Normal
IMPRESSION: 1. Calcium score 0

2.  Normal ascending thoracic aorta 3.1 cm

3.  Normal right dominant coronary arteries

Byron Jacinto Huert

*** End of Addendum ***
EXAM:
OVER-READ INTERPRETATION  CT CHEST

The following report is an over-read performed by radiologist Dr.
Yuriko Nissen [REDACTED] on 05/19/2021. This
over-read does not include interpretation of cardiac or coronary
anatomy or pathology. The coronary calcium score/coronary CTA
interpretation by the cardiologist is attached.
FINDINGS: Atherosclerotic calcifications in the distal descending thoracic
aorta. Within the visualized portions of the thorax there are no
suspicious appearing pulmonary nodules or masses, there is no acute
consolidative airspace disease, no pleural effusions, no
pneumothorax and no lymphadenopathy. Visualized portions of the
upper abdomen are unremarkable. There are no aggressive appearing
lytic or blastic lesions noted in the visualized portions of the
skeleton.
IMPRESSION: 1. Aortic atherosclerosis.

## 2022-10-26 ENCOUNTER — Telehealth: Payer: Self-pay

## 2022-10-26 NOTE — Progress Notes (Signed)
    SUBJECTIVE:   Chief compliant/HPI: annual examination  Jodi Saunders is a 52 y.o. who presents today for an annual exam.    Hypertension BP goal: 130/80 Current medications: valsartan-hctz Side effects: None Adherence: good  Denies HA, CP, dyspnea. Does not have home cuff  R Ankle  Reports posterior ankle pain at Achilles insertion X 6 months or more. Pain in AM or after sitting. Works at [employer], [employer]. Has tried to put on her R ankle boot on L. No prior injury. No calf pain, edema, or redness.   Urinary Incontinence Has had longstanding urinary urgency. This worsens if she drinks anything other than water. Denies symptoms today.  Reports intermittent tingling ('creepy crawling') for a few seconds on L leg. No back pain, saddle anesthesia, or other symptoms.   History tabs reviewed and updated.   Review of systems form reviewed and notable for as above.   OBJECTIVE:   BP (!) 144/82   Pulse 83   Wt 275 lb 12.8 oz (125.1 kg)   LMP 04/25/2019   SpO2 98%   BMI 47.34 kg/m   HEENT: EOMI. Sclera without injection or icterus. MMM. External auditory canal examined and WNL. TM normal appearance, no erythema or bulging. Neck: Supple.  Cardiac: Regular rate and rhythm. Normal S1/S2. No murmurs, rubs, or gallops appreciated. Lungs: Clear bilaterally to ascultation.  Abdomen: Normoactive bowel sounds. No tenderness to deep or light palpation. No rebound or guarding.   Ext: TTP along Achilles insertion No edema or warmth Full ROM No TTP along lateral/medial malleolus  Normal sensation to monofilament  Psych: Pleasant and appropriate    ASSESSMENT/PLAN:   Hypertension BP not at goal Offered triple therapy vs. Home monitoring Shared decision, will obtain home monitor follow up in 1 month   Ankle Pain Likely achilles tendinopathy Also considered OA No edema or calf symptoms suggestive of DVT X-ray, PT for eccentric training  L leg  sensation Question if peroneal nerve compression No deficits suggestive of central neurologic process B12 today   Urge Urinary Symptoms No signs of UTI, long standing Bladder diary, information on irritants give   Annual Examination  See AVS for age appropriate recommendations   Considered the following items based upon USPSTF recommendations: Diabetes screening: ordered Screening for elevated cholesterol: ordered   Cervical cancer screening: not indicated given history of hysterectomy with prior normal cytology.  Breast cancer screening: discussed potential benefits, risks including overdiagnosis and biopsy, elected proceed with mammogram Family history of colon cancer-brother diagnosed in 91s with CRC, metastatic Last CSY in 2020, repeat 5 years Will refer to GI for consult, question if more frequent screening needed    Follow up in 1 month    Veasna Santibanez Bartholome Bill, MD [facility]

## 2022-10-26 NOTE — Progress Notes (Signed)
Patient attempted to be outreached by Frederic Jericho, PharmD Candidate on 10/26/22 to discuss hypertension. Left voicemail for patient to return our call at their convenience at [phone].  Frederic Jericho, Student-PharmD

## 2022-10-27 ENCOUNTER — Other Ambulatory Visit: Payer: Self-pay

## 2022-10-27 ENCOUNTER — Ambulatory Visit: Payer: BC Managed Care – PPO | Admitting: Family Medicine

## 2022-10-27 ENCOUNTER — Ambulatory Visit
Admission: RE | Admit: 2022-10-27 | Discharge: 2022-10-27 | Disposition: A | Discharge status: Home / Self Care | Payer: BC Managed Care – PPO | Source: Ambulatory Visit | Attending: Family Medicine | Admitting: Family Medicine

## 2022-10-27 ENCOUNTER — Encounter: Payer: Self-pay | Admitting: Family Medicine

## 2022-10-27 VITALS — BP 144/82 | HR 83 | Wt 275.8 lb

## 2022-10-27 DIAGNOSIS — I10 Essential (primary) hypertension: Secondary | ICD-10-CM

## 2022-10-27 DIAGNOSIS — M79671 Pain in right foot: Secondary | ICD-10-CM

## 2022-10-27 DIAGNOSIS — R202 Paresthesia of skin: Secondary | ICD-10-CM

## 2022-10-27 DIAGNOSIS — Z23 Encounter for immunization: Secondary | ICD-10-CM | POA: Diagnosis not present

## 2022-10-27 DIAGNOSIS — R7301 Impaired fasting glucose: Secondary | ICD-10-CM

## 2022-10-27 DIAGNOSIS — Z800 Family history of malignant neoplasm of digestive organs: Secondary | ICD-10-CM

## 2022-10-27 DIAGNOSIS — Z0000 Encounter for general adult medical examination without abnormal findings: Secondary | ICD-10-CM

## 2022-10-27 DIAGNOSIS — Z1231 Encounter for screening mammogram for malignant neoplasm of breast: Secondary | ICD-10-CM

## 2022-10-27 MED ORDER — SHINGRIX 50 MCG/0.5ML IM SUSR: 0.5000 mL | INTRAMUSCULAR | 0 refills | Status: AC

## 2022-10-27 NOTE — Assessment & Plan Note (Signed)
BP not at goal Offered triple therapy vs. Home monitoring Shared decision, will obtain home monitor follow up in 1 month

## 2022-10-27 NOTE — Patient Instructions (Addendum)
It was wonderful to see you today.  Please bring ALL of your medications with you to every visit.   Today we talked about:    Checking your blood work--I will message you with results  I have referred you to Gastroenterology and PT  to further evaluate your concern. If you do not received a phone call about this appointment within 2 weeks, please call our office back at [phone]. Jazmin Hartsell coordinates our referrals and can assist you in this.    I recommend an x-ray for your ankle  An x-ray was ordered for you---you do not need an appointment to have this completed.  I recommend going to [facility] [address] [place]  If the results are normal,I will send you a letter  I will call you with results if anything is abnormal     For your urinary symptoms, I recommend a diary of symptoms I also recommend avoiding bladder irritants like caffeine, tomatoes, chocolate, citrus, and alcohol  Congratulations on your marriage!   I recommend you undergo a mammogram.   You can call to schedule an appointment by calling [phone].   Directions [address] [place], [place] [address]  Please let me know if you have questions. I will send you a letter or call you with results.     Please follow up in 3 months   Thank you for choosing [facility].   Please call [phone] with any questions about today's appointment.  Please be sure to schedule follow up at the front  desk before you leave today.   Terisa Starr, MD  Family Medicine

## 2022-10-28 LAB — LIPID PANEL
Chol/HDL Ratio: 3.4 ratio (ref 0.0–4.4)
Cholesterol, Total: 179 mg/dL (ref 100–199)
HDL: 52 mg/dL (ref >39)
LDL Chol Calc (NIH): 108 mg/dL — ABNORMAL HIGH (ref 0–99)
Triglycerides: 104 mg/dL (ref 0–149)
VLDL Cholesterol Cal: 19 mg/dL (ref 5–40)

## 2022-10-28 LAB — VITAMIN B12: Vitamin B-12: 362 pg/mL (ref 232–1245)

## 2022-10-28 LAB — HEMOGLOBIN A1C
Est. average glucose Bld gHb Est-mCnc: 134 mg/dL
Hgb A1c MFr Bld: 6.3 % — ABNORMAL HIGH (ref 4.8–5.6)

## 2022-10-28 LAB — TSH RFX ON ABNORMAL TO FREE T4: TSH: 1.34 u[IU]/mL (ref 0.450–4.500)

## 2022-11-24 NOTE — Therapy (Unsigned)
OUTPATIENT PHYSICAL THERAPY LOWER EXTREMITY EVALUATION   Patient Name: Jodi Saunders MRN: [id number] DOB:1970-11-05, 52 y.o., female Today's Date: 11/24/2022  END OF SESSION:   Past Medical History:  Diagnosis Date   Abnormal uterine bleeding 04/21/2013   Allergy    Anemia    Aortic atherosclerosis (HCC) 05/2021   Chest pain    Hypertension    Multiple allergies    Past Surgical History:  Procedure Laterality Date   ABDOMINAL HYSTERECTOMY     APPENDECTOMY     BREAST BIOPSY Left 08/2016   CHOLECYSTECTOMY  2011   CYSTOSCOPY Bilateral 05/13/2019   Procedure: CYSTOSCOPY;  Surgeon: Willodean Rosenthal, MD;  Location: [facility];  Service: Gynecology;  Laterality: Bilateral;   HYSTEROSCOPY WITH NOVASURE  06/03/2012   Procedure: HYSTEROSCOPY WITH NOVASURE;  Surgeon: Catalina Antigua, MD;  Location: [facility];  Service: Gynecology;  Laterality: N/A;   LAPAROSCOPIC APPENDECTOMY N/A 12/14/2015   Procedure: APPENDECTOMY LAPAROSCOPIC CONVERTED TO OPEN;  Surgeon: Chevis Pretty III, MD;  Location: [facility];  Service: General;  Laterality: N/A;   MULTIPLE TOOTH EXTRACTIONS     ORIF RADIUS & ULNA FRACTURES  2002   ROBOTIC ASSISTED TOTAL HYSTERECTOMY Bilateral 05/13/2019   Procedure: XI ROBOTIC ASSISTED TOTAL HYSTERECTOMY WITH SALPINGECTOMY, LYSIS OF ADHESION;  Surgeon: Willodean Rosenthal, MD;  Location: [facility];  Service: Gynecology;  Laterality: Bilateral;   TONSILLECTOMY  2008   VAGINAL DELIVERY  1992   WISDOM TOOTH EXTRACTION     Patient Active Problem List   Diagnosis Date Noted   Hypertension 09/04/2022   Aortic atherosclerosis (HCC) 05/20/2021   Hyperplastic polyp of ascending colon 12/02/2018   Hyperlipidemia 03/19/2018   Congenital pes planus 11/19/2007   Severe obesity (HCC) 08/16/2006   Anemia 08/16/2006    PCP: Westley Chandler, MD  REFERRING PROVIDER: Westley Chandler, MD  REFERRING DIAG: [phone] (ICD-10-CM) - Right foot  pain  THERAPY DIAG:  No diagnosis found.  Rationale for Evaluation and Treatment: Rehabilitation  ONSET DATE: ***  SUBJECTIVE:   SUBJECTIVE STATEMENT: ***  PERTINENT HISTORY: *** PAIN:  Are you having pain? {OPRCPAIN:27236}  PRECAUTIONS: None  WEIGHT BEARING RESTRICTIONS: No  FALLS:  Has patient fallen in last 6 months? No   OCCUPATION: ***  PLOF: Independent  PATIENT GOALS: ***  NEXT MD VISIT: PRN  OBJECTIVE:   DIAGNOSTIC FINDINGS:  EXAM: RIGHT ANKLE - COMPLETE 3+ VIEW   COMPARISON:  Right tibia and fibula radiographs, 03/15/2021.   FINDINGS: No fracture or bone lesion.   Ankle joint normally spaced and aligned. Small marginal spurs are noted from the anterior posterior tissue the margins of the distal tibia. No other degenerative/arthropathic changes.   Small dorsal and moderate plantar calcaneal spurs.   Mild diffuse subcutaneous soft tissue edema.   IMPRESSION: 1. No fracture or acute finding. 2. Minor tibia talar joint degenerative changes. 3. Calcaneal spurs.     Electronically Signed   By: Amie Portland M.D.   On: 11/01/2022 13:04    PATIENT SURVEYS:  FOTO ***  MUSCLE LENGTH: Hamstrings: Right *** deg; Left *** deg Maisie Fus test: Right *** deg; Left *** deg  POSTURE: {posture:25561}  PALPATION: ***  LOWER EXTREMITY ROM:  {AROM/PROM:27142} ROM Right eval Left eval  Hip flexion    Hip extension    Hip abduction    Hip adduction    Hip internal rotation    Hip external rotation    Knee flexion    Knee extension  Ankle dorsiflexion    Ankle plantarflexion    Ankle inversion    Ankle eversion     (Blank rows = not tested)  LOWER EXTREMITY MMT:  MMT Right eval Left eval  Hip flexion    Hip extension    Hip abduction    Hip adduction    Hip internal rotation    Hip external rotation    Knee flexion    Knee extension    Ankle dorsiflexion    Ankle plantarflexion    Ankle inversion    Ankle eversion      (Blank rows = not tested)  LOWER EXTREMITY SPECIAL TESTS:  {LEspecialtests:26242}  FUNCTIONAL TESTS:  {Functional tests:24029}  GAIT: Distance walked: 68ft x2 Assistive device utilized: {Assistive devices:23999} Level of assistance: {Levels of assistance:24026} Comments: ***   TODAY'S TREATMENT:                                                                                                                              DATE: ***    PATIENT EDUCATION:  Education details: Discussed eval findings, rehab rationale and POC and patient is in agreement  Person educated: Patient Education method: Explanation Education comprehension: verbalized understanding and needs further education  HOME EXERCISE PROGRAM: ***  ASSESSMENT:  CLINICAL IMPRESSION: Patient is a *** y.o. *** who was seen today for physical therapy evaluation and treatment for ***.   OBJECTIVE IMPAIRMENTS: {opptimpairments:25111}.   ACTIVITY LIMITATIONS: {activitylimitations:27494}  PARTICIPATION LIMITATIONS: {participationrestrictions:25113}  PERSONAL FACTORS: {Personal factors:25162} are also affecting patient's functional outcome.   REHAB POTENTIAL: Good  CLINICAL DECISION MAKING: Stable/uncomplicated  EVALUATION COMPLEXITY: Low   GOALS: Goals reviewed with patient? {yes/no:20286}  SHORT TERM GOALS: Target date: *** *** Baseline: Goal status: {GOALSTATUS:25110}  2.  *** Baseline:  Goal status: {GOALSTATUS:25110}  3.  *** Baseline:  Goal status: {GOALSTATUS:25110}  4.  *** Baseline:  Goal status: {GOALSTATUS:25110}  5.  *** Baseline:  Goal status: {GOALSTATUS:25110}  6.  *** Baseline:  Goal status: {GOALSTATUS:25110}  LONG TERM GOALS: Target date: ***  *** Baseline:  Goal status: {GOALSTATUS:25110}  2.  *** Baseline:  Goal status: {GOALSTATUS:25110}  3.  *** Baseline:  Goal status: {GOALSTATUS:25110}  4.  *** Baseline:  Goal status: {GOALSTATUS:25110}  5.   *** Baseline:  Goal status: {GOALSTATUS:25110}  6.  *** Baseline:  Goal status: {GOALSTATUS:25110}   PLAN:  PT FREQUENCY: {rehab frequency:25116}  PT DURATION: {rehab duration:25117}  PLANNED INTERVENTIONS: {rehab planned interventions:25118::"Therapeutic exercises","Therapeutic activity","Neuromuscular re-education","Balance training","Gait training","Patient/Family education","Self Care","Joint mobilization"}  PLAN FOR NEXT SESSION: HEP review and update, manual techniques as appropriate, aerobic tasks, ROM and flexibility activities, strengthening and PREs, TPDN, gait and balance training as needed     Hildred Laser, PT 11/24/2022, 11:20 AM

## 2022-11-27 ENCOUNTER — Other Ambulatory Visit: Payer: Self-pay

## 2022-11-27 ENCOUNTER — Ambulatory Visit: Payer: BC Managed Care – PPO | Attending: Family Medicine

## 2022-11-27 DIAGNOSIS — G8929 Other chronic pain: Secondary | ICD-10-CM | POA: Insufficient documentation

## 2022-11-27 DIAGNOSIS — M79671 Pain in right foot: Secondary | ICD-10-CM | POA: Diagnosis present

## 2022-11-27 DIAGNOSIS — M6281 Muscle weakness (generalized): Secondary | ICD-10-CM | POA: Diagnosis present

## 2022-11-27 DIAGNOSIS — M7661 Achilles tendinitis, right leg: Secondary | ICD-10-CM | POA: Insufficient documentation

## 2022-11-29 ENCOUNTER — Ambulatory Visit: Payer: BC Managed Care – PPO

## 2022-11-29 DIAGNOSIS — G8929 Other chronic pain: Secondary | ICD-10-CM

## 2022-11-29 DIAGNOSIS — M6281 Muscle weakness (generalized): Secondary | ICD-10-CM

## 2022-11-29 DIAGNOSIS — M7661 Achilles tendinitis, right leg: Secondary | ICD-10-CM | POA: Diagnosis not present

## 2022-11-29 NOTE — Therapy (Signed)
OUTPATIENT PHYSICAL THERAPY TREATMENT NOTE   Patient Name: Jodi Saunders MRN: [id number] DOB:[birth year], 52 y.o., female Today's Date: 11/29/2022  END OF SESSION:  PT End of Session - 11/29/22 0957     Visit Number 2    Number of Visits 12    Date for PT Re-Evaluation 01/22/23    Authorization Type BCBS    PT Start Time 1000    PT Stop Time 1042    PT Time Calculation (min) 42 min    Activity Tolerance Patient tolerated treatment well;Patient limited by pain    Behavior During Therapy [facility] for tasks assessed/performed              Past Medical History:  Diagnosis Date   Abnormal uterine bleeding 04/21/2013   Allergy    Anemia    Aortic atherosclerosis (HCC) 05/2021   Chest pain    Hypertension    Multiple allergies    Past Surgical History:  Procedure Laterality Date   ABDOMINAL HYSTERECTOMY     APPENDECTOMY     BREAST BIOPSY Left 08/2016   CHOLECYSTECTOMY  2011   CYSTOSCOPY Bilateral 05/13/2019   Procedure: CYSTOSCOPY;  Surgeon: Willodean Rosenthal, MD;  Location: [facility];  Service: Gynecology;  Laterality: Bilateral;   HYSTEROSCOPY WITH NOVASURE  06/03/2012   Procedure: HYSTEROSCOPY WITH NOVASURE;  Surgeon: Catalina Antigua, MD;  Location: [facility];  Service: Gynecology;  Laterality: N/A;   LAPAROSCOPIC APPENDECTOMY N/A 12/14/2015   Procedure: APPENDECTOMY LAPAROSCOPIC CONVERTED TO OPEN;  Surgeon: Chevis Pretty III, MD;  Location: [facility];  Service: General;  Laterality: N/A;   MULTIPLE TOOTH EXTRACTIONS     ORIF RADIUS & ULNA FRACTURES  2002   ROBOTIC ASSISTED TOTAL HYSTERECTOMY Bilateral 05/13/2019   Procedure: XI ROBOTIC ASSISTED TOTAL HYSTERECTOMY WITH SALPINGECTOMY, LYSIS OF ADHESION;  Surgeon: Willodean Rosenthal, MD;  Location: [facility];  Service: Gynecology;  Laterality: Bilateral;   TONSILLECTOMY  2008   VAGINAL DELIVERY  1992   WISDOM TOOTH EXTRACTION     Patient Active Problem List   Diagnosis Date  Noted   Hypertension 09/04/2022   Aortic atherosclerosis (HCC) 05/20/2021   Hyperplastic polyp of ascending colon 12/02/2018   Hyperlipidemia 03/19/2018   Congenital pes planus 11/19/2007   Severe obesity (HCC) 08/16/2006   Anemia 08/16/2006    PCP: Westley Chandler, MD  REFERRING PROVIDER: Westley Chandler, MD  REFERRING DIAG: [phone] (ICD-10-CM) - Right foot pain  THERAPY DIAG:  Achilles tendinitis, right leg  Muscle weakness (generalized)  Heel pain, chronic, right  Rationale for Evaluation and Treatment: Rehabilitation  ONSET DATE: 6-7 mos ago  SUBJECTIVE:   SUBJECTIVE STATEMENT: Patient reports that heel wedge has helped her pain, stating that it is a 6/10 today.   PERTINENT HISTORY:  Ankle Pain Likely achilles tendinopathy Also considered OA No edema or calf symptoms suggestive of DVT X-ray, PT for eccentric training PAIN:  Are you having pain? Yes: NPRS scale: 6/10 Pain location: R ankle Pain description: ache Aggravating factors: prolonged positioning Relieving factors: undetermined  PRECAUTIONS: None  WEIGHT BEARING RESTRICTIONS: No  FALLS:  Has patient fallen in last 6 months? No   OCCUPATION: HR  PLOF: Independent  PATIENT GOALS: to reduce and manage my ankle pain  NEXT MD VISIT: PRN  OBJECTIVE:   DIAGNOSTIC FINDINGS:  EXAM: RIGHT ANKLE - COMPLETE 3+ VIEW   COMPARISON:  Right tibia and fibula radiographs, 03/15/2021.   FINDINGS: No fracture or bone lesion.   Ankle joint  normally spaced and aligned. Small marginal spurs are noted from the anterior posterior tissue the margins of the distal tibia. No other degenerative/arthropathic changes.   Small dorsal and moderate plantar calcaneal spurs.   Mild diffuse subcutaneous soft tissue edema.   IMPRESSION: 1. No fracture or acute finding. 2. Minor tibia talar joint degenerative changes. 3. Calcaneal spurs.     Electronically Signed   By: Amie Portland M.D.   On: 11/01/2022  13:04    PATIENT SURVEYS:  FOTO 39(63 predicted)  MUSCLE LENGTH: NT  POSTURE: No Significant postural limitations  PALPATION: TTP achilles insertion  LOWER EXTREMITY ROM:  A/PROM Right eval Left eval  Hip flexion    Hip extension    Hip abduction    Hip adduction    Hip internal rotation    Hip external rotation    Knee flexion    Knee extension    Ankle dorsiflexion 15/18d 18d  Ankle plantarflexion [facility] WFL  Ankle inversion [facility] WFL  Ankle eversion WFL WFL   (Blank rows = not tested)  LOWER EXTREMITY MMT:  MMT Right eval Left eval  Hip flexion    Hip extension    Hip abduction    Hip adduction    Hip internal rotation    Hip external rotation    Knee flexion    Knee extension    Ankle dorsiflexion 4 5  Ankle plantarflexion 4- 5  Ankle inversion 4 5  Ankle eversion 4 5   (Blank rows = not tested)  LOWER EXTREMITY SPECIAL TESTS:  Ankle special tests: Windlass test negative  FUNCTIONAL TESTS:  5 times sit to stand: 18s arms crossed  GAIT: Distance walked: 52ft x2 Assistive device utilized: None Level of assistance: Complete Independence Comments: decreased R push off   TODAY'S TREATMENT:      [facility] Adult PT Treatment:                                                DATE: 11/29/22 Therapeutic Exercise: Nustep level 5 x 5 mins Slant board gastroc stretch 2x1' Eccentric heel raise with tennis ball btw heels 2x10 Toe raises back against wall 2x10 Seated PF stretch Rt 2x30" Seated towel scrunches Rt 2x1' Seated inv/ev towel slides 2x1' Rt  Seated 4 way ankle Rt RTB 2x10                                                                                                                           DATE: 11/27/22 Eval    PATIENT EDUCATION:  Education details: Discussed eval findings, rehab rationale and POC and patient is in agreement  Person educated: Patient Education method: Explanation Education comprehension: verbalized understanding and needs  further education  HOME EXERCISE PROGRAM: Access Code: [email or web address] URL: [email or web address] Date: 11/27/2022 Prepared by: Gustavus Bryant  Exercises - Long Sitting  Plantar Fascia Stretch with Towel  - 2 x daily - 5 x weekly - 1 sets - 3 reps - 30s hold - Standing Eccentric Heel Raise  - 2 x daily - 5 x weekly - 1 sets - 10 reps  ASSESSMENT:  CLINICAL IMPRESSION: Patient presents to first follow up PT session reporting improvements in her pain, stating that the heel lift provided at first session has been helpful. She has not completed her HEP yet. Session today focused on stretching and strengthening for calf muscles. Patient interested in [facility] for calf muscles, unable to perform today due to clothing restriction. She has difficulty with towel scrunches and displays flat feet BIL. Patient was able to tolerate all prescribed exercises with no adverse effects. Patient continues to benefit from skilled PT services and should be progressed as able to improve functional independence.   EVAL-Patient is a 52 y.o. female who was seen today for physical therapy evaluation and treatment for chronic R achilles tendinosis.  Patient presents with good AROM through R ankle, PF strength limited by pain, TTP at R achilles insertion.  STS time increased due to symptoms.  Windlass test negative.   Patient demonstrating chronic R achilles tendinopathy.  She will consider a walking boot and reach out to MD.  Heel lift issued as well as HEP for stretch and eccentric training.  Rehab potential is good to guarded based on chronicity.  OBJECTIVE IMPAIRMENTS: Abnormal gait, decreased knowledge of condition, decreased mobility, difficulty walking, decreased strength, obesity, and pain.   ACTIVITY LIMITATIONS: sitting, standing, and prolonged positioning  PERSONAL FACTORS: Age, Fitness, and Time since onset of injury/illness/exacerbation are also affecting patient's functional outcome.   REHAB POTENTIAL:  Good  CLINICAL DECISION MAKING: Stable/uncomplicated  EVALUATION COMPLEXITY: Low   GOALS: Goals reviewed with patient? No  SHORT TERM GOALS: Target date: 12/18/2022   Patient to demonstrate independence in HEP  Baseline: [id number] Goal status: INITIAL  2.  Patient to obtain CAM boot Baseline: TBD Goal status: INITIAL   LONG TERM GOALS: Target date: 01/08/2023    Decrease pain to 4/10 Baseline: 10/10 Goal status: INITIAL  2.  Increase FOTO score to 63 Baseline: 39 Goal status: INITIAL  3.  Increase R PF strength to 4/5 Baseline: 4-/5 Goal status: INITIAL  4.  Decrease 5x STS time to 15s arms crossed Baseline: 17s arms crossed Goal status: INITIAL   PLAN:  PT FREQUENCY: 1-2x/week  PT DURATION: 6 weeks  PLANNED INTERVENTIONS: Therapeutic exercises, Therapeutic activity, Neuromuscular re-education, Balance training, Gait training, Patient/Family education, Self Care, Joint mobilization, Stair training, DME instructions, Dry Needling, Electrical stimulation, Cryotherapy, Moist heat, Vasopneumatic device, Ultrasound, Ionotophoresis 4mg /ml Dexamethasone, Manual therapy, and Re-evaluation  PLAN FOR NEXT SESSION: HEP review and update, manual techniques as appropriate, aerobic tasks, ROM and flexibility activities, strengthening and PREs, TPDN, gait and balance training as needed, consider IASTM     Berta Minor, PTA 11/29/2022, 10:41 AM

## 2022-12-05 NOTE — Therapy (Unsigned)
OUTPATIENT PHYSICAL THERAPY TREATMENT NOTE   Patient Name: Jodi Saunders MRN: [id number] DOB:02/20/1971, 52 y.o., female Today's Date: 11/29/2022  END OF SESSION:  PT End of Session - 11/29/22 0957     Visit Number 2    Number of Visits 12    Date for PT Re-Evaluation 01/22/23    Authorization Type BCBS    PT Start Time 1000    PT Stop Time 1042    PT Time Calculation (min) 42 min    Activity Tolerance Patient tolerated treatment well;Patient limited by pain    Behavior During Therapy [facility] for tasks assessed/performed              Past Medical History:  Diagnosis Date   Abnormal uterine bleeding 04/21/2013   Allergy    Anemia    Aortic atherosclerosis (HCC) 05/2021   Chest pain    Hypertension    Multiple allergies    Past Surgical History:  Procedure Laterality Date   ABDOMINAL HYSTERECTOMY     APPENDECTOMY     BREAST BIOPSY Left 08/2016   CHOLECYSTECTOMY  2011   CYSTOSCOPY Bilateral 05/13/2019   Procedure: CYSTOSCOPY;  Surgeon: Harraway-Smith, Carolyn, MD;  Location: [facility];  Service: Gynecology;  Laterality: Bilateral;   HYSTEROSCOPY WITH NOVASURE  06/03/2012   Procedure: HYSTEROSCOPY WITH NOVASURE;  Surgeon: Peggy Constant, MD;  Location: [facility];  Service: Gynecology;  Laterality: N/A;   LAPAROSCOPIC APPENDECTOMY N/A 12/14/2015   Procedure: APPENDECTOMY LAPAROSCOPIC CONVERTED TO OPEN;  Surgeon: Paul Toth III, MD;  Location: [facility];  Service: General;  Laterality: N/A;   MULTIPLE TOOTH EXTRACTIONS     ORIF RADIUS & ULNA FRACTURES  2002   ROBOTIC ASSISTED TOTAL HYSTERECTOMY Bilateral 05/13/2019   Procedure: XI ROBOTIC ASSISTED TOTAL HYSTERECTOMY WITH SALPINGECTOMY, LYSIS OF ADHESION;  Surgeon: Harraway-Smith, Carolyn, MD;  Location: [facility];  Service: Gynecology;  Laterality: Bilateral;   TONSILLECTOMY  2008   VAGINAL DELIVERY  1992   WISDOM TOOTH EXTRACTION     Patient Active Problem List   Diagnosis Date  Noted   Hypertension 09/04/2022   Aortic atherosclerosis (HCC) 05/20/2021   Hyperplastic polyp of ascending colon 12/02/2018   Hyperlipidemia 03/19/2018   Congenital pes planus 11/19/2007   Severe obesity (HCC) 08/16/2006   Anemia 08/16/2006    PCP: Brown, Carina M, MD  REFERRING PROVIDER: Brown, Carina M, MD  REFERRING DIAG: M79.671 (ICD-10-CM) - Right foot pain  THERAPY DIAG:  Achilles tendinitis, right leg  Muscle weakness (generalized)  Heel pain, chronic, right  Rationale for Evaluation and Treatment: Rehabilitation  ONSET DATE: 6-7 mos ago  SUBJECTIVE:   SUBJECTIVE STATEMENT: Patient reports that heel wedge has helped her pain, stating that it is a 6/10 today.   PERTINENT HISTORY:  Ankle Pain Likely achilles tendinopathy Also considered OA No edema or calf symptoms suggestive of DVT X-ray, PT for eccentric training PAIN:  Are you having pain? Yes: NPRS scale: 6/10 Pain location: R ankle Pain description: ache Aggravating factors: prolonged positioning Relieving factors: undetermined  PRECAUTIONS: None  WEIGHT BEARING RESTRICTIONS: No  FALLS:  Has patient fallen in last 6 months? No   OCCUPATION: HR  PLOF: Independent  PATIENT GOALS: to reduce and manage my ankle pain  NEXT MD VISIT: PRN  OBJECTIVE:   DIAGNOSTIC FINDINGS:  EXAM: RIGHT ANKLE - COMPLETE 3+ VIEW   COMPARISON:  Right tibia and fibula radiographs, 03/15/2021.   FINDINGS: No fracture or bone lesion.   Ankle joint   normally spaced and aligned. Small marginal spurs are noted from the anterior posterior tissue the margins of the distal tibia. No other degenerative/arthropathic changes.   Small dorsal and moderate plantar calcaneal spurs.   Mild diffuse subcutaneous soft tissue edema.   IMPRESSION: 1. No fracture or acute finding. 2. Minor tibia talar joint degenerative changes. 3. Calcaneal spurs.     Electronically Signed   By: David  Ormond M.D.   On: 11/01/2022  13:04    PATIENT SURVEYS:  FOTO 39(63 predicted)  MUSCLE LENGTH: NT  POSTURE: No Significant postural limitations  PALPATION: TTP achilles insertion  LOWER EXTREMITY ROM:  A/PROM Right eval Left eval  Hip flexion    Hip extension    Hip abduction    Hip adduction    Hip internal rotation    Hip external rotation    Knee flexion    Knee extension    Ankle dorsiflexion 15/18d 18d  Ankle plantarflexion WFL WFL  Ankle inversion WFL WFL  Ankle eversion WFL WFL   (Blank rows = not tested)  LOWER EXTREMITY MMT:  MMT Right eval Left eval  Hip flexion    Hip extension    Hip abduction    Hip adduction    Hip internal rotation    Hip external rotation    Knee flexion    Knee extension    Ankle dorsiflexion 4 5  Ankle plantarflexion 4- 5  Ankle inversion 4 5  Ankle eversion 4 5   (Blank rows = not tested)  LOWER EXTREMITY SPECIAL TESTS:  Ankle special tests: Windlass test negative  FUNCTIONAL TESTS:  5 times sit to stand: 18s arms crossed  GAIT: Distance walked: 75ft x2 Assistive device utilized: None Level of assistance: Complete Independence Comments: decreased R push off   TODAY'S TREATMENT:      [facility] Adult PT Treatment:                                                DATE: 11/29/22 Therapeutic Exercise: Nustep level 5 x 5 mins Slant board gastroc stretch 2x1' Eccentric heel raise with tennis ball btw heels 2x10 Toe raises back against wall 2x10 Seated PF stretch Rt 2x30" Seated towel scrunches Rt 2x1' Seated inv/ev towel slides 2x1' Rt  Seated 4 way ankle Rt RTB 2x10                                                                                                                           DATE: 11/27/22 Eval    PATIENT EDUCATION:  Education details: Discussed eval findings, rehab rationale and POC and patient is in agreement  Person educated: Patient Education method: Explanation Education comprehension: verbalized understanding and needs  further education  HOME EXERCISE PROGRAM: Access Code: [email or web address] URL: [email or web address] Date: 11/27/2022 Prepared by: Vanice Rappa  Exercises - Long Sitting   Plantar Fascia Stretch with Towel  - 2 x daily - 5 x weekly - 1 sets - 3 reps - 30s hold - Standing Eccentric Heel Raise  - 2 x daily - 5 x weekly - 1 sets - 10 reps  ASSESSMENT:  CLINICAL IMPRESSION: Patient presents to first follow up PT session reporting improvements in her pain, stating that the heel lift provided at first session has been helpful. She has not completed her HEP yet. Session today focused on stretching and strengthening for calf muscles. Patient interested in [facility] for calf muscles, unable to perform today due to clothing restriction. She has difficulty with towel scrunches and displays flat feet BIL. Patient was able to tolerate all prescribed exercises with no adverse effects. Patient continues to benefit from skilled PT services and should be progressed as able to improve functional independence.   EVAL-Patient is a 52 y.o. female who was seen today for physical therapy evaluation and treatment for chronic R achilles tendinosis.  Patient presents with good AROM through R ankle, PF strength limited by pain, TTP at R achilles insertion.  STS time increased due to symptoms.  Windlass test negative.   Patient demonstrating chronic R achilles tendinopathy.  She will consider a walking boot and reach out to MD.  Heel lift issued as well as HEP for stretch and eccentric training.  Rehab potential is good to guarded based on chronicity.  OBJECTIVE IMPAIRMENTS: Abnormal gait, decreased knowledge of condition, decreased mobility, difficulty walking, decreased strength, obesity, and pain.   ACTIVITY LIMITATIONS: sitting, standing, and prolonged positioning  PERSONAL FACTORS: Age, Fitness, and Time since onset of injury/illness/exacerbation are also affecting patient's functional outcome.   REHAB POTENTIAL:  Good  CLINICAL DECISION MAKING: Stable/uncomplicated  EVALUATION COMPLEXITY: Low   GOALS: Goals reviewed with patient? No  SHORT TERM GOALS: Target date: 12/18/2022   Patient to demonstrate independence in HEP  Baseline: [id number] Goal status: INITIAL  2.  Patient to obtain CAM boot Baseline: TBD Goal status: INITIAL   LONG TERM GOALS: Target date: 01/08/2023    Decrease pain to 4/10 Baseline: 10/10 Goal status: INITIAL  2.  Increase FOTO score to 63 Baseline: 39 Goal status: INITIAL  3.  Increase R PF strength to 4/5 Baseline: 4-/5 Goal status: INITIAL  4.  Decrease 5x STS time to 15s arms crossed Baseline: 17s arms crossed Goal status: INITIAL   PLAN:  PT FREQUENCY: 1-2x/week  PT DURATION: 6 weeks  PLANNED INTERVENTIONS: Therapeutic exercises, Therapeutic activity, Neuromuscular re-education, Balance training, Gait training, Patient/Family education, Self Care, Joint mobilization, Stair training, DME instructions, Dry Needling, Electrical stimulation, Cryotherapy, Moist heat, Vasopneumatic device, Ultrasound, Ionotophoresis 4mg/ml Dexamethasone, Manual therapy, and Re-evaluation  PLAN FOR NEXT SESSION: HEP review and update, manual techniques as appropriate, aerobic tasks, ROM and flexibility activities, strengthening and PREs, TPDN, gait and balance training as needed, consider IASTM     Stephanie Williams, PTA 11/29/2022, 10:41 AM   

## 2022-12-07 ENCOUNTER — Ambulatory Visit: Payer: BC Managed Care – PPO

## 2022-12-07 DIAGNOSIS — M7661 Achilles tendinitis, right leg: Secondary | ICD-10-CM | POA: Diagnosis not present

## 2022-12-07 DIAGNOSIS — G8929 Other chronic pain: Secondary | ICD-10-CM

## 2022-12-07 DIAGNOSIS — M6281 Muscle weakness (generalized): Secondary | ICD-10-CM

## 2022-12-08 NOTE — Therapy (Unsigned)
OUTPATIENT PHYSICAL THERAPY TREATMENT NOTE   Patient Name: Jodi Saunders MRN: [id number] DOB:1970/08/05, 52 y.o., female Today's Date: 12/11/2022  END OF SESSION:  PT End of Session - 12/11/22 0828     Visit Number 4    Number of Visits 12    Date for PT Re-Evaluation 01/22/23    Authorization Type BCBS    PT Start Time 0830    PT Stop Time 0910    PT Time Calculation (min) 40 min    Activity Tolerance Patient tolerated treatment well;Patient limited by pain    Behavior During Therapy [facility] for tasks assessed/performed              Past Medical History:  Diagnosis Date   Abnormal uterine bleeding 04/21/2013   Allergy    Anemia    Aortic atherosclerosis (HCC) 05/2021   Chest pain    Hypertension    Multiple allergies    Past Surgical History:  Procedure Laterality Date   ABDOMINAL HYSTERECTOMY     APPENDECTOMY     BREAST BIOPSY Left 08/2016   CHOLECYSTECTOMY  2011   CYSTOSCOPY Bilateral 05/13/2019   Procedure: CYSTOSCOPY;  Surgeon: Willodean Rosenthal, MD;  Location: [facility] [place];  Service: Gynecology;  Laterality: Bilateral;   HYSTEROSCOPY WITH NOVASURE  06/03/2012   Procedure: HYSTEROSCOPY WITH NOVASURE;  Surgeon: Catalina Antigua, MD;  Location: [facility];  Service: Gynecology;  Laterality: N/A;   LAPAROSCOPIC APPENDECTOMY N/A 12/14/2015   Procedure: APPENDECTOMY LAPAROSCOPIC CONVERTED TO OPEN;  Surgeon: Chevis Pretty III, MD;  Location: [facility];  Service: General;  Laterality: N/A;   MULTIPLE TOOTH EXTRACTIONS     ORIF RADIUS & ULNA FRACTURES  2002   ROBOTIC ASSISTED TOTAL HYSTERECTOMY Bilateral 05/13/2019   Procedure: XI ROBOTIC ASSISTED TOTAL HYSTERECTOMY WITH SALPINGECTOMY, LYSIS OF ADHESION;  Surgeon: Willodean Rosenthal, MD;  Location: [facility] [place];  Service: Gynecology;  Laterality: Bilateral;   TONSILLECTOMY  2008   VAGINAL DELIVERY  1992   WISDOM TOOTH EXTRACTION     Patient Active Problem List   Diagnosis Date  Noted   Hypertension 09/04/2022   Aortic atherosclerosis (HCC) 05/20/2021   Hyperplastic polyp of ascending colon 12/02/2018   Hyperlipidemia 03/19/2018   Congenital pes planus 11/19/2007   Severe obesity (HCC) 08/16/2006   Anemia 08/16/2006    PCP: Westley Chandler, MD  REFERRING PROVIDER: Westley Chandler, MD  REFERRING DIAG: [phone] (ICD-10-CM) - Right foot pain  THERAPY DIAG:  Muscle weakness (generalized)  Heel pain, chronic, right  Achilles tendinitis, right leg  Rationale for Evaluation and Treatment: Rehabilitation  ONSET DATE: 6-7 mos ago  SUBJECTIVE:   SUBJECTIVE STATEMENT: Feels condition is slowly improving but rates worst pain at 7/10.  Heel lift helpful but benefit weaning over time   PERTINENT HISTORY:  Ankle Pain Likely achilles tendinopathy Also considered OA No edema or calf symptoms suggestive of DVT X-ray, PT for eccentric training PAIN:  Are you having pain? Yes: NPRS scale: 6/10 Pain location: R ankle Pain description: ache Aggravating factors: prolonged positioning Relieving factors: undetermined  PRECAUTIONS: None  WEIGHT BEARING RESTRICTIONS: No  FALLS:  Has patient fallen in last 6 months? No   OCCUPATION: HR  PLOF: Independent  PATIENT GOALS: to reduce and manage my ankle pain  NEXT MD VISIT: PRN  OBJECTIVE:   DIAGNOSTIC FINDINGS:  EXAM: RIGHT ANKLE - COMPLETE 3+ VIEW   COMPARISON:  Right tibia and fibula radiographs, 03/15/2021.   FINDINGS: No fracture or bone lesion.  Ankle joint normally spaced and aligned. Small marginal spurs are noted from the anterior posterior tissue the margins of the distal tibia. No other degenerative/arthropathic changes.   Small dorsal and moderate plantar calcaneal spurs.   Mild diffuse subcutaneous soft tissue edema.   IMPRESSION: 1. No fracture or acute finding. 2. Minor tibia talar joint degenerative changes. 3. Calcaneal spurs.     Electronically Signed   By: Amie Portland M.D.   On: 11/01/2022 13:04    PATIENT SURVEYS:  FOTO 39(63 predicted)  MUSCLE LENGTH: NT  POSTURE: No Significant postural limitations  PALPATION: TTP achilles insertion  LOWER EXTREMITY ROM:  A/PROM Right eval Left eval  Hip flexion    Hip extension    Hip abduction    Hip adduction    Hip internal rotation    Hip external rotation    Knee flexion    Knee extension    Ankle dorsiflexion 15/18d 18d  Ankle plantarflexion [facility] WFL  Ankle inversion [facility] WFL  Ankle eversion WFL WFL   (Blank rows = not tested)  LOWER EXTREMITY MMT:  MMT Right eval Left eval  Hip flexion    Hip extension    Hip abduction    Hip adduction    Hip internal rotation    Hip external rotation    Knee flexion    Knee extension    Ankle dorsiflexion 4 5  Ankle plantarflexion 4- 5  Ankle inversion 4 5  Ankle eversion 4 5   (Blank rows = not tested)  LOWER EXTREMITY SPECIAL TESTS:  Ankle special tests: Windlass test negative  FUNCTIONAL TESTS:  5 times sit to stand: 18s arms crossed  GAIT: Distance walked: 1ft x2 Assistive device utilized: None Level of assistance: Complete Independence Comments: decreased R push off   TODAY'S TREATMENT:      [facility] Adult PT Treatment:                                                DATE: 12/11/22 Therapeutic Exercise: Nustep level 3 x 8 mins PF stretch with towel 30s x2 Slant board gastroc/soleus stretch 30s x2 ea. Runner step 6 in 2x10 Concentric/Eccentric heel raises 2x10 1/5s ratio 4 in RLE WS onto 8 in step 15x for stretch and eccentric work  [facility] Adult PT Treatment:                                                DATE: 12/07/22 Therapeutic Exercise: Nustep level 2 x 8 mins Slant board gastroc/soleus stretch 30s x2 ea. Concentric/Eccentric heel raises 15x 1/5s ratio RLE WS onto 8 in step 15x for stretch and eccentric work Modalities: Iontophoresis 8 min R achilles insertion  [facility] Adult PT Treatment:                                                 DATE: 11/29/22 Therapeutic Exercise: Nustep level 5 x 5 mins Slant board gastroc stretch 2x1' Eccentric heel raise with tennis ball btw heels 2x10 Toe raises back against wall 2x10 Seated PF stretch Rt 2x30" Seated towel scrunches  Rt 2x1' Seated inv/ev towel slides 2x1' Rt  Seated 4 way ankle Rt RTB 2x10                                                                                                                           DATE: 11/27/22 Eval    PATIENT EDUCATION:  Education details: Discussed eval findings, rehab rationale and POC and patient is in agreement  Person educated: Patient Education method: Explanation Education comprehension: verbalized understanding and needs further education  HOME EXERCISE PROGRAM: Access Code: [email or web address] URL: [email or web address] Date: 11/27/2022 Prepared by: Gustavus Bryant  Exercises - Long Sitting Plantar Fascia Stretch with Towel  - 2 x daily - 5 x weekly - 1 sets - 3 reps - 30s hold - Standing Eccentric Heel Raise  - 2 x daily - 5 x weekly - 1 sets - 10 reps  ASSESSMENT:  CLINICAL IMPRESSION: Continued focus on eccentric strengthening, stretching of achilles gastroc/soleus complex.  Ionto initial treatment seemed beneficial, no skin irritation reported.  Increased resistance and reps as noted.  EVAL-Patient is a 52 y.o. female who was seen today for physical therapy evaluation and treatment for chronic R achilles tendinosis.  Patient presents with good AROM through R ankle, PF strength limited by pain, TTP at R achilles insertion.  STS time increased due to symptoms.  Windlass test negative.   Patient demonstrating chronic R achilles tendinopathy.  She will consider a walking boot and reach out to MD.  Heel lift issued as well as HEP for stretch and eccentric training.  Rehab potential is good to guarded based on chronicity.  OBJECTIVE IMPAIRMENTS: Abnormal gait, decreased knowledge of condition, decreased  mobility, difficulty walking, decreased strength, obesity, and pain.   ACTIVITY LIMITATIONS: sitting, standing, and prolonged positioning  PERSONAL FACTORS: Age, Fitness, and Time since onset of injury/illness/exacerbation are also affecting patient's functional outcome.   REHAB POTENTIAL: Good  CLINICAL DECISION MAKING: Stable/uncomplicated  EVALUATION COMPLEXITY: Low   GOALS: Goals reviewed with patient? No  SHORT TERM GOALS: Target date: 12/18/2022   Patient to demonstrate independence in HEP  Baseline: [id number] Goal status: Met  2.  Patient to obtain CAM boot Baseline: TBD Goal status: Ongoing   LONG TERM GOALS: Target date: 01/08/2023    Decrease pain to 4/10 Baseline: 10/10 Goal status: INITIAL  2.  Increase FOTO score to 63 Baseline: 39 Goal status: INITIAL  3.  Increase R PF strength to 4/5 Baseline: 4-/5 Goal status: INITIAL  4.  Decrease 5x STS time to 15s arms crossed Baseline: 17s arms crossed Goal status: INITIAL   PLAN:  PT FREQUENCY: 1-2x/week  PT DURATION: 6 weeks  PLANNED INTERVENTIONS: Therapeutic exercises, Therapeutic activity, Neuromuscular re-education, Balance training, Gait training, Patient/Family education, Self Care, Joint mobilization, Stair training, DME instructions, Dry Needling, Electrical stimulation, Cryotherapy, Moist heat, Vasopneumatic device, Ultrasound, Ionotophoresis 4mg /ml Dexamethasone, Manual therapy, and Re-evaluation  PLAN FOR NEXT SESSION: HEP review and update, manual techniques as appropriate, aerobic tasks, ROM  and flexibility activities, strengthening and PREs, TPDN, gait and balance training as needed, consider IASTM     Hildred Laser, PT 12/11/2022, 9:10 AM

## 2022-12-11 ENCOUNTER — Ambulatory Visit: Payer: BC Managed Care – PPO

## 2022-12-11 ENCOUNTER — Encounter: Payer: Self-pay | Admitting: Family Medicine

## 2022-12-11 DIAGNOSIS — G8929 Other chronic pain: Secondary | ICD-10-CM

## 2022-12-11 DIAGNOSIS — M7661 Achilles tendinitis, right leg: Secondary | ICD-10-CM

## 2022-12-11 DIAGNOSIS — M6281 Muscle weakness (generalized): Secondary | ICD-10-CM

## 2022-12-11 NOTE — Therapy (Unsigned)
OUTPATIENT PHYSICAL THERAPY TREATMENT NOTE   Patient Name: Jodi Saunders MRN: [id number] DOB:[birth year], 52 y.o., female Today's Date: 12/13/2022  END OF SESSION:  PT End of Session - 12/13/22 0914     Visit Number 5    Number of Visits 12    Date for PT Re-Evaluation 01/22/23    Authorization Type BCBS    PT Start Time 0915    PT Stop Time 0955    PT Time Calculation (min) 40 min    Activity Tolerance Patient tolerated treatment well;Patient limited by pain    Behavior During Therapy [facility] for tasks assessed/performed               Past Medical History:  Diagnosis Date   Abnormal uterine bleeding 04/21/2013   Allergy    Anemia    Aortic atherosclerosis (HCC) 05/2021   Chest pain    Hypertension    Multiple allergies    Past Surgical History:  Procedure Laterality Date   ABDOMINAL HYSTERECTOMY     APPENDECTOMY     BREAST BIOPSY Left 08/2016   CHOLECYSTECTOMY  2011   CYSTOSCOPY Bilateral 05/13/2019   Procedure: CYSTOSCOPY;  Surgeon: Willodean Rosenthal, MD;  Location: [facility];  Service: Gynecology;  Laterality: Bilateral;   HYSTEROSCOPY WITH NOVASURE  06/03/2012   Procedure: HYSTEROSCOPY WITH NOVASURE;  Surgeon: Catalina Antigua, MD;  Location: [facility];  Service: Gynecology;  Laterality: N/A;   LAPAROSCOPIC APPENDECTOMY N/A 12/14/2015   Procedure: APPENDECTOMY LAPAROSCOPIC CONVERTED TO OPEN;  Surgeon: Chevis Pretty III, MD;  Location: [facility];  Service: General;  Laterality: N/A;   MULTIPLE TOOTH EXTRACTIONS     ORIF RADIUS & ULNA FRACTURES  2002   ROBOTIC ASSISTED TOTAL HYSTERECTOMY Bilateral 05/13/2019   Procedure: XI ROBOTIC ASSISTED TOTAL HYSTERECTOMY WITH SALPINGECTOMY, LYSIS OF ADHESION;  Surgeon: Willodean Rosenthal, MD;  Location: [facility] [place];  Service: Gynecology;  Laterality: Bilateral;   TONSILLECTOMY  2008   VAGINAL DELIVERY  1992   WISDOM TOOTH EXTRACTION     Patient Active Problem List   Diagnosis Date  Noted   Hypertension 09/04/2022   Aortic atherosclerosis (HCC) 05/20/2021   Hyperplastic polyp of ascending colon 12/02/2018   Hyperlipidemia 03/19/2018   Congenital pes planus 11/19/2007   Severe obesity (HCC) 08/16/2006   Anemia 08/16/2006    PCP: Westley Chandler, MD  REFERRING PROVIDER: Westley Chandler, MD  REFERRING DIAG: [phone] (ICD-10-CM) - Right foot pain  THERAPY DIAG:  Muscle weakness (generalized)  Heel pain, chronic, right  Achilles tendinitis, right leg  Rationale for Evaluation and Treatment: Rehabilitation  ONSET DATE: 6-7 mos ago  SUBJECTIVE:   SUBJECTIVE STATEMENT: R achilles symptoms increased following descending 3 flights of stairs.  Has reached out to MD regarding CAM boot but has not gotten a response. Worst pain levels are down to 5-6/10   PERTINENT HISTORY:  Ankle Pain Likely achilles tendinopathy Also considered OA No edema or calf symptoms suggestive of DVT X-ray, PT for eccentric training PAIN:  Are you having pain? Yes: NPRS scale: 6/10 Pain location: R ankle Pain description: ache Aggravating factors: prolonged positioning Relieving factors: undetermined  PRECAUTIONS: None  WEIGHT BEARING RESTRICTIONS: No  FALLS:  Has patient fallen in last 6 months? No   OCCUPATION: HR  PLOF: Independent  PATIENT GOALS: to reduce and manage my ankle pain  NEXT MD VISIT: PRN  OBJECTIVE:   DIAGNOSTIC FINDINGS:  EXAM: RIGHT ANKLE - COMPLETE 3+ VIEW   COMPARISON:  Right  tibia and fibula radiographs, 03/15/2021.   FINDINGS: No fracture or bone lesion.   Ankle joint normally spaced and aligned. Small marginal spurs are noted from the anterior posterior tissue the margins of the distal tibia. No other degenerative/arthropathic changes.   Small dorsal and moderate plantar calcaneal spurs.   Mild diffuse subcutaneous soft tissue edema.   IMPRESSION: 1. No fracture or acute finding. 2. Minor tibia talar joint degenerative  changes. 3. Calcaneal spurs.     Electronically Signed   By: Amie Portland M.D.   On: 11/01/2022 13:04    PATIENT SURVEYS:  FOTO 39(63 predicted)  MUSCLE LENGTH: NT  POSTURE: No Significant postural limitations  PALPATION: TTP achilles insertion  LOWER EXTREMITY ROM:  A/PROM Right eval Left eval  Hip flexion    Hip extension    Hip abduction    Hip adduction    Hip internal rotation    Hip external rotation    Knee flexion    Knee extension    Ankle dorsiflexion 15/18d 18d  Ankle plantarflexion [facility] WFL  Ankle inversion [facility] WFL  Ankle eversion WFL WFL   (Blank rows = not tested)  LOWER EXTREMITY MMT:  MMT Right eval Left eval  Hip flexion    Hip extension    Hip abduction    Hip adduction    Hip internal rotation    Hip external rotation    Knee flexion    Knee extension    Ankle dorsiflexion 4 5  Ankle plantarflexion 4- 5  Ankle inversion 4 5  Ankle eversion 4 5   (Blank rows = not tested)  LOWER EXTREMITY SPECIAL TESTS:  Ankle special tests: Windlass test negative  FUNCTIONAL TESTS:  5 times sit to stand: 18s arms crossed  GAIT: Distance walked: 30ft x2 Assistive device utilized: None Level of assistance: Complete Independence Comments: decreased R push off   TODAY'S TREATMENT:      [facility] Adult PT Treatment:                                                DATE: 12/13/22 Therapeutic Exercise: Nustep level 4 x 8 mins PF eccentric in long sit BlaTB 5s eccentric contraction Slant board gastroc/soleus stretch 30s x2 ea. Runner step 6 in 2x15 Stp up 4 in concentric L, eccentric R 15x  Modalities: Ionotophoresis 4mg /ml Dexamethasone R achilles 8 min while monitoring patient symptoms/response  [facility] Adult PT Treatment:                                                DATE: 12/11/22 Therapeutic Exercise: Nustep level 3 x 8 mins PF stretch with towel 30s x2 Slant board gastroc/soleus stretch 30s x2 ea. Runner step 6 in 2x10 Concentric/Eccentric  heel raises 2x10 1/5s ratio 4 in RLE WS onto 8 in step 15x for stretch and eccentric work  [facility] Adult PT Treatment:                                                DATE: 12/07/22 Therapeutic Exercise: Nustep level 2 x 8 mins Slant board  gastroc/soleus stretch 30s x2 ea. Concentric/Eccentric heel raises 15x 1/5s ratio RLE WS onto 8 in step 15x for stretch and eccentric work Modalities: Iontophoresis 8 min R achilles insertion  [facility] Adult PT Treatment:                                                DATE: 11/29/22 Therapeutic Exercise: Nustep level 5 x 5 mins Slant board gastroc stretch 2x1' Eccentric heel raise with tennis ball btw heels 2x10 Toe raises back against wall 2x10 Seated PF stretch Rt 2x30" Seated towel scrunches Rt 2x1' Seated inv/ev towel slides 2x1' Rt  Seated 4 way ankle Rt RTB 2x10                                                                                                                           DATE: 11/27/22 Eval    PATIENT EDUCATION:  Education details: Discussed eval findings, rehab rationale and POC and patient is in agreement  Person educated: Patient Education method: Explanation Education comprehension: verbalized understanding and needs further education  HOME EXERCISE PROGRAM: Access Code: [email or web address] URL: [email or web address] Date: 11/27/2022 Prepared by: Gustavus Bryant  Exercises - Long Sitting Plantar Fascia Stretch with Towel  - 2 x daily - 5 x weekly - 1 sets - 3 reps - 30s hold - Standing Eccentric Heel Raise  - 2 x daily - 5 x weekly - 1 sets - 10 reps  ASSESSMENT:  CLINICAL IMPRESSION: Slowly improving symptoms.  Overall pain levels 5-6/10 at worst.  Incorporated additional eccentric training tasks and updated HEP to address progress and remaining deficits.  EVAL-Patient is a 52 y.o. female who was seen today for physical therapy evaluation and treatment for chronic R achilles tendinosis.  Patient presents with good AROM  through R ankle, PF strength limited by pain, TTP at R achilles insertion.  STS time increased due to symptoms.  Windlass test negative.   Patient demonstrating chronic R achilles tendinopathy.  She will consider a walking boot and reach out to MD.  Heel lift issued as well as HEP for stretch and eccentric training.  Rehab potential is good to guarded based on chronicity.  OBJECTIVE IMPAIRMENTS: Abnormal gait, decreased knowledge of condition, decreased mobility, difficulty walking, decreased strength, obesity, and pain.   ACTIVITY LIMITATIONS: sitting, standing, and prolonged positioning  PERSONAL FACTORS: Age, Fitness, and Time since onset of injury/illness/exacerbation are also affecting patient's functional outcome.   REHAB POTENTIAL: Good  CLINICAL DECISION MAKING: Stable/uncomplicated  EVALUATION COMPLEXITY: Low   GOALS: Goals reviewed with patient? No  SHORT TERM GOALS: Target date: 12/18/2022   Patient to demonstrate independence in HEP  Baseline: [id number] Goal status: Met  2.  Patient to obtain CAM boot Baseline: TBD Goal status: Ongoing   LONG TERM GOALS: Target date: 01/08/2023    Decrease pain to 4/10  Baseline: 10/10; 12/13/22 5-6/10 Goal status: Progressing  2.  Increase FOTO score to 63 Baseline: 39 Goal status: INITIAL  3.  Increase R PF strength to 4/5 Baseline: 4-/5 Goal status: INITIAL  4.  Decrease 5x STS time to 15s arms crossed Baseline: 17s arms crossed Goal status: INITIAL   PLAN:  PT FREQUENCY: 1-2x/week  PT DURATION: 6 weeks  PLANNED INTERVENTIONS: Therapeutic exercises, Therapeutic activity, Neuromuscular re-education, Balance training, Gait training, Patient/Family education, Self Care, Joint mobilization, Stair training, DME instructions, Dry Needling, Electrical stimulation, Cryotherapy, Moist heat, Vasopneumatic device, Ultrasound, Ionotophoresis 4mg /ml Dexamethasone, Manual therapy, and Re-evaluation  PLAN FOR NEXT SESSION: HEP  review and update, manual techniques as appropriate, aerobic tasks, ROM and flexibility activities, strengthening and PREs, TPDN, gait and balance training as needed, consider IASTM     Hildred Laser, PT 12/13/2022, 10:03 AM

## 2022-12-11 NOTE — Telephone Encounter (Signed)
Community message sent to [employer]. Will await response.   Aleida Crandell C Teesha Ohm, RN  

## 2022-12-11 NOTE — Telephone Encounter (Signed)
DME order placed. Please let patient know.  Terisa Starr, MD  Family Medicine Teaching Service

## 2022-12-13 ENCOUNTER — Ambulatory Visit: Payer: BC Managed Care – PPO

## 2022-12-13 DIAGNOSIS — M6281 Muscle weakness (generalized): Secondary | ICD-10-CM

## 2022-12-13 DIAGNOSIS — M7661 Achilles tendinitis, right leg: Secondary | ICD-10-CM | POA: Diagnosis not present

## 2022-12-13 DIAGNOSIS — G8929 Other chronic pain: Secondary | ICD-10-CM

## 2022-12-18 ENCOUNTER — Ambulatory Visit: Payer: BC Managed Care – PPO | Attending: Family Medicine

## 2022-12-18 DIAGNOSIS — G8929 Other chronic pain: Secondary | ICD-10-CM | POA: Insufficient documentation

## 2022-12-18 DIAGNOSIS — M7661 Achilles tendinitis, right leg: Secondary | ICD-10-CM | POA: Insufficient documentation

## 2022-12-18 DIAGNOSIS — M6281 Muscle weakness (generalized): Secondary | ICD-10-CM | POA: Insufficient documentation

## 2022-12-18 DIAGNOSIS — M79671 Pain in right foot: Secondary | ICD-10-CM | POA: Diagnosis present

## 2022-12-18 NOTE — Therapy (Signed)
OUTPATIENT PHYSICAL THERAPY TREATMENT NOTE   Patient Name: Jodi Saunders MRN: [id number] DOB:04/13/1971, 52 y.o., female Today's Date: 12/18/2022  END OF SESSION:  PT End of Session - 12/18/22 0824     Visit Number 6    Number of Visits 12    Date for PT Re-Evaluation 01/22/23    Authorization Type BCBS    PT Start Time 0828    PT Stop Time 0908    PT Time Calculation (min) 40 min    Activity Tolerance Patient tolerated treatment well;Patient limited by pain    Behavior During Therapy [facility] for tasks assessed/performed             Past Medical History:  Diagnosis Date   Abnormal uterine bleeding 04/21/2013   Allergy    Anemia    Aortic atherosclerosis (HCC) 05/2021   Chest pain    Hypertension    Multiple allergies    Past Surgical History:  Procedure Laterality Date   ABDOMINAL HYSTERECTOMY     APPENDECTOMY     BREAST BIOPSY Left 08/2016   CHOLECYSTECTOMY  2011   CYSTOSCOPY Bilateral 05/13/2019   Procedure: CYSTOSCOPY;  Surgeon: Willodean Rosenthal, MD;  Location: [facility];  Service: Gynecology;  Laterality: Bilateral;   HYSTEROSCOPY WITH NOVASURE  06/03/2012   Procedure: HYSTEROSCOPY WITH NOVASURE;  Surgeon: Catalina Antigua, MD;  Location: [facility];  Service: Gynecology;  Laterality: N/A;   LAPAROSCOPIC APPENDECTOMY N/A 12/14/2015   Procedure: APPENDECTOMY LAPAROSCOPIC CONVERTED TO OPEN;  Surgeon: Chevis Pretty III, MD;  Location: [facility];  Service: General;  Laterality: N/A;   MULTIPLE TOOTH EXTRACTIONS     ORIF RADIUS & ULNA FRACTURES  2002   ROBOTIC ASSISTED TOTAL HYSTERECTOMY Bilateral 05/13/2019   Procedure: XI ROBOTIC ASSISTED TOTAL HYSTERECTOMY WITH SALPINGECTOMY, LYSIS OF ADHESION;  Surgeon: Willodean Rosenthal, MD;  Location: [facility];  Service: Gynecology;  Laterality: Bilateral;   TONSILLECTOMY  2008   VAGINAL DELIVERY  1992   WISDOM TOOTH EXTRACTION     Patient Active Problem List   Diagnosis Date  Noted   Hypertension 09/04/2022   Aortic atherosclerosis (HCC) 05/20/2021   Hyperplastic polyp of ascending colon 12/02/2018   Hyperlipidemia 03/19/2018   Congenital pes planus 11/19/2007   Severe obesity (HCC) 08/16/2006   Anemia 08/16/2006    PCP: Westley Chandler, MD  REFERRING PROVIDER: Westley Chandler, MD  REFERRING DIAG: [phone] (ICD-10-CM) - Right foot pain  THERAPY DIAG:  Muscle weakness (generalized)  Achilles tendinitis, right leg  Heel pain, chronic, right  Rationale for Evaluation and Treatment: Rehabilitation  ONSET DATE: 6-7 mos ago  SUBJECTIVE:   SUBJECTIVE STATEMENT: Patient reporting continued pain   PERTINENT HISTORY:  Ankle Pain Likely achilles tendinopathy Also considered OA No edema or calf symptoms suggestive of DVT X-ray, PT for eccentric training PAIN:  Are you having pain? Yes: NPRS scale: 4.5/10 Pain location: R ankle Pain description: ache Aggravating factors: prolonged positioning Relieving factors: undetermined  PRECAUTIONS: None  WEIGHT BEARING RESTRICTIONS: No  FALLS:  Has patient fallen in last 6 months? No   OCCUPATION: HR  PLOF: Independent  PATIENT GOALS: to reduce and manage my ankle pain  NEXT MD VISIT: PRN  OBJECTIVE:   DIAGNOSTIC FINDINGS:  EXAM: RIGHT ANKLE - COMPLETE 3+ VIEW   COMPARISON:  Right tibia and fibula radiographs, 03/15/2021.   FINDINGS: No fracture or bone lesion.   Ankle joint normally spaced and aligned. Small marginal spurs are noted from the anterior posterior  tissue the margins of the distal tibia. No other degenerative/arthropathic changes.   Small dorsal and moderate plantar calcaneal spurs.   Mild diffuse subcutaneous soft tissue edema.   IMPRESSION: 1. No fracture or acute finding. 2. Minor tibia talar joint degenerative changes. 3. Calcaneal spurs.     Electronically Signed   By: Amie Portland M.D.   On: 11/01/2022 13:04    PATIENT SURVEYS:  FOTO 39(63  predicted)  MUSCLE LENGTH: NT  POSTURE: No Significant postural limitations  PALPATION: TTP achilles insertion  LOWER EXTREMITY ROM:  A/PROM Right eval Left eval  Hip flexion    Hip extension    Hip abduction    Hip adduction    Hip internal rotation    Hip external rotation    Knee flexion    Knee extension    Ankle dorsiflexion 15/18d 18d  Ankle plantarflexion [facility] WFL  Ankle inversion [facility] WFL  Ankle eversion WFL WFL   (Blank rows = not tested)  LOWER EXTREMITY MMT:  MMT Right eval Left eval  Hip flexion    Hip extension    Hip abduction    Hip adduction    Hip internal rotation    Hip external rotation    Knee flexion    Knee extension    Ankle dorsiflexion 4 5  Ankle plantarflexion 4- 5  Ankle inversion 4 5  Ankle eversion 4 5   (Blank rows = not tested)  LOWER EXTREMITY SPECIAL TESTS:  Ankle special tests: Windlass test negative  FUNCTIONAL TESTS:  5 times sit to stand: 18s arms crossed  GAIT: Distance walked: 68ft x2 Assistive device utilized: None Level of assistance: Complete Independence Comments: decreased R push off   TODAY'S TREATMENT:      [facility] Adult PT Treatment:                                                DATE: 12/18/22 Therapeutic Exercise: Nustep level 4 x 5 mins Slant board gastroc/soleus stretch x1' Runner step 6 in 2x12 BIL Step up 6 in concentric L, eccentric R 15x2 STS no UE, on Airex x5 STS with Rt foot back to encourage weight shift onto RLE x10 4" step heel raise 1s/5s ratio 2x10 Ankle AROM circles, AP, ABCs Neuromuscular Re-ed: Tandem walking at counter x4 laps fwd/bwd Tandem stance on Airex x 30" BIL FT stance on Airex with head turns x30" FT stance on Airex with reaching x30"   [facility] Adult PT Treatment:                                                DATE: 12/13/22 Therapeutic Exercise: Nustep level 4 x 8 mins PF eccentric in long sit BlaTB 5s eccentric contraction Slant board gastroc/soleus stretch 30s x2  ea. Runner step 6 in 2x15 Stp up 4 in concentric L, eccentric R 15x  Modalities: Ionotophoresis 4mg /ml Dexamethasone R achilles 8 min while monitoring patient symptoms/response  [facility] Adult PT Treatment:  DATE: 12/11/22 Therapeutic Exercise: Nustep level 3 x 8 mins PF stretch with towel 30s x2 Slant board gastroc/soleus stretch 30s x2 ea. Runner step 6 in 2x10 Concentric/Eccentric heel raises 2x10 1/5s ratio 4 in RLE WS onto 8 in step 15x for stretch and eccentric work    PATIENT EDUCATION:  Education details: Discussed eval findings, rehab rationale and POC and patient is in agreement  Person educated: Patient Education method: Explanation Education comprehension: verbalized understanding and needs further education  HOME EXERCISE PROGRAM: Access Code: [email or web address] URL: [email or web address] Date: 11/27/2022 Prepared by: Gustavus Bryant  Exercises - Long Sitting Plantar Fascia Stretch with Towel  - 2 x daily - 5 x weekly - 1 sets - 3 reps - 30s hold - Standing Eccentric Heel Raise  - 2 x daily - 5 x weekly - 1 sets - 10 reps  ASSESSMENT:  CLINICAL IMPRESSION: Patient presents to PT reporting continued ankle pain, though lessened today. Session today focused on eccentric strengthening as well as balance tasks. She had the most difficulty with backwards tandem walking, needing occasional fingertip support to maintain balance. Patient was able to tolerate all prescribed exercises with no adverse effects. Patient continues to benefit from skilled PT services and should be progressed as able to improve functional independence.    EVAL-Patient is a 52 y.o. female who was seen today for physical therapy evaluation and treatment for chronic R achilles tendinosis.  Patient presents with good AROM through R ankle, PF strength limited by pain, TTP at R achilles insertion.  STS time increased due to symptoms.  Windlass test negative.    Patient demonstrating chronic R achilles tendinopathy.  She will consider a walking boot and reach out to MD.  Heel lift issued as well as HEP for stretch and eccentric training.  Rehab potential is good to guarded based on chronicity.  OBJECTIVE IMPAIRMENTS: Abnormal gait, decreased knowledge of condition, decreased mobility, difficulty walking, decreased strength, obesity, and pain.   ACTIVITY LIMITATIONS: sitting, standing, and prolonged positioning  PERSONAL FACTORS: Age, Fitness, and Time since onset of injury/illness/exacerbation are also affecting patient's functional outcome.   REHAB POTENTIAL: Good  CLINICAL DECISION MAKING: Stable/uncomplicated  EVALUATION COMPLEXITY: Low   GOALS: Goals reviewed with patient? No  SHORT TERM GOALS: Target date: 12/18/2022   Patient to demonstrate independence in HEP  Baseline: [id number] Goal status: Met  2.  Patient to obtain CAM boot Baseline: TBD Goal status: Ongoing   LONG TERM GOALS: Target date: 01/08/2023    Decrease pain to 4/10 Baseline: 10/10; 12/13/22 5-6/10 Goal status: Progressing  2.  Increase FOTO score to 63 Baseline: 39 Goal status: INITIAL  3.  Increase R PF strength to 4/5 Baseline: 4-/5 Goal status: INITIAL  4.  Decrease 5x STS time to 15s arms crossed Baseline: 17s arms crossed Goal status: INITIAL   PLAN:  PT FREQUENCY: 1-2x/week  PT DURATION: 6 weeks  PLANNED INTERVENTIONS: Therapeutic exercises, Therapeutic activity, Neuromuscular re-education, Balance training, Gait training, Patient/Family education, Self Care, Joint mobilization, Stair training, DME instructions, Dry Needling, Electrical stimulation, Cryotherapy, Moist heat, Vasopneumatic device, Ultrasound, Ionotophoresis 4mg /ml Dexamethasone, Manual therapy, and Re-evaluation  PLAN FOR NEXT SESSION: HEP review and update, manual techniques as appropriate, aerobic tasks, ROM and flexibility activities, strengthening and PREs, TPDN, gait and  balance training as needed, consider IASTM     Berta Minor, PTA 12/18/2022, 9:09 AM

## 2022-12-19 NOTE — Therapy (Deleted)
OUTPATIENT PHYSICAL THERAPY TREATMENT NOTE   Patient Name: Jodi Saunders MRN: [id number] DOB:Oct 25, 1970, 52 y.o., female Today's Date: 12/18/2022  END OF SESSION:  PT End of Session - 12/18/22 0824     Visit Number 6    Number of Visits 12    Date for PT Re-Evaluation 01/22/23    Authorization Type BCBS    PT Start Time 0828    PT Stop Time 0908    PT Time Calculation (min) 40 min    Activity Tolerance Patient tolerated treatment well;Patient limited by pain    Behavior During Therapy [facility] for tasks assessed/performed             Past Medical History:  Diagnosis Date   Abnormal uterine bleeding 04/21/2013   Allergy    Anemia    Aortic atherosclerosis (HCC) 05/2021   Chest pain    Hypertension    Multiple allergies    Past Surgical History:  Procedure Laterality Date   ABDOMINAL HYSTERECTOMY     APPENDECTOMY     BREAST BIOPSY Left 08/2016   CHOLECYSTECTOMY  2011   CYSTOSCOPY Bilateral 05/13/2019   Procedure: CYSTOSCOPY;  Surgeon: Willodean Rosenthal, MD;  Location: [facility] [place];  Service: Gynecology;  Laterality: Bilateral;   HYSTEROSCOPY WITH NOVASURE  06/03/2012   Procedure: HYSTEROSCOPY WITH NOVASURE;  Surgeon: Catalina Antigua, MD;  Location: [facility];  Service: Gynecology;  Laterality: N/A;   LAPAROSCOPIC APPENDECTOMY N/A 12/14/2015   Procedure: APPENDECTOMY LAPAROSCOPIC CONVERTED TO OPEN;  Surgeon: Chevis Pretty III, MD;  Location: [facility];  Service: General;  Laterality: N/A;   MULTIPLE TOOTH EXTRACTIONS     ORIF RADIUS & ULNA FRACTURES  2002   ROBOTIC ASSISTED TOTAL HYSTERECTOMY Bilateral 05/13/2019   Procedure: XI ROBOTIC ASSISTED TOTAL HYSTERECTOMY WITH SALPINGECTOMY, LYSIS OF ADHESION;  Surgeon: Willodean Rosenthal, MD;  Location: [facility] ;  Service: Gynecology;  Laterality: Bilateral;   TONSILLECTOMY  2008   VAGINAL DELIVERY  1992   WISDOM TOOTH EXTRACTION     Patient Active Problem List   Diagnosis Date  Noted   Hypertension 09/04/2022   Aortic atherosclerosis (HCC) 05/20/2021   Hyperplastic polyp of ascending colon 12/02/2018   Hyperlipidemia 03/19/2018   Congenital pes planus 11/19/2007   Severe obesity (HCC) 08/16/2006   Anemia 08/16/2006    PCP: Westley Chandler, MD  REFERRING PROVIDER: Westley Chandler, MD  REFERRING DIAG: [phone] (ICD-10-CM) - Right foot pain  THERAPY DIAG:  Muscle weakness (generalized)  Achilles tendinitis, right leg  Heel pain, chronic, right  Rationale for Evaluation and Treatment: Rehabilitation  ONSET DATE: 6-7 mos ago  SUBJECTIVE:   SUBJECTIVE STATEMENT: Patient reporting continued pain   PERTINENT HISTORY:  Ankle Pain Likely achilles tendinopathy Also considered OA No edema or calf symptoms suggestive of DVT X-ray, PT for eccentric training PAIN:  Are you having pain? Yes: NPRS scale: 4.5/10 Pain location: R ankle Pain description: ache Aggravating factors: prolonged positioning Relieving factors: undetermined  PRECAUTIONS: None  WEIGHT BEARING RESTRICTIONS: No  FALLS:  Has patient fallen in last 6 months? No   OCCUPATION: HR  PLOF: Independent  PATIENT GOALS: to reduce and manage my ankle pain  NEXT MD VISIT: PRN  OBJECTIVE:   DIAGNOSTIC FINDINGS:  EXAM: RIGHT ANKLE - COMPLETE 3+ VIEW   COMPARISON:  Right tibia and fibula radiographs, 03/15/2021.   FINDINGS: No fracture or bone lesion.   Ankle joint normally spaced and aligned. Small marginal spurs are noted from the anterior posterior  tissue the margins of the distal tibia. No other degenerative/arthropathic changes.   Small dorsal and moderate plantar calcaneal spurs.   Mild diffuse subcutaneous soft tissue edema.   IMPRESSION: 1. No fracture or acute finding. 2. Minor tibia talar joint degenerative changes. 3. Calcaneal spurs.     Electronically Signed   By: Amie Portland M.D.   On: 11/01/2022 13:04    PATIENT SURVEYS:  FOTO 39(63  predicted)  MUSCLE LENGTH: NT  POSTURE: No Significant postural limitations  PALPATION: TTP achilles insertion  LOWER EXTREMITY ROM:  A/PROM Right eval Left eval  Hip flexion    Hip extension    Hip abduction    Hip adduction    Hip internal rotation    Hip external rotation    Knee flexion    Knee extension    Ankle dorsiflexion 15/18d 18d  Ankle plantarflexion [facility] WFL  Ankle inversion [facility] WFL  Ankle eversion WFL WFL   (Blank rows = not tested)  LOWER EXTREMITY MMT:  MMT Right eval Left eval  Hip flexion    Hip extension    Hip abduction    Hip adduction    Hip internal rotation    Hip external rotation    Knee flexion    Knee extension    Ankle dorsiflexion 4 5  Ankle plantarflexion 4- 5  Ankle inversion 4 5  Ankle eversion 4 5   (Blank rows = not tested)  LOWER EXTREMITY SPECIAL TESTS:  Ankle special tests: Windlass test negative  FUNCTIONAL TESTS:  5 times sit to stand: 18s arms crossed  GAIT: Distance walked: 37ft x2 Assistive device utilized: None Level of assistance: Complete Independence Comments: decreased R push off   TODAY'S TREATMENT:      [facility] Adult PT Treatment:                                                DATE: 12/18/22 Therapeutic Exercise: Nustep level 4 x 5 mins Slant board gastroc/soleus stretch x1' Runner step 6 in 2x12 BIL Step up 6 in concentric L, eccentric R 15x2 STS no UE, on Airex x5 STS with Rt foot back to encourage weight shift onto RLE x10 4" step heel raise 1s/5s ratio 2x10 Ankle AROM circles, AP, ABCs Neuromuscular Re-ed: Tandem walking at counter x4 laps fwd/bwd Tandem stance on Airex x 30" BIL FT stance on Airex with head turns x30" FT stance on Airex with reaching x30"   [facility] Adult PT Treatment:                                                DATE: 12/13/22 Therapeutic Exercise: Nustep level 4 x 8 mins PF eccentric in long sit BlaTB 5s eccentric contraction Slant board gastroc/soleus stretch 30s x2  ea. Runner step 6 in 2x15 Stp up 4 in concentric L, eccentric R 15x  Modalities: Ionotophoresis 4mg /ml Dexamethasone R achilles 8 min while monitoring patient symptoms/response  [facility] Adult PT Treatment:  DATE: 12/11/22 Therapeutic Exercise: Nustep level 3 x 8 mins PF stretch with towel 30s x2 Slant board gastroc/soleus stretch 30s x2 ea. Runner step 6 in 2x10 Concentric/Eccentric heel raises 2x10 1/5s ratio 4 in RLE WS onto 8 in step 15x for stretch and eccentric work    PATIENT EDUCATION:  Education details: Discussed eval findings, rehab rationale and POC and patient is in agreement  Person educated: Patient Education method: Explanation Education comprehension: verbalized understanding and needs further education  HOME EXERCISE PROGRAM: Access Code: [email or web address] URL: [email or web address] Date: 11/27/2022 Prepared by: Gustavus Bryant  Exercises - Long Sitting Plantar Fascia Stretch with Towel  - 2 x daily - 5 x weekly - 1 sets - 3 reps - 30s hold - Standing Eccentric Heel Raise  - 2 x daily - 5 x weekly - 1 sets - 10 reps  ASSESSMENT:  CLINICAL IMPRESSION: Patient presents to PT reporting continued ankle pain, though lessened today. Session today focused on eccentric strengthening as well as balance tasks. She had the most difficulty with backwards tandem walking, needing occasional fingertip support to maintain balance. Patient was able to tolerate all prescribed exercises with no adverse effects. Patient continues to benefit from skilled PT services and should be progressed as able to improve functional independence.    EVAL-Patient is a 52 y.o. female who was seen today for physical therapy evaluation and treatment for chronic R achilles tendinosis.  Patient presents with good AROM through R ankle, PF strength limited by pain, TTP at R achilles insertion.  STS time increased due to symptoms.  Windlass test negative.    Patient demonstrating chronic R achilles tendinopathy.  She will consider a walking boot and reach out to MD.  Heel lift issued as well as HEP for stretch and eccentric training.  Rehab potential is good to guarded based on chronicity.  OBJECTIVE IMPAIRMENTS: Abnormal gait, decreased knowledge of condition, decreased mobility, difficulty walking, decreased strength, obesity, and pain.   ACTIVITY LIMITATIONS: sitting, standing, and prolonged positioning  PERSONAL FACTORS: Age, Fitness, and Time since onset of injury/illness/exacerbation are also affecting patient's functional outcome.   REHAB POTENTIAL: Good  CLINICAL DECISION MAKING: Stable/uncomplicated  EVALUATION COMPLEXITY: Low   GOALS: Goals reviewed with patient? No  SHORT TERM GOALS: Target date: 12/18/2022   Patient to demonstrate independence in HEP  Baseline: [id number] Goal status: Met  2.  Patient to obtain CAM boot Baseline: TBD Goal status: Ongoing   LONG TERM GOALS: Target date: 01/08/2023    Decrease pain to 4/10 Baseline: 10/10; 12/13/22 5-6/10 Goal status: Progressing  2.  Increase FOTO score to 63 Baseline: 39 Goal status: INITIAL  3.  Increase R PF strength to 4/5 Baseline: 4-/5 Goal status: INITIAL  4.  Decrease 5x STS time to 15s arms crossed Baseline: 17s arms crossed Goal status: INITIAL   PLAN:  PT FREQUENCY: 1-2x/week  PT DURATION: 6 weeks  PLANNED INTERVENTIONS: Therapeutic exercises, Therapeutic activity, Neuromuscular re-education, Balance training, Gait training, Patient/Family education, Self Care, Joint mobilization, Stair training, DME instructions, Dry Needling, Electrical stimulation, Cryotherapy, Moist heat, Vasopneumatic device, Ultrasound, Ionotophoresis 4mg /ml Dexamethasone, Manual therapy, and Re-evaluation  PLAN FOR NEXT SESSION: HEP review and update, manual techniques as appropriate, aerobic tasks, ROM and flexibility activities, strengthening and PREs, TPDN, gait and  balance training as needed, consider IASTM     Berta Minor, PTA 12/18/2022, 9:09 AM

## 2022-12-20 ENCOUNTER — Ambulatory Visit: Payer: BC Managed Care – PPO

## 2022-12-24 NOTE — Therapy (Unsigned)
OUTPATIENT PHYSICAL THERAPY TREATMENT NOTE   Patient Name: Jodi Saunders MRN: [id number] DOB:1971/06/04, 52 y.o., female Today's Date: 12/25/2022  END OF SESSION:  PT End of Session - 12/25/22 0831     Visit Number 7    Number of Visits 12    Date for PT Re-Evaluation 01/22/23    Authorization Type BCBS    PT Start Time 0830    PT Stop Time 0910    PT Time Calculation (min) 40 min    Activity Tolerance Patient tolerated treatment well;Patient limited by pain    Behavior During Therapy [facility] for tasks assessed/performed              Past Medical History:  Diagnosis Date   Abnormal uterine bleeding 04/21/2013   Allergy    Anemia    Aortic atherosclerosis (HCC) 05/2021   Chest pain    Hypertension    Multiple allergies    Past Surgical History:  Procedure Laterality Date   ABDOMINAL HYSTERECTOMY     APPENDECTOMY     BREAST BIOPSY Left 08/2016   CHOLECYSTECTOMY  2011   CYSTOSCOPY Bilateral 05/13/2019   Procedure: CYSTOSCOPY;  Surgeon: Willodean Rosenthal, MD;  Location: [facility];  Service: Gynecology;  Laterality: Bilateral;   HYSTEROSCOPY WITH NOVASURE  06/03/2012   Procedure: HYSTEROSCOPY WITH NOVASURE;  Surgeon: Catalina Antigua, MD;  Location: [facility];  Service: Gynecology;  Laterality: N/A;   LAPAROSCOPIC APPENDECTOMY N/A 12/14/2015   Procedure: APPENDECTOMY LAPAROSCOPIC CONVERTED TO OPEN;  Surgeon: Chevis Pretty III, MD;  Location: [facility];  Service: General;  Laterality: N/A;   MULTIPLE TOOTH EXTRACTIONS     ORIF RADIUS & ULNA FRACTURES  2002   ROBOTIC ASSISTED TOTAL HYSTERECTOMY Bilateral 05/13/2019   Procedure: XI ROBOTIC ASSISTED TOTAL HYSTERECTOMY WITH SALPINGECTOMY, LYSIS OF ADHESION;  Surgeon: Willodean Rosenthal, MD;  Location: [facility];  Service: Gynecology;  Laterality: Bilateral;   TONSILLECTOMY  2008   VAGINAL DELIVERY  1992   WISDOM TOOTH EXTRACTION     Patient Active Problem List   Diagnosis Date  Noted   Hypertension 09/04/2022   Aortic atherosclerosis (HCC) 05/20/2021   Hyperplastic polyp of ascending colon 12/02/2018   Hyperlipidemia 03/19/2018   Congenital pes planus 11/19/2007   Severe obesity (HCC) 08/16/2006   Anemia 08/16/2006    PCP: Westley Chandler, MD  REFERRING PROVIDER: Westley Chandler, MD  REFERRING DIAG: [phone] (ICD-10-CM) - Right foot pain  THERAPY DIAG:  Muscle weakness (generalized)  Heel pain, chronic, right  Achilles tendinitis, right leg  Rationale for Evaluation and Treatment: Rehabilitation  ONSET DATE: 6-7 mos ago  SUBJECTIVE:   SUBJECTIVE STATEMENT: Feels she is making progress as she only experiences pain 2/10 when waking in the morning. Stair negotiation still tender but not painful   PERTINENT HISTORY:  Ankle Pain Likely achilles tendinopathy Also considered OA No edema or calf symptoms suggestive of DVT X-ray, PT for eccentric training PAIN:  Are you having pain? Yes: NPRS scale: 4.5/10 Pain location: R ankle Pain description: ache Aggravating factors: prolonged positioning Relieving factors: undetermined  PRECAUTIONS: None  WEIGHT BEARING RESTRICTIONS: No  FALLS:  Has patient fallen in last 6 months? No   OCCUPATION: HR  PLOF: Independent  PATIENT GOALS: to reduce and manage my ankle pain  NEXT MD VISIT: PRN  OBJECTIVE:   DIAGNOSTIC FINDINGS:  EXAM: RIGHT ANKLE - COMPLETE 3+ VIEW   COMPARISON:  Right tibia and fibula radiographs, 03/15/2021.   FINDINGS: No fracture  or bone lesion.   Ankle joint normally spaced and aligned. Small marginal spurs are noted from the anterior posterior tissue the margins of the distal tibia. No other degenerative/arthropathic changes.   Small dorsal and moderate plantar calcaneal spurs.   Mild diffuse subcutaneous soft tissue edema.   IMPRESSION: 1. No fracture or acute finding. 2. Minor tibia talar joint degenerative changes. 3. Calcaneal spurs.     Electronically  Signed   By: Amie Portland M.D.   On: 11/01/2022 13:04    PATIENT SURVEYS:  FOTO 39(63 predicted)  12/25/22 61%  MUSCLE LENGTH: NT  POSTURE: No Significant postural limitations  PALPATION: TTP achilles insertion  LOWER EXTREMITY ROM:  A/PROM Right eval Left eval  Hip flexion    Hip extension    Hip abduction    Hip adduction    Hip internal rotation    Hip external rotation    Knee flexion    Knee extension    Ankle dorsiflexion 15/18d 18d  Ankle plantarflexion [facility] WFL  Ankle inversion [facility] WFL  Ankle eversion WFL WFL   (Blank rows = not tested)  LOWER EXTREMITY MMT:  MMT Right eval Left eval  Hip flexion    Hip extension    Hip abduction    Hip adduction    Hip internal rotation    Hip external rotation    Knee flexion    Knee extension    Ankle dorsiflexion 4 5  Ankle plantarflexion 4- 5  Ankle inversion 4 5  Ankle eversion 4 5   (Blank rows = not tested)  LOWER EXTREMITY SPECIAL TESTS:  Ankle special tests: Windlass test negative  FUNCTIONAL TESTS:  5 times sit to stand: 18s arms crossed  GAIT: Distance walked: 46ft x2 Assistive device utilized: None Level of assistance: Complete Independence Comments: decreased R push off   TODAY'S TREATMENT:      [facility] Adult PT Treatment:                                                DATE: 12/25/22 Therapeutic Exercise: Nustep L5 8 min Slant board gastroc/soleus stretch 30s x2 ea. Runner step 6 in 5 # KB 10/10 Step up 4 in concentric L, eccentric R 15x STS no UE, on Airex x5 4" step heel raise 1s/5s ratio 15x B emphasis on R eccentric phase   Modalities: Ionotophoresis 4mg /ml Dexamethasone R achilles 8 min while monitoring patient symptoms/response  [facility] Adult PT Treatment:                                                DATE: 12/18/22 Therapeutic Exercise: Nustep level 4 x 5 mins Slant board gastroc/soleus stretch x1' Runner step 6 in 2x12 BIL Step up 6 in concentric L, eccentric R 15x2 STS no UE,  on Airex x5 STS with Rt foot back to encourage weight shift onto RLE x10 4" step heel raise 1s/5s ratio 2x10 Ankle AROM circles, AP, ABCs Neuromuscular Re-ed: Tandem walking at counter x4 laps fwd/bwd Tandem stance on Airex x 30" BIL FT stance on Airex with head turns x30" FT stance on Airex with reaching x30"   [facility] Adult PT Treatment:  DATE: 12/13/22 Therapeutic Exercise: Nustep level 4 x 8 mins PF eccentric in long sit BlaTB 5s eccentric contraction Slant board gastroc/soleus stretch 30s x2 ea. Runner step 6 in 2x15 Stp up 4 in concentric L, eccentric R 15x  Modalities: Ionotophoresis 4mg /ml Dexamethasone R achilles 8 min while monitoring patient symptoms/response  [facility] Adult PT Treatment:                                                DATE: 12/11/22 Therapeutic Exercise: Nustep level 3 x 8 mins PF stretch with towel 30s x2 Slant board gastroc/soleus stretch 30s x2 ea. Runner step 6 in 2x10 Concentric/Eccentric heel raises 2x10 1/5s ratio 4 in RLE WS onto 8 in step 15x for stretch and eccentric work    PATIENT EDUCATION:  Education details: Discussed eval findings, rehab rationale and POC and patient is in agreement  Person educated: Patient Education method: Explanation Education comprehension: verbalized understanding and needs further education  HOME EXERCISE PROGRAM: Access Code: [email or web address] URL: [email or web address] Date: 11/27/2022 Prepared by: Gustavus Bryant  Exercises - Long Sitting Plantar Fascia Stretch with Towel  - 2 x daily - 5 x weekly - 1 sets - 3 reps - 30s hold - Standing Eccentric Heel Raise  - 2 x daily - 5 x weekly - 1 sets - 10 reps  ASSESSMENT:  CLINICAL IMPRESSION: Symptoms lessening over time.  Discomfort less evident in AM upon getting out of bed.  Continued eccentric tasks and ionto as symptoms are receding.  Foto score improved   EVAL-Patient is a 52 y.o. female who was seen  today for physical therapy evaluation and treatment for chronic R achilles tendinosis.  Patient presents with good AROM through R ankle, PF strength limited by pain, TTP at R achilles insertion.  STS time increased due to symptoms.  Windlass test negative.   Patient demonstrating chronic R achilles tendinopathy.  She will consider a walking boot and reach out to MD.  Heel lift issued as well as HEP for stretch and eccentric training.  Rehab potential is good to guarded based on chronicity.  OBJECTIVE IMPAIRMENTS: Abnormal gait, decreased knowledge of condition, decreased mobility, difficulty walking, decreased strength, obesity, and pain.   ACTIVITY LIMITATIONS: sitting, standing, and prolonged positioning  PERSONAL FACTORS: Age, Fitness, and Time since onset of injury/illness/exacerbation are also affecting patient's functional outcome.   REHAB POTENTIAL: Good  CLINICAL DECISION MAKING: Stable/uncomplicated  EVALUATION COMPLEXITY: Low   GOALS: Goals reviewed with patient? No  SHORT TERM GOALS: Target date: 12/18/2022   Patient to demonstrate independence in HEP  Baseline: [id number] Goal status: Met  2.  Patient to obtain CAM boot Baseline: TBD; 12/25/22 Ordered by MD Goal status: Met   LONG TERM GOALS: Target date: 01/08/2023    Decrease pain to 4/10 Baseline: 10/10; 12/13/22 5-6/10; 12/25/22 2-3/10 Goal status: Ongoing  2.  Increase FOTO score to 63 Baseline: 39; 12/25/22 61 Goal status: Progressing  3.  Increase R PF strength to 4/5 Baseline: 4-/5 Goal status: INITIAL  4.  Decrease 5x STS time to 15s arms crossed Baseline: 17s arms crossed Goal status: INITIAL   PLAN:  PT FREQUENCY: 1-2x/week  PT DURATION: 6 weeks  PLANNED INTERVENTIONS: Therapeutic exercises, Therapeutic activity, Neuromuscular re-education, Balance training, Gait training, Patient/Family education, Self Care, Joint mobilization, Stair training, DME instructions, Dry Needling, Electrical stimulation,  Cryotherapy, Moist heat,  Vasopneumatic device, Ultrasound, Ionotophoresis 4mg /ml Dexamethasone, Manual therapy, and Re-evaluation  PLAN FOR NEXT SESSION: HEP review and update, manual techniques as appropriate, aerobic tasks, ROM and flexibility activities, strengthening and PREs, TPDN, gait and balance training as needed, consider IASTM     Hildred Laser, PT 12/25/2022, 8:32 AM

## 2022-12-25 ENCOUNTER — Ambulatory Visit: Payer: BC Managed Care – PPO

## 2022-12-25 DIAGNOSIS — G8929 Other chronic pain: Secondary | ICD-10-CM

## 2022-12-25 DIAGNOSIS — M6281 Muscle weakness (generalized): Secondary | ICD-10-CM | POA: Diagnosis not present

## 2022-12-25 DIAGNOSIS — M7661 Achilles tendinitis, right leg: Secondary | ICD-10-CM

## 2022-12-27 ENCOUNTER — Ambulatory Visit: Payer: BC Managed Care – PPO

## 2022-12-27 DIAGNOSIS — M6281 Muscle weakness (generalized): Secondary | ICD-10-CM

## 2022-12-27 DIAGNOSIS — M7661 Achilles tendinitis, right leg: Secondary | ICD-10-CM

## 2022-12-27 DIAGNOSIS — M79671 Pain in right foot: Secondary | ICD-10-CM

## 2022-12-27 NOTE — Therapy (Signed)
OUTPATIENT PHYSICAL THERAPY TREATMENT NOTE   Patient Name: Jodi Saunders MRN: [id number] DOB:04-29-1971, 52 y.o., female Today's Date: 12/27/2022  END OF SESSION:  PT End of Session - 12/27/22 1128     Visit Number 8    Number of Visits 12    Date for PT Re-Evaluation 01/22/23    Authorization Type BCBS    PT Start Time 1128    PT Stop Time 1208    PT Time Calculation (min) 40 min    Activity Tolerance Patient tolerated treatment well;Patient limited by pain    Behavior During Therapy [facility] for tasks assessed/performed             Past Medical History:  Diagnosis Date   Abnormal uterine bleeding 04/21/2013   Allergy    Anemia    Aortic atherosclerosis (HCC) 05/2021   Chest pain    Hypertension    Multiple allergies    Past Surgical History:  Procedure Laterality Date   ABDOMINAL HYSTERECTOMY     APPENDECTOMY     BREAST BIOPSY Left 08/2016   CHOLECYSTECTOMY  2011   CYSTOSCOPY Bilateral 05/13/2019   Procedure: CYSTOSCOPY;  Surgeon: Willodean Rosenthal, MD;  Location: [facility];  Service: Gynecology;  Laterality: Bilateral;   HYSTEROSCOPY WITH NOVASURE  06/03/2012   Procedure: HYSTEROSCOPY WITH NOVASURE;  Surgeon: Catalina Antigua, MD;  Location: [facility];  Service: Gynecology;  Laterality: N/A;   LAPAROSCOPIC APPENDECTOMY N/A 12/14/2015   Procedure: APPENDECTOMY LAPAROSCOPIC CONVERTED TO OPEN;  Surgeon: Chevis Pretty III, MD;  Location: [facility];  Service: General;  Laterality: N/A;   MULTIPLE TOOTH EXTRACTIONS     ORIF RADIUS & ULNA FRACTURES  2002   ROBOTIC ASSISTED TOTAL HYSTERECTOMY Bilateral 05/13/2019   Procedure: XI ROBOTIC ASSISTED TOTAL HYSTERECTOMY WITH SALPINGECTOMY, LYSIS OF ADHESION;  Surgeon: Willodean Rosenthal, MD;  Location: [facility];  Service: Gynecology;  Laterality: Bilateral;   TONSILLECTOMY  2008   VAGINAL DELIVERY  1992   WISDOM TOOTH EXTRACTION     Patient Active Problem List   Diagnosis Date  Noted   Hypertension 09/04/2022   Aortic atherosclerosis (HCC) 05/20/2021   Hyperplastic polyp of ascending colon 12/02/2018   Hyperlipidemia 03/19/2018   Congenital pes planus 11/19/2007   Severe obesity (HCC) 08/16/2006   Anemia 08/16/2006    PCP: Westley Chandler, MD  REFERRING PROVIDER: Westley Chandler, MD  REFERRING DIAG: [phone] (ICD-10-CM) - Right foot pain  THERAPY DIAG:  Muscle weakness (generalized)  Heel pain, chronic, right  Achilles tendinitis, right leg  Rationale for Evaluation and Treatment: Rehabilitation  ONSET DATE: 6-7 mos ago  SUBJECTIVE:   SUBJECTIVE STATEMENT: She states that her foot/ankle feels more "uncomfortable" than actually in pain. 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    PERTINENT HISTORY:  Ankle Pain Likely achilles tendinopathy Also considered OA No edema or calf symptoms suggestive of DVT X-ray, PT for eccentric training PAIN:  Are you having pain? Yes: NPRS scale: 1/10 Pain location: R ankle Pain description: ache Aggravating factors: prolonged positioning Relieving factors: undetermined  PRECAUTIONS: None  WEIGHT BEARING RESTRICTIONS: No  FALLS:  Has patient fallen in last 6 months? No   OCCUPATION: HR  PLOF: Independent  PATIENT GOALS: to reduce and manage my ankle pain  NEXT MD VISIT: PRN  OBJECTIVE:   DIAGNOSTIC FINDINGS:  EXAM: RIGHT ANKLE - COMPLETE 3+ VIEW   COMPARISON:  Right tibia and fibula radiographs, 03/15/2021.  FINDINGS: No fracture or bone lesion.   Ankle joint normally spaced and aligned. Small marginal spurs are noted from the anterior posterior tissue the margins of the distal tibia. No other degenerative/arthropathic changes.   Small dorsal and moderate plantar calcaneal spurs.   Mild diffuse subcutaneous soft tissue edema.   IMPRESSION: 1. No fracture or acute finding. 2. Minor tibia talar joint degenerative changes. 3. Calcaneal  spurs.     Electronically Signed   By: Amie Portland M.D.   On: 11/01/2022 13:04    PATIENT SURVEYS:  FOTO 39(63 predicted)  12/25/22 61%  MUSCLE LENGTH: NT  POSTURE: No Significant postural limitations  PALPATION: TTP achilles insertion  LOWER EXTREMITY ROM:  A/PROM Right eval Left eval  Hip flexion    Hip extension    Hip abduction    Hip adduction    Hip internal rotation    Hip external rotation    Knee flexion    Knee extension    Ankle dorsiflexion 15/18d 18d  Ankle plantarflexion [facility] WFL  Ankle inversion [facility] WFL  Ankle eversion WFL WFL   (Blank rows = not tested)  LOWER EXTREMITY MMT:  MMT Right eval Left eval  Hip flexion    Hip extension    Hip abduction    Hip adduction    Hip internal rotation    Hip external rotation    Knee flexion    Knee extension    Ankle dorsiflexion 4 5  Ankle plantarflexion 4- 5  Ankle inversion 4 5  Ankle eversion 4 5   (Blank rows = not tested)  LOWER EXTREMITY SPECIAL TESTS:  Ankle special tests: Windlass test negative  FUNCTIONAL TESTS:  5 times sit to stand: 18s arms crossed  GAIT: Distance walked: 39ft x2 Assistive device utilized: None Level of assistance: Complete Independence Comments: decreased R push off   TODAY'S TREATMENT:     [facility] Adult PT Treatment:                                                DATE: 12/27/22 Therapeutic Exercise: Nustep L5 5 min Slant board gastroc stretch 2x1' Runner step 6 in 10# KB 10/10 4" step heel raise 1s/5s ratio 15x B emphasis on R eccentric phase Neuromuscular Re-ed: Tandem stance on Airex x 30" BIL FT stance on Airex with head turns x30" Modalities: Ionotophoresis 4mg /ml Dexamethasone R achilles with rock tape to secure    [facility] Adult PT Treatment:                                                DATE: 12/25/22 Therapeutic Exercise: Nustep L5 8 min Slant board gastroc/soleus stretch 30s x2 ea. Runner step 6 in 5 # KB 10/10 Step up 4 in concentric L,  eccentric R 15x STS no UE, on Airex x5 4" step heel raise 1s/5s ratio 15x B emphasis on R eccentric phase   Modalities: Ionotophoresis 4mg /ml Dexamethasone R achilles 8 min while monitoring patient symptoms/response  [facility] Adult PT Treatment:  DATE: 12/18/22 Therapeutic Exercise: Nustep level 4 x 5 mins Slant board gastroc/soleus stretch x1' Runner step 6 in 2x12 BIL Step up 6 in concentric L, eccentric R 15x2 STS no UE, on Airex x5 STS with Rt foot back to encourage weight shift onto RLE x10 4" step heel raise 1s/5s ratio 2x10 Ankle AROM circles, AP, ABCs Neuromuscular Re-ed: Tandem walking at counter x4 laps fwd/bwd Tandem stance on Airex x 30" BIL FT stance on Airex with head turns x30" FT stance on Airex with reaching x30"    PATIENT EDUCATION:  Education details: Discussed eval findings, rehab rationale and POC and patient is in agreement  Person educated: Patient Education method: Explanation Education comprehension: verbalized understanding and needs further education  HOME EXERCISE PROGRAM: Access Code: [email or web address] URL: [email or web address] Date: 11/27/2022 Prepared by: Gustavus Bryant  Exercises - Long Sitting Plantar Fascia Stretch with Towel  - 2 x daily - 5 x weekly - 1 sets - 3 reps - 30s hold - Standing Eccentric Heel Raise  - 2 x daily - 5 x weekly - 1 sets - 10 reps  ASSESSMENT:  CLINICAL IMPRESSION:  Patient presents to PT reporting mild pain in her Rt ankle and describes more "discomfort" upon waking and getting out of bed. Session today continued to focus on LE strengthening and stretching and use of ionto. Patient was able to tolerate all prescribed exercises with no adverse effects. Patient continues to benefit from skilled PT services and should be progressed as able to improve functional independence.    EVAL-Patient is a 52 y.o. female who was seen today for physical therapy evaluation and  treatment for chronic R achilles tendinosis.  Patient presents with good AROM through R ankle, PF strength limited by pain, TTP at R achilles insertion.  STS time increased due to symptoms.  Windlass test negative.   Patient demonstrating chronic R achilles tendinopathy.  She will consider a walking boot and reach out to MD.  Heel lift issued as well as HEP for stretch and eccentric training.  Rehab potential is good to guarded based on chronicity.  OBJECTIVE IMPAIRMENTS: Abnormal gait, decreased knowledge of condition, decreased mobility, difficulty walking, decreased strength, obesity, and pain.   ACTIVITY LIMITATIONS: sitting, standing, and prolonged positioning  PERSONAL FACTORS: Age, Fitness, and Time since onset of injury/illness/exacerbation are also affecting patient's functional outcome.   REHAB POTENTIAL: Good  CLINICAL DECISION MAKING: Stable/uncomplicated  EVALUATION COMPLEXITY: Low   GOALS: Goals reviewed with patient? No  SHORT TERM GOALS: Target date: 12/18/2022   Patient to demonstrate independence in HEP  Baseline: [id number] Goal status: Met  2.  Patient to obtain CAM boot Baseline: TBD; 12/25/22 Ordered by MD Goal status: Met   LONG TERM GOALS: Target date: 01/08/2023    Decrease pain to 4/10 Baseline: 10/10; 12/13/22 5-6/10; 12/25/22 2-3/10 Goal status: Ongoing  2.  Increase FOTO score to 63 Baseline: 39; 12/25/22 61 Goal status: Progressing  3.  Increase R PF strength to 4/5 Baseline: 4-/5 Goal status: INITIAL  4.  Decrease 5x STS time to 15s arms crossed Baseline: 17s arms crossed Goal status: INITIAL   PLAN:  PT FREQUENCY: 1-2x/week  PT DURATION: 6 weeks  PLANNED INTERVENTIONS: Therapeutic exercises, Therapeutic activity, Neuromuscular re-education, Balance training, Gait training, Patient/Family education, Self Care, Joint mobilization, Stair training, DME instructions, Dry Needling, Electrical stimulation, Cryotherapy, Moist heat, Vasopneumatic  device, Ultrasound, Ionotophoresis 4mg /ml Dexamethasone, Manual therapy, and Re-evaluation  PLAN FOR NEXT SESSION: HEP review and update, manual techniques as appropriate,  aerobic tasks, ROM and flexibility activities, strengthening and PREs, TPDN, gait and balance training as needed, consider IASTM     Berta Minor, PTA 12/27/2022, 12:11 PM

## 2023-01-10 ENCOUNTER — Ambulatory Visit: Payer: BC Managed Care – PPO

## 2023-01-10 ENCOUNTER — Telehealth: Payer: Self-pay

## 2023-01-10 DIAGNOSIS — U071 COVID-19: Secondary | ICD-10-CM

## 2023-01-10 MED ORDER — PAXLOVID (300/100) 20 X 150 MG & 10 X 100MG PO TBPK
3.0000 | ORAL_TABLET | Freq: Two times a day (BID) | ORAL | 0 refills | Status: DC
Start: 2023-01-10 — End: 2023-03-30

## 2023-01-10 NOTE — Telephone Encounter (Signed)
Patient calls nurse line reporting a positive covid test this morning.   She reports symptoms started this morning with a temperature of 100.4 and a headache. She also reports cough and congestion. She reports yellowish phlegm.  She denies any SOB, chills, body aches or GI upset.   Patient advised to try warm fluids with honey and OTC decongestants along with tylenol/ibuprofen for fevers/headaches. Stay well hydrated.  Patient is interested in anti viral therapy if PCP thinks she would benefit.   Will forward to PCP.

## 2023-01-10 NOTE — Telephone Encounter (Signed)
Please schedule for virtual visit with open ATC slot to discuss.   Thank you Terisa Starr, MD  [facility] Medicine Teaching Service

## 2023-01-10 NOTE — Telephone Encounter (Signed)
Called patient.  Reports mild headache, fevers, cough, congestion. No CP or DOE. Eating and drinking well, pushing fluids Discussed side effects of Paxlovid No medication interactions that require adjustment--she will stop BP medications while unwell Rx Paxlovid  Terisa Starr, MD  Family Medicine Teaching Service

## 2023-01-12 ENCOUNTER — Ambulatory Visit: Payer: BC Managed Care – PPO

## 2023-01-15 ENCOUNTER — Ambulatory Visit: Payer: BC Managed Care – PPO

## 2023-01-17 ENCOUNTER — Ambulatory Visit: Payer: BC Managed Care – PPO

## 2023-01-26 NOTE — Therapy (Unsigned)
OUTPATIENT PHYSICAL THERAPY TREATMENT NOTE/PROGRESS REPORT   Patient Name: Jodi Saunders MRN: [id number] DOB:20-Jan-1971, 52 y.o., female Today's Date: 01/29/2023  END OF SESSION:  PT End of Session - 01/29/23 0831     Visit Number 9    Number of Visits 12    Date for PT Re-Evaluation 01/22/23    Authorization Type [employer]    PT Start Time 0830    PT Stop Time 0910    PT Time Calculation (min) 40 min    Activity Tolerance Patient tolerated treatment well;Patient limited by pain    Behavior During Therapy [facility] for tasks assessed/performed              Past Medical History:  Diagnosis Date   Abnormal uterine bleeding 04/21/2013   Allergy    Anemia    Aortic atherosclerosis (HCC) 05/2021   Chest pain    Hypertension    Multiple allergies    Past Surgical History:  Procedure Laterality Date   ABDOMINAL HYSTERECTOMY     APPENDECTOMY     BREAST BIOPSY Left 08/2016   CHOLECYSTECTOMY  2011   CYSTOSCOPY Bilateral 05/13/2019   Procedure: CYSTOSCOPY;  Surgeon: Willodean Rosenthal, MD;  Location: [facility];  Service: Gynecology;  Laterality: Bilateral;   HYSTEROSCOPY WITH NOVASURE  06/03/2012   Procedure: HYSTEROSCOPY WITH NOVASURE;  Surgeon: Catalina Antigua, MD;  Location: [facility];  Service: Gynecology;  Laterality: N/A;   LAPAROSCOPIC APPENDECTOMY N/A 12/14/2015   Procedure: APPENDECTOMY LAPAROSCOPIC CONVERTED TO OPEN;  Surgeon: Chevis Pretty III, MD;  Location: [facility];  Service: General;  Laterality: N/A;   MULTIPLE TOOTH EXTRACTIONS     ORIF RADIUS & ULNA FRACTURES  2002   ROBOTIC ASSISTED TOTAL HYSTERECTOMY Bilateral 05/13/2019   Procedure: XI ROBOTIC ASSISTED TOTAL HYSTERECTOMY WITH SALPINGECTOMY, LYSIS OF ADHESION;  Surgeon: Willodean Rosenthal, MD;  Location: [facility];  Service: Gynecology;  Laterality: Bilateral;   TONSILLECTOMY  2008   VAGINAL DELIVERY  1992   WISDOM TOOTH EXTRACTION     Patient Active Problem List    Diagnosis Date Noted   Hypertension 09/04/2022   Aortic atherosclerosis (HCC) 05/20/2021   Hyperplastic polyp of ascending colon 12/02/2018   Hyperlipidemia 03/19/2018   Congenital pes planus 11/19/2007   Severe obesity (HCC) 08/16/2006   Anemia 08/16/2006    PCP: Westley Chandler, MD  REFERRING PROVIDER: Westley Chandler, MD  REFERRING DIAG: [phone] (ICD-10-CM) - Right foot pain  THERAPY DIAG:  Muscle weakness (generalized)  Heel pain, chronic, right  Achilles tendinitis, right leg  Rationale for Evaluation and Treatment: Rehabilitation  ONSET DATE: 6-7 mos ago  SUBJECTIVE:   SUBJECTIVE STATEMENT: Returns to OPPT following a 30 day absence with no overall progress to report.  She has obtained a CAM boot which offers relief but symptoms return when out of boot.   PERTINENT HISTORY:  Ankle Pain Likely achilles tendinopathy Also considered OA No edema or calf symptoms suggestive of DVT X-ray, PT for eccentric training PAIN:  Are you having pain? Yes: NPRS scale: 10/10 Pain location: R ankle Pain description: ache Aggravating factors: prolonged positioning Relieving factors: undetermined  PRECAUTIONS: None  WEIGHT BEARING RESTRICTIONS: No  FALLS:  Has patient fallen in last 6 months? No   OCCUPATION: [employer]  PLOF: Independent  PATIENT GOALS: to reduce and manage my ankle pain  NEXT MD VISIT: PRN  OBJECTIVE:   DIAGNOSTIC FINDINGS:  EXAM: RIGHT ANKLE - COMPLETE 3+ VIEW   COMPARISON:  Right tibia  and fibula radiographs, 03/15/2021.   FINDINGS: No fracture or bone lesion.   Ankle joint normally spaced and aligned. Small marginal spurs are noted from the anterior posterior tissue the margins of the distal tibia. No other degenerative/arthropathic changes.   Small dorsal and moderate plantar calcaneal spurs.   Mild diffuse subcutaneous soft tissue edema.   IMPRESSION: 1. No fracture or acute finding. 2. Minor tibia talar joint degenerative  changes. 3. Calcaneal spurs.     Electronically Signed   By: Amie Portland M.D.   On: 11/01/2022 13:04    PATIENT SURVEYS:  FOTO 39(63 predicted)  12/25/22 61%; 01/29/23 36%  MUSCLE LENGTH: NT  POSTURE: No Significant postural limitations  PALPATION: TTP achilles insertion  LOWER EXTREMITY ROM:  A/PROM Right eval Left eval R 01/29/23  Hip flexion     Hip extension     Hip abduction     Hip adduction     Hip internal rotation     Hip external rotation     Knee flexion     Knee extension     Ankle dorsiflexion 15/18d 18d 10/18  Ankle plantarflexion [facility] WFL 35d  Ankle inversion [facility] WFL 40d  Ankle eversion WFL WFL 22d   (Blank rows = not tested)  LOWER EXTREMITY MMT:  MMT Right eval Left eval R 01/29/23  Hip flexion     Hip extension     Hip abduction     Hip adduction     Hip internal rotation     Hip external rotation     Knee flexion     Knee extension     Ankle dorsiflexion 4 5 4+  Ankle plantarflexion 4- 5 3+  Ankle inversion 4 5 5   Ankle eversion 4 5 5    (Blank rows = not tested)  LOWER EXTREMITY SPECIAL TESTS:  Ankle special tests: Windlass test negative 01/29/23 SLS 10s B  FUNCTIONAL TESTS:  5 times sit to stand: 18s arms crossed ; 01/29/23 14s arms crossed  GAIT: Distance walked: 61ft x2 Assistive device utilized: None Level of assistance: Complete Independence Comments: decreased R push off   TODAY'S TREATMENT:     [facility] Adult PT Treatment:                                                DATE: 01/29/23 Re-assessment  [facility] Adult PT Treatment:                                                DATE: 12/27/22 Therapeutic Exercise: Nustep L5 5 min Slant board gastroc stretch 2x1' Runner step 6 in 10# KB 10/10 4" step heel raise 1s/5s ratio 15x B emphasis on R eccentric phase Neuromuscular Re-ed: Tandem stance on Airex x 30" BIL FT stance on Airex with head turns x30" Modalities: Ionotophoresis 4mg /ml Dexamethasone R achilles with rock tape to  secure    [facility] Adult PT Treatment:                                                DATE: 12/25/22 Therapeutic Exercise: Nustep L5 8  min Slant board gastroc/soleus stretch 30s x2 ea. Runner step 6 in 5 # KB 10/10 Step up 4 in concentric L, eccentric R 15x STS no UE, on Airex x5 4" step heel raise 1s/5s ratio 15x B emphasis on R eccentric phase   Modalities: Ionotophoresis 4mg /ml Dexamethasone R achilles 8 min while monitoring patient symptoms/response  [facility] Adult PT Treatment:                                                DATE: 12/18/22 Therapeutic Exercise: Nustep level 4 x 5 mins Slant board gastroc/soleus stretch x1' Runner step 6 in 2x12 BIL Step up 6 in concentric L, eccentric R 15x2 STS no UE, on Airex x5 STS with Rt foot back to encourage weight shift onto RLE x10 4" step heel raise 1s/5s ratio 2x10 Ankle AROM circles, AP, ABCs Neuromuscular Re-ed: Tandem walking at counter x4 laps fwd/bwd Tandem stance on Airex x 30" BIL FT stance on Airex with head turns x30" FT stance on Airex with reaching x30"    PATIENT EDUCATION:  Education details: Discussed eval findings, rehab rationale and POC and patient is in agreement  Person educated: Patient Education method: Explanation Education comprehension: verbalized understanding and needs further education  HOME EXERCISE PROGRAM: Access Code: [email or web address] URL: [email or web address] Date: 11/27/2022 Prepared by: Gustavus Bryant  Exercises - Long Sitting Plantar Fascia Stretch with Towel  - 2 x daily - 5 x weekly - 1 sets - 3 reps - 30s hold - Standing Eccentric Heel Raise  - 2 x daily - 5 x weekly - 1 sets - 10 reps  ASSESSMENT:  CLINICAL IMPRESSION: Patient returns to OPPT following an absence due to out of town trip and [place].  Overall pain levels remain high and FOTO scores have regressed.  Patient shows functional ROM in R ankle but demos strength deficits in PF.  SLS times equal B.  Palpation finds point  tenderness to distal achilles tendon but no point tenderness to gastroc/soleus group detected.  She has been compliant with HEP including stretching and eccentric strengthening tasks.  At this time recommend return to MD for f/u to determine ongoing cause of symptoms.   EVAL-Patient is a 52 y.o. female who was seen today for physical therapy evaluation and treatment for chronic R achilles tendinosis.  Patient presents with good AROM through R ankle, PF strength limited by pain, TTP at R achilles insertion.  STS time increased due to symptoms.  Windlass test negative.   Patient demonstrating chronic R achilles tendinopathy.  She will consider a walking boot and reach out to MD.  Heel lift issued as well as HEP for stretch and eccentric training.  Rehab potential is good to guarded based on chronicity.  OBJECTIVE IMPAIRMENTS: Abnormal gait, decreased knowledge of condition, decreased mobility, difficulty walking, decreased strength, obesity, and pain.   ACTIVITY LIMITATIONS: sitting, standing, and prolonged positioning  PERSONAL FACTORS: Age, Fitness, and Time since onset of injury/illness/exacerbation are also affecting patient's functional outcome.   REHAB POTENTIAL: Good  CLINICAL DECISION MAKING: Stable/uncomplicated  EVALUATION COMPLEXITY: Low   GOALS: Goals reviewed with patient? No  SHORT TERM GOALS: Target date: 12/18/2022   Patient to demonstrate independence in HEP  Baseline: [id number] Goal status: Met  2.  Patient to obtain CAM boot Baseline: TBD; 12/25/22 Ordered by MD Goal status: Met   LONG TERM  GOALS: Target date: 01/08/2023    Decrease pain to 4/10 Baseline: 10/10; 12/13/22 5-6/10; 12/25/22 2-3/10; 01/29/23 10/10 Goal status: Ongoing  2.  Increase FOTO score to 63 Baseline: 39; 12/25/22 61; 01/29/23 36  Goal status: Regressing  3.  Increase R PF strength to 4/5 Baseline: 4-/5 Goal status: INITIAL  4.  Decrease 5x STS time to 15s arms crossed Baseline: 17s arms  crossed; 14s arms crossed Goal status: Met   PLAN:  PT FREQUENCY: 1-2x/week  PT DURATION: 6 weeks  PLANNED INTERVENTIONS: Therapeutic exercises, Therapeutic activity, Neuromuscular re-education, Balance training, Gait training, Patient/Family education, Self Care, Joint mobilization, Stair training, DME instructions, Dry Needling, Electrical stimulation, Cryotherapy, Moist heat, Vasopneumatic device, Ultrasound, Ionotophoresis 4mg /ml Dexamethasone, Manual therapy, and Re-evaluation  PLAN FOR NEXT SESSION: HEP review and update, manual techniques as appropriate, aerobic tasks, ROM and flexibility activities, strengthening and PREs, TPDN, gait and balance training as needed, consider IASTM     Hildred Laser, PT 01/29/2023, 9:22 AM

## 2023-01-29 ENCOUNTER — Ambulatory Visit: Payer: BC Managed Care – PPO | Attending: Family Medicine

## 2023-01-29 DIAGNOSIS — M7661 Achilles tendinitis, right leg: Secondary | ICD-10-CM

## 2023-01-29 DIAGNOSIS — G8929 Other chronic pain: Secondary | ICD-10-CM

## 2023-01-29 DIAGNOSIS — M79671 Pain in right foot: Secondary | ICD-10-CM | POA: Diagnosis present

## 2023-01-29 DIAGNOSIS — M6281 Muscle weakness (generalized): Secondary | ICD-10-CM

## 2023-01-31 ENCOUNTER — Ambulatory Visit: Payer: BC Managed Care – PPO

## 2023-02-01 ENCOUNTER — Ambulatory Visit: Payer: BC Managed Care – PPO | Admitting: Student

## 2023-02-01 VITALS — BP 148/92 | HR 88 | Ht 64.0 in | Wt 279.1 lb

## 2023-02-01 DIAGNOSIS — R051 Acute cough: Secondary | ICD-10-CM

## 2023-02-01 DIAGNOSIS — Z90710 Acquired absence of both cervix and uterus: Secondary | ICD-10-CM | POA: Insufficient documentation

## 2023-02-01 NOTE — Progress Notes (Signed)
    SUBJECTIVE:   CHIEF COMPLAINT / HPI:   Jodi Saunders is a 52 y.o. female  presenting for 3 days of cough, sore throat, runny nose, and ear pressure.   She was at a friends house on Sunday where she took several bites of a pretzel containing almonds.  She reports having an allergy to almonds.  She immediately took a Zyrtec and then went home and took Benadryl.  She reports the coughing and sore throat started the next day on Monday.  She denies her airway closing up after the event, hives, new onset rash.  She is very distressed because she has heard herself wheezing and she is coughing so much that now her ribs are starting to hurt.    PERTINENT  PMH / PSH: Reviewed and updated   OBJECTIVE:   BP (!) 148/92   Pulse 88   Ht 5\' 4"  (1.626 m)   Wt 279 lb 2 oz (126.6 kg)   LMP 04/25/2019   SpO2 99%   BMI 47.91 kg/m   Well-appearing, no acute distress HEENT: ear canals clear bilaterally, TMs with mild serus fluid bilaterally.  Oropharynx with erythema consistent with postnasal drip, red and inflamed nasal turbinates with clear discharge.  Cardio: Regular rate, regular rhythm, no murmurs on exam. Pulm: Clear, no wheezing, no crackles. No increased work of breathing     10/27/2022    9:26 AM 10/10/2022    9:57 AM 09/05/2022    9:08 AM  PHQ9 SCORE ONLY  PHQ-9 Total Score 0 0 1      ASSESSMENT/PLAN:   No problem-specific Assessment & Plan notes found for this encounter.   Acute Cough:  Most likely 2/2 to viral infection rather than acute allergic reaction due to symptom timeline. No wheezing or focal consolidations on exam, patient is afebrile - low suspicion for pneumonia. Will test for COVID and Flu. Instructed the patient to start using Flonase nasal spray for sinus congestion and honey to help with her persistent cough. Will send a MyChart message with the results of viral testing.   Hypertension:  BP elevated on exam today most likely in the setting of acute  illness. Recheck at next visit.   Glendale Chard, DO [facility]

## 2023-02-01 NOTE — Patient Instructions (Signed)
I am prescribing Flonase nasal spray for congestion:   Adults:  - 2 sprays in each nostril once daily, scheduled or as needed - if symptoms are controlled after 1 week, consider reducing to 1 spray in each nostril once daily, scheduled or as needed  - maximum dose: 2 sprays per nostril per day      Try honey to help with your cough. Cough medicine over the counter is not very effective. Continue to stay hydrated.   I will send you a MyChart message with your Flu results.

## 2023-02-03 LAB — COVID-19, FLU A+B NAA
Influenza A, NAA: NOT DETECTED
Influenza B, NAA: NOT DETECTED
SARS-CoV-2, NAA: NOT DETECTED

## 2023-02-06 ENCOUNTER — Ambulatory Visit: Payer: BC Managed Care – PPO

## 2023-02-08 ENCOUNTER — Ambulatory Visit: Payer: BC Managed Care – PPO

## 2023-03-29 NOTE — Progress Notes (Signed)
    SUBJECTIVE:   CHIEF COMPLAINT: BP check HPI:   Jodi Saunders is a 52 y.o.  with history notable for hypertension prediabetes presenting for follow-up.   She reports she would like to discuss her weight today.  This is slowly been increasing.  She has tried intermittent fasting with some success in the past.  This is not working as well currently.  She has had a change in her life and recently got married and now intermittent fasting is more difficult.  She remains very active.  She has tried multiple dietary changes without great success.  The patient reports she recently traveled and lost some of her blood pressure pills.  She is taking her combination medication 2-3 times last week.  She denies headaches, chest pain, difficulty breathing.  The patient reports her xerosis on her lower extremities slowly improving.  PERTINENT  PMH / PSH/Family/Social History :reviewed   OBJECTIVE:   BP (!) 146/89   Pulse 92   Ht 5\' 4"  (1.626 m)   Wt 283 lb 6.4 oz (128.5 kg)   LMP 04/25/2019   SpO2 100%   BMI 48.65 kg/m   Today's weight:  Last Weight  Most recent update: 03/30/2023  8:52 AM    Weight  128.5 kg (283 lb 6.4 oz)            Review of prior weights: [employer]   03/30/23 0852  Weight: 283 lb 6.4 oz (128.5 kg)   RRR Lungs clear No edema  Xerosis without hyperpigmentation with positive varicose veins   ASSESSMENT/PLAN:   Assessment & Plan Peanut allergy EpiPen refilled Need for shingles vaccine Prescribed and sent to pharmacy Prediabetes A1c today Primary hypertension Not at goal as currently not taking medications regularly.  Refilled medicines.  Repeat BMP at follow-up.  Checked her cuff against our in office cuff her cuff does read a few systolic points higher than ours.  Follow-up 2 to 3 weeks for this. Hypertension, unspecified type Not at goal as currently not taking medications regularly.  Refilled medicines.  Repeat BMP at follow-up.   Checked her cuff against our in office cuff her cuff does read a few systolic points higher than ours.  Follow-up 2 to 3 weeks for this. Severe obesity (HCC) The patient has tried and failed multiple dietary changes, increasing activity.  A1c today.  Discussed options including nutrition medication surgery.  She is interested in nutrition.  Information given for Dr. Gerilyn Pilgrim.  Referral placed.  [facility] information given as well.  She will think about Wegovy and let me know.  There is no family history of medullary thyroid cancer.  We discussed the side effects.  Declined flu.     Terisa Starr, MD  Family Medicine Teaching Service  [facility]

## 2023-03-30 ENCOUNTER — Ambulatory Visit: Payer: BC Managed Care – PPO | Admitting: Family Medicine

## 2023-03-30 ENCOUNTER — Encounter: Payer: Self-pay | Admitting: Family Medicine

## 2023-03-30 VITALS — BP 146/89 | HR 92 | Ht 64.0 in | Wt 283.4 lb

## 2023-03-30 DIAGNOSIS — Z23 Encounter for immunization: Secondary | ICD-10-CM | POA: Diagnosis not present

## 2023-03-30 DIAGNOSIS — Z91010 Allergy to peanuts: Secondary | ICD-10-CM | POA: Diagnosis not present

## 2023-03-30 DIAGNOSIS — I10 Essential (primary) hypertension: Secondary | ICD-10-CM

## 2023-03-30 DIAGNOSIS — R7303 Prediabetes: Secondary | ICD-10-CM

## 2023-03-30 MED ORDER — VALSARTAN-HYDROCHLOROTHIAZIDE 80-12.5 MG PO TABS
1.0000 | ORAL_TABLET | Freq: Every day | ORAL | 3 refills | Status: DC
Start: 2023-03-30 — End: 2023-05-05

## 2023-03-30 MED ORDER — EPINEPHRINE 0.3 MG/0.3ML IJ SOAJ
0.3000 mg | INTRAMUSCULAR | 1 refills | Status: AC | PRN
Start: 2023-03-30 — End: ?

## 2023-03-30 MED ORDER — SHINGRIX 50 MCG/0.5ML IM SUSR
0.5000 mL | INTRAMUSCULAR | 0 refills | Status: DC
Start: 2023-03-30 — End: 2023-03-30

## 2023-03-30 MED ORDER — SHINGRIX 50 MCG/0.5ML IM SUSR
0.5000 mL | INTRAMUSCULAR | 0 refills | Status: AC
Start: 2023-03-30 — End: 2023-05-30

## 2023-03-30 NOTE — Assessment & Plan Note (Signed)
Not at goal as currently not taking medications regularly.  Refilled medicines.  Repeat BMP at follow-up.  Checked her cuff against our in office cuff her cuff does read a few systolic points higher than ours.  Follow-up 2 to 3 weeks for this.

## 2023-03-30 NOTE — Assessment & Plan Note (Addendum)
Not at goal as currently not taking medications regularly.  Refilled medicines.  Repeat BMP at follow-up.  Checked her cuff against our in office cuff her cuff does read a few systolic points higher than ours.  Follow-up 2 to 3 weeks for this.

## 2023-03-30 NOTE — Patient Instructions (Addendum)
It was wonderful to see you today.  Please bring ALL of your medications with you to every visit.   Today we talked about:  -- Taking your blood pressure pill every day  -- Please schedule follow up with Dr. Raymondo Band (pharmacy) or me to recheck BP in 2-3 weeks  -- We talked about weight loss - You are already doing a GREAT job  - Please message me if you would like to start wegovy  I recommend the Shingles vaccine. This prevents Shingles (reactivation of chicken pox). You can get this at your pharmacy.  For weight management: Call Dr. Gerilyn Pilgrim (our nutritionist) to set up an appointment. Her phone number is: [phone].  We will check your long term marker of diabetes today   Please follow up in 2-3 weeks  Thank you for choosing [facility].   Please call [phone] with any questions about today's appointment.  Please be sure to schedule follow up at the front  desk before you leave today.   Terisa Starr, MD  Family Medicine

## 2023-03-30 NOTE — Assessment & Plan Note (Addendum)
The patient has tried and failed multiple dietary changes, increasing activity.  A1c today.  Discussed options including nutrition medication surgery.  She is interested in nutrition.  Information given for Dr. Gerilyn Pilgrim.  Referral placed.  [facility] information given as well.  She will think about Wegovy and let me know.  There is no family history of medullary thyroid cancer.  We discussed the side effects.

## 2023-03-31 LAB — HEMOGLOBIN A1C
Est. average glucose Bld gHb Est-mCnc: 137 mg/dL
Hgb A1c MFr Bld: 6.4 % — ABNORMAL HIGH (ref 4.8–5.6)

## 2023-04-25 ENCOUNTER — Emergency Department (HOSPITAL_BASED_OUTPATIENT_CLINIC_OR_DEPARTMENT_OTHER): Payer: BC Managed Care – PPO

## 2023-04-25 ENCOUNTER — Other Ambulatory Visit: Payer: Self-pay

## 2023-04-25 ENCOUNTER — Emergency Department (HOSPITAL_BASED_OUTPATIENT_CLINIC_OR_DEPARTMENT_OTHER)
Admission: EM | Admit: 2023-04-25 | Discharge: 2023-04-25 | Disposition: A | Discharge status: Home / Self Care | Payer: BC Managed Care – PPO | Attending: Emergency Medicine | Admitting: Emergency Medicine

## 2023-04-25 ENCOUNTER — Encounter (HOSPITAL_BASED_OUTPATIENT_CLINIC_OR_DEPARTMENT_OTHER): Payer: Self-pay

## 2023-04-25 DIAGNOSIS — R6884 Jaw pain: Secondary | ICD-10-CM | POA: Diagnosis not present

## 2023-04-25 DIAGNOSIS — R519 Headache, unspecified: Secondary | ICD-10-CM | POA: Diagnosis not present

## 2023-04-25 DIAGNOSIS — R110 Nausea: Secondary | ICD-10-CM | POA: Insufficient documentation

## 2023-04-25 DIAGNOSIS — M79604 Pain in right leg: Secondary | ICD-10-CM | POA: Diagnosis present

## 2023-04-25 DIAGNOSIS — I10 Essential (primary) hypertension: Secondary | ICD-10-CM | POA: Diagnosis not present

## 2023-04-25 MED ORDER — ONDANSETRON 4 MG PO TBDP
4.0000 mg | ORAL_TABLET | Freq: Once | ORAL | Status: DC
  Filled 2023-04-25: qty 1

## 2023-04-25 MED ORDER — ONDANSETRON 4 MG PO TBDP
4.0000 mg | ORAL_TABLET | Freq: Once | ORAL | Status: AC
  Administered 2023-04-25: 4 mg via ORAL

## 2023-04-25 MED ORDER — IBUPROFEN 800 MG PO TABS
800.0000 mg | ORAL_TABLET | Freq: Once | ORAL | Status: AC
  Administered 2023-04-25: 800 mg via ORAL
  Filled 2023-04-25: qty 1

## 2023-04-25 MED ORDER — NAPROXEN 250 MG PO TABS: 500.0000 mg | ORAL_TABLET | Freq: Once | ORAL | Status: DC

## 2023-04-25 NOTE — ED Triage Notes (Signed)
Pt reports discomfort and pain in the RT posterior leg that is in thigh and extends down to toes. Pt concerned for blood clot. Pt recently started on BP meds. No recent travel; non smoker. Pt reports RT leg feels tighter than the LT. Also endorses nausea; endorses RT ear pain, LT temple pain.

## 2023-04-25 NOTE — ED Provider Notes (Signed)
Patient presents with some fullness to her right leg.  She was concerned about a blood clot.  Ultrasound shows no evidence of DVT.  No suggestions of infection.  She had some vague symptoms of a headache and some nausea.  No ongoing symptoms.  No symptoms that would be more concerning for ACS.  EKG is nonconcerning.  Patient was discharged home in good condition.  Will follow-up with her PCP.   Rolan Bucco, MD 04/25/23 [phone]

## 2023-04-25 NOTE — ED Provider Notes (Signed)
[facility]-EMERGENCY DEPT [facility] Emergency Department Provider Note MRN:  [id number]  Arrival date & time: 04/25/23     Chief Complaint   Leg Pain   History of Present Illness   Jodi Saunders is a 52 y.o. year-old female with a history of HTN presenting to the ED with chief complaint of leg pain.  Woke up with right leg feeling full, not normal.  Feels tightness in the right knee with radiation of pain down the leg.  Denies back pain.  Concerned about a blood clot.  Also having a dull headache this morning with some nausea, pain to the right jaw.  No numbness or weakness to the arms or legs, no fever, no chest pain or shortness of breath, no abdominal pain.  Review of Systems  A thorough review of systems was obtained and all systems are negative except as noted in the HPI and PMH.   Patient's Health History    Past Medical History:  Diagnosis Date   Anemia    Aortic atherosclerosis (HCC) 05/2021   Hypertension     Past Surgical History:  Procedure Laterality Date   ABDOMINAL HYSTERECTOMY     APPENDECTOMY     BREAST BIOPSY Left 08/2016   CHOLECYSTECTOMY  2011   CYSTOSCOPY Bilateral 05/13/2019   Procedure: CYSTOSCOPY;  Surgeon: Willodean Rosenthal, MD;  Location: [facility];  Service: Gynecology;  Laterality: Bilateral;   HYSTEROSCOPY WITH NOVASURE  06/03/2012   Procedure: HYSTEROSCOPY WITH NOVASURE;  Surgeon: Catalina Antigua, MD;  Location: [facility];  Service: Gynecology;  Laterality: N/A;   LAPAROSCOPIC APPENDECTOMY N/A 12/14/2015   Procedure: APPENDECTOMY LAPAROSCOPIC CONVERTED TO OPEN;  Surgeon: Chevis Pretty III, MD;  Location: [facility];  Service: General;  Laterality: N/A;   MULTIPLE TOOTH EXTRACTIONS     ORIF RADIUS & ULNA FRACTURES  2002   ROBOTIC ASSISTED TOTAL HYSTERECTOMY Bilateral 05/13/2019   Procedure: XI ROBOTIC ASSISTED TOTAL HYSTERECTOMY WITH SALPINGECTOMY, LYSIS OF ADHESION;  Surgeon: Willodean Rosenthal, MD;  Location:  [facility] ;  Service: Gynecology;  Laterality: Bilateral;   TONSILLECTOMY  2008   VAGINAL DELIVERY  1992   WISDOM TOOTH EXTRACTION      Family History  Problem Relation Age of Onset   Hypertension Mother    Diabetes Father    Hypertension Father    Renal cancer Brother    Colon cancer Brother    Diabetes Paternal Grandmother    Hypertension Paternal Grandmother    Diabetes Paternal Grandfather    Hypertension Paternal Grandfather    Allergic rhinitis Neg Hx    Angioedema Neg Hx    Asthma Neg Hx    Atopy Neg Hx    Eczema Neg Hx    Immunodeficiency Neg Hx    Urticaria Neg Hx     Social History   Socioeconomic History   Marital status: Single    Spouse name: Not on file   Number of children: Not on file   Years of education: Not on file   Highest education level: Not on file  Occupational History   Not on file  Tobacco Use   Smoking status: Never   Smokeless tobacco: Never  Vaping Use   Vaping status: Never Used  Substance and Sexual Activity   Alcohol use: Not Currently   Drug use: No   Sexual activity: Not on file  Other Topics Concern   Not on file  Social History Narrative   Not on file   Social  Determinants of Health   Financial Resource Strain: Not on file  Food Insecurity: Not on file  Transportation Needs: Not on file  Physical Activity: Not on file  Stress: Not on file  Social Connections: Not on file  Intimate Partner Violence: Not on file     Physical Exam   Vitals:   04/25/23 0630  BP: (!) 163/103  Pulse: 87  Resp: 18  Temp: 98.1 F (36.7 C)  SpO2: 100%    CONSTITUTIONAL: Well-appearing, NAD NEURO/PSYCH:  Alert and oriented x 3, normal and symmetric strength and sensation, normal coordination, normal speech EYES:  eyes equal and reactive ENT/NECK:  no LAD, no JVD CARDIO: Regular rate, well-perfused, normal S1 and S2 PULM:  CTAB no wheezing or rhonchi GI/GU:  non-distended, non-tender MSK/SPINE:  No gross  deformities, no edema SKIN:  no rash, atraumatic   *Additional and/or pertinent findings included in MDM below  Diagnostic and Interventional Summary    EKG Interpretation Date/Time:  Wednesday April 25 2023 06:50:39 EST Ventricular Rate:  74 PR Interval:  188 QRS Duration:  93 QT Interval:  398 QTC Calculation: 442 R Axis:   -16  Text Interpretation: Sinus rhythm Borderline left axis deviation Low voltage, precordial leads Abnormal R-wave progression, early transition Borderline T wave abnormalities Confirmed by Kennis Carina [id number]) on 04/25/2023 6:54:33 AM       Labs Reviewed - No data to display  US Venous Img Lower Unilateral Right    (Results Pending)    Medications  ibuprofen (ADVIL) tablet 800 mg (800 mg Oral Given 04/25/23 0643)  ondansetron (ZOFRAN-ODT) disintegrating tablet 4 mg (4 mg Oral Given 04/25/23 5621)     Procedures  /  Critical Care Procedures  ED Course and Medical Decision Making  Initial Impression and Ddx Headache, nausea, jaw pain favoring benign etiologies such as tension headache/MSK.  Will obtain screening EKG given the nausea with jaw pain.  With normal neurological exam no indication for imaging at this time, will reassess after medications.  Highly doubt cardiopulmonary emergency.  Patient's leg appears normal, favoring arthritis but given the sensation of fullness and the pain going down into the lower leg will obtain DVT Doppler study.  Neurovascularly intact, nothing to suggest infection or vascular insufficiency.  Past medical/surgical history that increases complexity of ED encounter: None  Interpretation of Diagnostics Ultrasound pending  Patient Reassessment and Ultimate Disposition/Management     Signed out to oncoming provider at shift change.  Patient management required discussion with the following services or consulting groups:  None  Complexity of Problems Addressed Acute complicated illness or Injury  Additional Data  Reviewed and Analyzed Further history obtained from: None  Additional Factors Impacting ED Encounter Risk None  Elmer Sow. Pilar Plate, MD [facility] Emergency Medicine [facility] [email or web address]  Final Clinical Impressions(s) / ED Diagnoses     ICD-10-CM   1. Right leg pain  M79.604       ED Discharge Orders     None        Discharge Instructions Discussed with and Provided to Patient:   Discharge Instructions   None      Sabas Sous, MD 04/25/23 [phone]

## 2023-05-03 NOTE — Progress Notes (Signed)
    SUBJECTIVE:   CHIEF COMPLAINT: BP check HPI:   Jodi Saunders is a 52 y.o.  with history notable for HTN and difficulty with losing weight presenting for follow up.  She reports she is seeing [facility] with Dr. Kerry Hough. Had intake visit this week.  She is interested in decreasing her medications overall not interested in [place] at this time.  She is making dedicated lifestyle changes with her mom.  Her mom also sees the same weight loss clinic.   The patient reports she did take her blood pressure pill today.  Her blood pressure at her visit at [facility] was 142 on the systolic number.  Denies headaches chest pain or dyspnea.  She previously did a 24-hour monitor and feel as though the cuff did not fit properly.  She reports work is still busy.  She has a conference she is to attend today..  Patient was recently in the ER for some leg swelling.  This is resolved DVT ultrasound was negative.  She reports she thinks this is due to dietary changes that she had that day related to some work events.  PERTINENT  PMH / PSH/Family/Social History : HTN, obesity   OBJECTIVE:   BP (!) 164/95   Pulse 84   Ht 5\' 5"  (1.651 m)   Wt 283 lb (128.4 kg)   LMP 04/25/2019   SpO2 98%   BMI 47.09 kg/m   Today's weight:  Last Weight  Most recent update: 05/04/2023  8:33 AM    Weight  128.4 kg (283 lb)            Review of prior weights: Filed Weights   05/04/23 0833  Weight: 283 lb (128.4 kg)    HEENT: EOMI. Sclera without injection or icterus. MMM. External auditory canal examined and WNL. TM normal appearance, no erythema or bulging. Neck: Supple.  Cardiac: Regular rate and rhythm. Normal S1/S2. No murmurs, rubs, or gallops appreciated. Lungs: Clear bilaterally to ascultation.  Psych: Pleasant and appropriate    ASSESSMENT/PLAN:   Assessment & Plan Essential hypertension Not at goal.  BMP today.  Pending electrolytes will increase her combination pill to  valsartan 160, HCTZ 25.  Follow-up in 2 to 3 weeks as she will need repeat BMP. Severe obesity (HCC) Following with [facility]. Aortic atherosclerosis (HCC) She is making dramatic lifestyle changes and will defer statin at this time.  Healthcare maintenance, reminded to get shingles vaccine.  At follow-up remind about mammogram. Terisa Starr, MD  Family Medicine Teaching Service  [facility]

## 2023-05-04 ENCOUNTER — Encounter: Payer: Self-pay | Admitting: Family Medicine

## 2023-05-04 ENCOUNTER — Ambulatory Visit (INDEPENDENT_AMBULATORY_CARE_PROVIDER_SITE_OTHER): Payer: BC Managed Care – PPO | Admitting: Family Medicine

## 2023-05-04 VITALS — BP 164/95 | HR 84 | Ht 65.0 in | Wt 283.0 lb

## 2023-05-04 DIAGNOSIS — I10 Essential (primary) hypertension: Secondary | ICD-10-CM

## 2023-05-04 DIAGNOSIS — I700 Atherosclerosis of aorta: Secondary | ICD-10-CM

## 2023-05-04 NOTE — Patient Instructions (Addendum)
It was wonderful to see you today.  Please bring ALL of your medications with you to every visit.   Today we talked about:   We will check labs today.  If everything looks good, I recommend increasing your blood pressure pill to 2 tabs each day   Please remember to get your shingles vaccine!    Please follow up in 1 months   Thank you for choosing [facility] Medicine.   Please call [phone] with any questions about today's appointment.  Please be sure to schedule follow up at the front  desk before you leave today.   Terisa Starr, MD  Family Medicine

## 2023-05-04 NOTE — Assessment & Plan Note (Signed)
She is making dramatic lifestyle changes and will defer statin at this time.

## 2023-05-04 NOTE — Assessment & Plan Note (Signed)
Following with [facility].

## 2023-05-05 ENCOUNTER — Telehealth: Payer: Self-pay | Admitting: Family Medicine

## 2023-05-05 LAB — BASIC METABOLIC PANEL
BUN/Creatinine Ratio: 13 (ref 9–23)
BUN: 9 mg/dL (ref 6–24)
CO2: 25 mmol/L (ref 20–29)
Calcium: 9.5 mg/dL (ref 8.7–10.2)
Chloride: 99 mmol/L (ref 96–106)
Creatinine, Ser: 0.68 mg/dL (ref 0.57–1.00)
Glucose: 111 mg/dL — ABNORMAL HIGH (ref 70–99)
Potassium: 4.1 mmol/L (ref 3.5–5.2)
Sodium: 137 mmol/L (ref 134–144)
eGFR: 105 mL/min/{1.73_m2} (ref >59)

## 2023-05-05 MED ORDER — VALSARTAN-HYDROCHLOROTHIAZIDE 160-25 MG PO TABS: 1.0000 | ORAL_TABLET | Freq: Every day | ORAL | 1 refills | Status: DC

## 2023-05-05 NOTE — Telephone Encounter (Addendum)
Dose increase of Diovan hydrochlorothiazide  Red team- please call pharmacy and cancel the following dose--- Diovan hydrochlorothiazide 80-12.5   They pharmacy should fill the new Rx for the increased dose of 160-25  Terisa Starr, MD  [facility] Medicine Teaching Service

## 2023-05-07 NOTE — Telephone Encounter (Signed)
Pharmacy called. Deseree Kennon Holter, CMA

## 2023-05-25 NOTE — Progress Notes (Unsigned)
    SUBJECTIVE:   CHIEF COMPLAINT: follow up HPI:   Jodi Saunders is a 52 y.o.  with history notable for HTN and obesity presenting for blood pressure.  She reports overall she is doing great.  She followed up with [facility].  She is down 4 pounds.  Her goal calories per day or 1600.  She goes back every 2 weeks.  Her big goals are to start walking 30 minutes 3 times a week week.  Major barrier right now is the weather.  She is determined to lose weight.  She has a incredible job with making changes.  The patient reports compliance with her home blood pressure medication.  No side effects.  No dizziness chest pain or difficulty breathing.  We recently increased the dose.  She does report that she had hypovitaminosis D at her wellness visit.  Medications adjusted accordingly.    Current Outpatient Medications:    Vitamin D, Ergocalciferol, (DRISDOL) 1.25 MG (50000 UNIT) CAPS capsule, Take 50,000 Units by mouth every 7 (seven) days., Disp: , Rfl:    Biotin 1000 MCG tablet, Take 1,000 mcg by mouth daily., Disp: , Rfl:    cetirizine (ZYRTEC) 10 MG tablet, Take 10 mg by mouth daily as needed for allergies., Disp: , Rfl:    cholecalciferol (VITAMIN D) 1000 units tablet, Take 1 tablet (1,000 Units total) by mouth daily., Disp: 30 tablet, Rfl: 3   EPINEPHrine 0.3 mg/0.3 mL IJ SOAJ injection, Inject 0.3 mg into the muscle as needed for anaphylaxis., Disp: 2 each, Rfl: 1   Omega-3 1000 MG CAPS, Take 1,000 mg by mouth daily. , Disp: , Rfl:    valsartan-hydrochlorothiazide (DIOVAN HCT) 160-25 MG tablet, Take 1 tablet by mouth daily., Disp: 30 tablet, Rfl: 1  PERTINENT  PMH / PSH/Family/Social History :  Mother has a history of thalassemia  OBJECTIVE:   BP 110/68   Pulse 76   Ht 5\' 5"  (1.651 m)   Wt 277 lb 9.6 oz (125.9 kg)   LMP 04/25/2019   SpO2 100%   BMI 46.20 kg/m   Today's weight:  Last Weight  Most recent update: 06/04/2023  8:31 AM    Weight   125.9 kg (277 lb 9.6 oz)            Review of prior weights: Filed Weights   06/04/23 0831  Weight: 277 lb 9.6 oz (125.9 kg)     Cardiac: Regular rate and rhythm. Normal S1/S2. No murmurs, rubs, or gallops appreciated. Lungs: Clear bilaterally to ascultation.  Abdomen: Normoactive bowel sounds. No tenderness to deep or light palpation. No rebound or guarding.   Psych: Pleasant and appropriate    ASSESSMENT/PLAN:   Assessment & Plan Hypertension, unspecified type Blood pressure goal.  Recently increased medication.  BMP today.  Obesity morbid, BMI greater than 40.  We discussed possibly getting a treadmill at work.  Reviewed protein goals and how to read labels from places like cookout  Healthcare maintenance discussed negative #2 mammogram.  We also discussed advance directive and a packet was given today.  Terisa Starr, MD  Family Medicine Teaching Service  [facility]

## 2023-06-04 ENCOUNTER — Ambulatory Visit: Payer: BC Managed Care – PPO | Admitting: Family Medicine

## 2023-06-04 ENCOUNTER — Encounter: Payer: Self-pay | Admitting: Family Medicine

## 2023-06-04 VITALS — BP 110/68 | HR 76 | Ht 65.0 in | Wt 277.6 lb

## 2023-06-04 DIAGNOSIS — I10 Essential (primary) hypertension: Secondary | ICD-10-CM

## 2023-06-04 NOTE — Patient Instructions (Addendum)
It was wonderful to see you today.  Please bring ALL of your medications with you to every visit.   Today we talked about: GREAT JOB WITH YOUR HEALTH     I will message you with labs   Please complete your advanced directive   Be sure to get your mammogram   I recommend you undergo a mammogram.   You can call to schedule an appointment by calling [phone].   Directions [address] [place], [place] [address]  Please let me know if you have questions. I will send you a letter or call you with results.    Please follow up in 3 months   Thank you for choosing [facility].   Please call [phone] with any questions about today's appointment.  Please be sure to schedule follow up at the front  desk before you leave today.   Terisa Starr, MD  Family Medicine

## 2023-06-04 NOTE — Assessment & Plan Note (Signed)
Blood pressure goal.  Recently increased medication.  BMP today.

## 2023-06-05 LAB — BASIC METABOLIC PANEL
BUN/Creatinine Ratio: 11 (ref 9–23)
BUN: 10 mg/dL (ref 6–24)
CO2: 24 mmol/L (ref 20–29)
Calcium: 9.6 mg/dL (ref 8.7–10.2)
Chloride: 99 mmol/L (ref 96–106)
Creatinine, Ser: 0.9 mg/dL (ref 0.57–1.00)
Glucose: 111 mg/dL — ABNORMAL HIGH (ref 70–99)
Potassium: 4.5 mmol/L (ref 3.5–5.2)
Sodium: 140 mmol/L (ref 134–144)
eGFR: 77 mL/min/{1.73_m2} (ref >59)

## 2023-06-25 ENCOUNTER — Other Ambulatory Visit: Payer: Self-pay

## 2023-06-26 MED ORDER — VALSARTAN-HYDROCHLOROTHIAZIDE 160-25 MG PO TABS: 1.0000 | ORAL_TABLET | Freq: Every day | ORAL | 3 refills | Status: AC

## 2023-09-06 NOTE — Progress Notes (Deleted)
 [facility] Telemedicine Visit  Patient consented to have virtual visit and was identified by name and date of birth. Method of visit: {TELEPHONE VS VHQIO:96295}  Encounter participants: Patient: Jodi Saunders - located at *** Provider: Westley Chandler - located at *** Others (if applicable): ***  Chief Complaint: ***  HPI:  ***  ROS: per HPI  Pertinent PMHx: ***  Exam:  LMP 04/25/2019   Respiratory: ***  Assessment/Plan:  No problem-specific Assessment & Plan notes found for this encounter.    Time spent during visit with patient: *** minutes  {Billing info - this will automatically delete when the note is signed:1} {For Audio only, bill [id number] / [phone] as usual with modifier 93 attached:1} {For Audio & Video, bill [id number] / [phone] as usual with modifier 95 attached:1}

## 2023-09-07 ENCOUNTER — Encounter: Payer: Self-pay | Admitting: Family Medicine

## 2023-09-07 ENCOUNTER — Ambulatory Visit: Payer: BC Managed Care – PPO | Admitting: Family Medicine

## 2023-09-07 VITALS — BP 132/87 | HR 83 | Ht 65.0 in | Wt 275.4 lb

## 2023-09-07 DIAGNOSIS — K635 Polyp of colon: Secondary | ICD-10-CM | POA: Diagnosis not present

## 2023-09-07 DIAGNOSIS — M62838 Other muscle spasm: Secondary | ICD-10-CM | POA: Diagnosis not present

## 2023-09-07 DIAGNOSIS — R7303 Prediabetes: Secondary | ICD-10-CM

## 2023-09-07 DIAGNOSIS — I10 Essential (primary) hypertension: Secondary | ICD-10-CM | POA: Diagnosis not present

## 2023-09-07 LAB — POCT GLYCOSYLATED HEMOGLOBIN (HGB A1C): Hemoglobin A1C: 6.3 % — AB (ref 4.0–5.6)

## 2023-09-07 MED ORDER — METHOCARBAMOL 500 MG PO TABS: 500.0000 mg | ORAL_TABLET | Freq: Three times a day (TID) | ORAL | 0 refills | Status: AC | PRN

## 2023-09-07 NOTE — Assessment & Plan Note (Addendum)
 Due for colonoscopy in June, ordered today, follows with [facility] Had FIT test at [facility] last year, history of polyps precludes this test given risk

## 2023-09-07 NOTE — Assessment & Plan Note (Signed)
-   At goal, continue current medication

## 2023-09-07 NOTE — Patient Instructions (Signed)
 It was wonderful to see you today.  Please bring ALL of your medications with you to every visit.   Today we talked about:  I will call you with her your A1c  Take time for yourself   I recommend you undergo a mammogram.   You can call to schedule an appointment by calling [phone].   Directions [address] [place], [place] [address]  Please let me know if you have questions. I will send you a letter or call you with results.     I have referred you to Colonoscopy   to further evaluate your concern. If you do not received a phone call about this appointment within 3-4 weeks, please call our office back at [phone]. Clemencia Course coordinates our referrals and can assist you in this.   Please follow up in 3 months   Thank you for choosing [facility] Medicine.   Please call [phone] with any questions about today's appointment.  Please be sure to schedule follow up at the front  desk before you leave today.   Terisa Starr, MD  Family Medicine

## 2023-09-07 NOTE — Progress Notes (Signed)
    SUBJECTIVE:   CHIEF COMPLAINT: HTN HPI:   Jodi Saunders is a 53 y.o.  with history notable for HTN  presenting for follow up and neck pain. She reports she is okay. On FMLA this week for her mother's hip replacement.   Neck pain Started this week. Has taken 1 muscle relaxer and when to chiropractor. Feels tighter on L than R. Improved today after interventions listed. No weakness or numbness. Normal urinary/stool pattern. No trauma. Relates to stress of 1. Mother's hip 2. Work and pay roll.  She is REALLY working on dietary changes. She goes to [employer]. Per reports, Has increased activity, increased leafy greens, working on protein. Due for A1C. No polyuria or polydipsia. Weight is down 2 pounds since last visit!   Reports compliance with medications.   No change in stool or bleeding.  PERTINENT  PMH / PSH/Family/Social History : family history of CRC   OBJECTIVE:   BP 132/87   Pulse 83   Ht 5\' 5"  (1.651 m)   Wt 275 lb 6.4 oz (124.9 kg)   LMP 04/25/2019   SpO2 99%   BMI 45.83 kg/m   Today's weight:  Last Weight  Most recent update: 09/07/2023  8:39 AM    Weight  124.9 kg (275 lb 6.4 oz)            Review of prior weights: [employer]   09/07/23 0837  Weight: 275 lb 6.4 oz (124.9 kg)    RRR Lungs clear Bilateral tenderness along trapezius mostly in body  Normal strength in UE  Negative Spurlings  Normal gait   ASSESSMENT/PLAN:   Assessment & Plan Hyperplastic polyp of ascending colon Due for colonoscopy in June, ordered today, follows with [facility], history of polyps precludes this test given risk  Hypertension, unspecified type At goal, continue current medication  Prediabetes A1C today  Trapezius muscle spasm Likely above diagnosis, no trauma or evidence of radiculopathy Discussed conservative measures, RX Robaxin, will not take with alcohol or driving   HCM Mammogram information provided today,  she will schedule  Terisa Starr, MD  Family Medicine Teaching Service  [facility]

## 2023-09-14 ENCOUNTER — Ambulatory Visit
Admission: RE | Admit: 2023-09-14 | Discharge: 2023-09-14 | Disposition: A | Discharge status: Home / Self Care | Source: Ambulatory Visit | Attending: Family Medicine | Admitting: Family Medicine

## 2023-09-14 DIAGNOSIS — Z1231 Encounter for screening mammogram for malignant neoplasm of breast: Secondary | ICD-10-CM

## 2023-09-18 ENCOUNTER — Encounter: Payer: Self-pay | Admitting: Family Medicine

## 2023-09-25 ENCOUNTER — Ambulatory Visit: Admitting: Student

## 2023-09-25 VITALS — BP 118/80 | HR 98 | Temp 98.4°F | Ht 65.0 in | Wt 276.6 lb

## 2023-09-25 DIAGNOSIS — M79672 Pain in left foot: Secondary | ICD-10-CM

## 2023-09-25 NOTE — Patient Instructions (Signed)
 The blister between your toes is most likely from your foot mechanics.  I recommend you use good arch support.  If this continues to be a problem we can send you to sports medicine to get custom orthotics.  For the blister you can keep the piece of gauze in between for comfort.  Do not pop or try to do the roof of the blister as this can introduce infection.  If this continues to get worse or you start having more pain please schedule another appointment to be seen.  We have documentation in your chart of what your blister looks like today so we can compare it if it gets worse.

## 2023-09-25 NOTE — Progress Notes (Signed)
    SUBJECTIVE:   CHIEF COMPLAINT / HPI:   Jodi Saunders is a 53 y.o. female presenting for blister between the left 2nd and 3rd toe.  Patient reports the blister popped up this morning.  She reports she has been wearing her regular shoes or crocs.  She reports she has not had any wet shoes or wet socks that have been worn recently.  There is pain around the blister but it does not extend up into the foot.  She has a history of pes planus which she has had orthotics in the past.  PERTINENT  PMH / PSH: reviewed and updated.  OBJECTIVE:   BP 118/80   Pulse 98   Temp 98.4 F (36.9 C)   Ht 5\' 5"  (1.651 m)   Wt 276 lb 9.6 oz (125.5 kg)   LMP 04/25/2019   SpO2 99%   BMI 46.03 kg/m   Well-appearing, no acute distress Cardio: Regular rate, regular rhythm, no murmurs on exam. Pulm: Clear, no wheezing, no crackles. No increased work of breathing  Left foot: Blister present on proximal webspace of the 2nd and 3rd toe.  Foot has beginnings of bunion with very low arch.  No pain to palpation in 2nd or 3rd MTP joint, foot range of motion within normal limits.    ASSESSMENT/PLAN:   Assessment & Plan Left foot pain Because a blister seems to be foot mechanics as she is beginning to get a bunion on her left side more pressure is being placed on the 2nd and 3rd toes.  Discussed care of blister by not deroofing, placing a gauze in between 2nd and 3rd toe and wearing supportive shoes.  Picture placed in chart.  Return precautions discussed if pain becomes worse, erythema becomes worse or if she has trouble ambulating.  Suspect that she may need to be sent to sports medicine for orthotics.     Glendale Chard, DO [facility]

## 2023-11-03 ENCOUNTER — Encounter: Payer: Self-pay | Admitting: Podiatry

## 2023-11-03 ENCOUNTER — Ambulatory Visit: Admitting: Podiatry

## 2023-11-03 DIAGNOSIS — M2142 Flat foot [pes planus] (acquired), left foot: Secondary | ICD-10-CM | POA: Diagnosis not present

## 2023-11-03 DIAGNOSIS — M2141 Flat foot [pes planus] (acquired), right foot: Secondary | ICD-10-CM

## 2023-11-03 NOTE — Progress Notes (Signed)
 Chief Complaint  Patient presents with   Nail Problem    "I'm here to have an overall foot check.  I do have ingrowns that I deal with from time to time." N - foot check, ingrown toenails L - hallux bilateral; rt > lt D - 2-3 yerars O - slowly worse C - tight like pressure, pinch A - get too long T - dig them out or get a Pedicure    HPI: 53 y.o. female presenting today as a new patient for evaluation of chronic history of flatfoot deformity.  Currently asymptomatic.  She has no pain or tenderness to the feet.  Is a high school student she states that she wore orthotics but has not worn them since she graduated high school.  Past Medical History:  Diagnosis Date   Anemia    Aortic atherosclerosis (HCC) 05/2021   Hypertension     Past Surgical History:  Procedure Laterality Date   ABDOMINAL HYSTERECTOMY     APPENDECTOMY     BREAST BIOPSY Left 08/2016   CHOLECYSTECTOMY  2011   CYSTOSCOPY Bilateral 05/13/2019   Procedure: CYSTOSCOPY;  Surgeon: Lenord Radon, MD;  Location: [facility];  Service: Gynecology;  Laterality: Bilateral;   HYSTEROSCOPY WITH NOVASURE  06/03/2012   Procedure: HYSTEROSCOPY WITH NOVASURE;  Surgeon: Verlyn Goad, MD;  Location: [facility];  Service: Gynecology;  Laterality: N/A;   LAPAROSCOPIC APPENDECTOMY N/A 12/14/2015   Procedure: APPENDECTOMY LAPAROSCOPIC CONVERTED TO OPEN;  Surgeon: Lillette Reid III, MD;  Location: [facility];  Service: General;  Laterality: N/A;   MULTIPLE TOOTH EXTRACTIONS     ORIF RADIUS & ULNA FRACTURES  2002   ROBOTIC ASSISTED TOTAL HYSTERECTOMY Bilateral 05/13/2019   Procedure: XI ROBOTIC ASSISTED TOTAL HYSTERECTOMY WITH SALPINGECTOMY, LYSIS OF ADHESION;  Surgeon: Lenord Radon, MD;  Location: [facility];  Service: Gynecology;  Laterality: Bilateral;   TONSILLECTOMY  2008   VAGINAL DELIVERY  1992   WISDOM TOOTH EXTRACTION      Allergies  Allergen Reactions   Guggulipid-Black Pepper  Other (See Comments)    Congestion.   Onion Other (See Comments)    Congestion   Other Other (See Comments)    Tree nuts. Facial swelling of lips and tongue. Denies airway involvement.    Sulfonamide Derivatives Nausea And Vomiting    Lightheaded.   Latex Rash    Blisters     Physical Exam: General: The patient is alert and oriented x3 in no acute distress.  Dermatology: Skin is warm, dry and supple bilateral lower extremities.   Vascular: Palpable pedal pulses bilaterally. Capillary refill within normal limits.  No appreciable edema.  No erythema.  Neurological: Grossly intact via light touch  Musculoskeletal Exam: No pedal deformities noted.  Muscle strength 5/5 all compartments.  No tenderness with palpation throughout the foot.  Range of motion within normal limits.  Mild flatfoot deformity noted with weightbearing  Assessment/Plan of Care: 1.  Mild flatfoot deformity  -Patient evaluated -Recommend that patient wears good supportive tennis shoes with arch support.  Advised against going barefoot -Maintain good foot hygiene -Return to clinic PRN     Dot Gazella, DPM [facility]  Dr. Dot Gazella, DPM    [address]  [place], [place] [address]                Office [phone]  Fax [phone]

## 2023-11-22 ENCOUNTER — Encounter: Payer: Self-pay | Admitting: Family Medicine

## 2023-11-22 LAB — HM COLONOSCOPY

## 2023-11-26 ENCOUNTER — Encounter: Payer: Self-pay | Admitting: Family Medicine

## 2023-11-26 NOTE — Telephone Encounter (Signed)
 Please schedule a visit to discuss ear issues. Can be with any physician.   Otho Blitz, MD  Family Medicine Teaching Service

## 2023-11-27 ENCOUNTER — Encounter: Payer: Self-pay | Admitting: Student

## 2023-11-27 ENCOUNTER — Ambulatory Visit (INDEPENDENT_AMBULATORY_CARE_PROVIDER_SITE_OTHER): Admitting: Student

## 2023-11-27 VITALS — BP 134/86 | HR 95 | Ht 64.0 in | Wt 278.0 lb

## 2023-11-27 DIAGNOSIS — H6992 Unspecified Eustachian tube disorder, left ear: Secondary | ICD-10-CM | POA: Diagnosis not present

## 2023-11-27 NOTE — Progress Notes (Signed)
    SUBJECTIVE:   CHIEF COMPLAINT / HPI:   Jodi Saunders is a 53 year old female with a history of Eustachian tube dysfunction who presents with left ear pressure and congestion.  She experiences pressure in her left ear extending to the entire left side of her face, with nasal congestion, postnasal drip, and frequent coughing. These symptoms have persisted since an ear infection last year. There is no pain, tinnitus, or hearing loss.  Various treatments have been attempted, including allergy pills, nasal inhalers, and Flonase , though Flonase  causes significant burning. Saline sinus rinse and an inhaler provide relief, and chiropractic adjustments offer temporary relief, especially during high pollen counts.  She experiences a runny nose primarily on the left side, which can affect both sides when pollen counts are high. There is no otorrhea.  PERTINENT  PMH / PSH: Reviewed   OBJECTIVE:   BP 134/86   Pulse 95   Ht 5' 4 (1.626 m)   Wt 278 lb (126.1 kg)   LMP 04/25/2019   SpO2 100%   BMI 47.72 kg/m    Physical Exam General: Alert, well appearing, NAD HEENT: Normal TM bilaterally, no perisinus tenderness and no nasal polyp  Cardiovascular: RRR, No Murmurs, Normal S2/S2 Respiratory: CTAB, No wheezing or Rales Skin: Warm and dry  ASSESSMENT/PLAN:   Eustachian Tube Dysfunction Chronic left ear pressure and facial congestion with nasal symptoms. Symptoms consistent with Eustachian tube dysfunction. No abnormalities on examination. Referral to ENT for further evaluation due to chronicity and need for advanced diagnostics. - Refer to ENT specialist for further evaluation and management.   Goble Last, MD [facility]

## 2023-11-27 NOTE — Patient Instructions (Signed)
 Pleasure to see you today.  Your exam was generally normal however given that this has been going on for a year I have sent in a referral to see an ENT doctor.

## 2023-11-27 NOTE — Progress Notes (Signed)
 I have seen and examined this patient, and reviewed their chart. I have discussed this patient with the resident. I agree with the resident's findings, assessment and care plan.

## 2023-12-17 ENCOUNTER — Encounter (INDEPENDENT_AMBULATORY_CARE_PROVIDER_SITE_OTHER): Payer: Self-pay

## 2024-01-02 ENCOUNTER — Encounter (INDEPENDENT_AMBULATORY_CARE_PROVIDER_SITE_OTHER): Payer: Self-pay

## 2024-02-27 ENCOUNTER — Ambulatory Visit (INDEPENDENT_AMBULATORY_CARE_PROVIDER_SITE_OTHER): Admitting: Physician Assistant

## 2024-02-27 ENCOUNTER — Encounter (INDEPENDENT_AMBULATORY_CARE_PROVIDER_SITE_OTHER): Payer: Self-pay | Admitting: Physician Assistant

## 2024-02-27 ENCOUNTER — Ambulatory Visit (INDEPENDENT_AMBULATORY_CARE_PROVIDER_SITE_OTHER): Admitting: Audiology

## 2024-02-27 VITALS — BP 150/85 | HR 74 | Ht 64.0 in | Wt 283.2 lb

## 2024-02-27 DIAGNOSIS — Z0110 Encounter for examination of ears and hearing without abnormal findings: Secondary | ICD-10-CM | POA: Diagnosis not present

## 2024-02-27 DIAGNOSIS — H93292 Other abnormal auditory perceptions, left ear: Secondary | ICD-10-CM

## 2024-02-27 DIAGNOSIS — H6992 Unspecified Eustachian tube disorder, left ear: Secondary | ICD-10-CM

## 2024-02-27 DIAGNOSIS — H9390 Unspecified disorder of ear, unspecified ear: Secondary | ICD-10-CM | POA: Diagnosis not present

## 2024-02-27 MED ORDER — FLUTICASONE PROPIONATE 50 MCG/ACT NA SUSP: 2.0000 | Freq: Every day | NASAL | 6 refills | Status: AC

## 2024-02-27 MED ORDER — CETIRIZINE HCL 10 MG PO TABS: 10.0000 mg | ORAL_TABLET | Freq: Every day | ORAL | 11 refills | Status: AC

## 2024-02-27 NOTE — Progress Notes (Signed)
 Dear Dr. Delores, Here is my assessment for our mutual patient, Jodi Saunders. Thank you for allowing me the opportunity to care for your patient. Please do not hesitate to contact me should you have any other questions. Sincerely, Chyrl Cohen PA-C  Otolaryngology Clinic Note Referring provider: Dr. Delores HPI:  Jodi Saunders is a 53 y.o. female kindly referred by Dr. Delores   The patient is a 53 year old female seen in our office for evaluation of ear fullness.  The patient notes no significant history of ear infections, no decreased hearing, no trauma to the ears.  She notes that she had her first ear infection in February 2025 on the left side.  She notes since then she has been having intermittent feelings of fullness or clogging of the left ear.  She notes this is coming and going.  She denies any associated ringing or hearing loss, no dizziness, no associated pain.  She notes that her left ear  will pop when on the airplane, right will not.  She notes a history of seasonal allergies but does not take anything as it is not severe.   Independent Review of Additional Tests or Records:  Audiological evaluation 02/27/2024  Otoscopy: Right ear: Clear external ear canal and notable landmarks visualized on the tympanic membrane. Left ear:  Clear external ear canal and notable landmarks visualized on the tympanic membrane.   Tympanometry: Right ear: Type A- Normal external ear canal volume with normal middle ear pressure and tympanic membrane compliance. Left ear: Type A- Normal external ear canal volume with normal middle ear pressure and tympanic membrane compliance.   Distortion Product Otoacoustic Emissions: Equipment not available.   Pure tone Audiometry: Both ears- Normal hearing from 125 Hz - 8000 Hz.   Speech Audiometry: Right ear- Speech Reception Threshold (SRT) was obtained at 5 dBHL. Left ear-Speech Reception Threshold (SRT) was obtained at 5 dBHL.   Word Recognition Score Tested  using NU-6 (recorded) Right ear: 100% was obtained at a presentation level of 50 dBHL with contralateral masking which is deemed as  excellent. Left ear: 100% was obtained at a presentation level of 50 dBHL with contralateral masking which is deemed as  excellent.   The hearing test results were completed under headphones and results are deemed to be of good reliability. Test technique:  conventional     Impression: There is not a significant difference in pure-tone thresholds between ears.  There is not a significant difference in the word recognition score in between ears.    PMH/Meds/All/SocHx/FamHx/ROS:   Past Medical History:  Diagnosis Date   Anemia    Aortic atherosclerosis (HCC) 05/2021   Hypertension      Past Surgical History:  Procedure Laterality Date   ABDOMINAL HYSTERECTOMY     APPENDECTOMY     BREAST BIOPSY Left 08/2016   CHOLECYSTECTOMY  2011   CYSTOSCOPY Bilateral 05/13/2019   Procedure: CYSTOSCOPY;  Surgeon: Corene Coy, MD;  Location: [facility] [place];  Service: Gynecology;  Laterality: Bilateral;   HYSTEROSCOPY WITH NOVASURE  06/03/2012   Procedure: HYSTEROSCOPY WITH NOVASURE;  Surgeon: Winton Felt, MD;  Location: [facility];  Service: Gynecology;  Laterality: N/A;   LAPAROSCOPIC APPENDECTOMY N/A 12/14/2015   Procedure: APPENDECTOMY LAPAROSCOPIC CONVERTED TO OPEN;  Surgeon: Deward Null III, MD;  Location: [facility];  Service: General;  Laterality: N/A;   MULTIPLE TOOTH EXTRACTIONS     ORIF RADIUS & ULNA FRACTURES  2002   ROBOTIC ASSISTED TOTAL HYSTERECTOMY Bilateral 05/13/2019   Procedure:  XI ROBOTIC ASSISTED TOTAL HYSTERECTOMY WITH SALPINGECTOMY, LYSIS OF ADHESION;  Surgeon: Corene Coy, MD;  Location: [facility];  Service: Gynecology;  Laterality: Bilateral;   TONSILLECTOMY  2008   VAGINAL DELIVERY  1992   WISDOM TOOTH EXTRACTION      Family History  Problem Relation Age of Onset   Hypertension Mother     Thalassemia Mother    Diabetes Father    Hypertension Father    Renal cancer Brother    Colon cancer Brother    Diabetes Paternal Grandmother    Hypertension Paternal Grandmother    Diabetes Paternal Grandfather    Hypertension Paternal Grandfather    Allergic rhinitis Neg Hx    Angioedema Neg Hx    Asthma Neg Hx    Atopy Neg Hx    Eczema Neg Hx    Immunodeficiency Neg Hx    Urticaria Neg Hx      Social Connections: Not on file      Current Outpatient Medications:    Biotin 1000 MCG tablet, Take 1,000 mcg by mouth daily., Disp: , Rfl:    cetirizine  (ZYRTEC ) 10 MG tablet, Take 10 mg by mouth daily as needed for allergies., Disp: , Rfl:    cholecalciferol (VITAMIN D ) 1000 units tablet, Take 1 tablet (1,000 Units total) by mouth daily., Disp: 30 tablet, Rfl: 3   EPINEPHrine  0.3 mg/0.3 mL IJ SOAJ injection, Inject 0.3 mg into the muscle as needed for anaphylaxis., Disp: 2 each, Rfl: 1   methocarbamol  (ROBAXIN ) 500 MG tablet, Take 1 tablet (500 mg total) by mouth every 8 (eight) hours as needed for muscle spasms., Disp: 30 tablet, Rfl: 0   Omega-3 1000 MG CAPS, Take 1,000 mg by mouth daily. , Disp: , Rfl:    valsartan -hydrochlorothiazide  (DIOVAN  HCT) 160-25 MG tablet, Take 1 tablet by mouth daily., Disp: 90 tablet, Rfl: 3   Physical Exam:   BP (!) 150/85 Comment: first attempt 150/85 second attempt 142/81  Pulse 74   Ht 5' 4 (1.626 m)   Wt 283 lb 3.2 oz (128.5 kg)   LMP 04/25/2019   SpO2 96%   BMI 48.61 kg/m   Pertinent Findings  CN II-XII intact Bilateral EAC clear and TM intact with well pneumatized middle ear spaces  Anterior rhinoscopy: Septum midline; bilateral inferior turbinates with moderate hypertrophy  No lesions of oral cavity/oropharynx; dentition wnl No obviously palpable neck masses/lymphadenopathy/thyromegaly No respiratory distress or stridor  Seprately Identifiable Procedures:  None  Impression & Plans:  Jodi Saunders is a 53 y.o. female with the  following   Left ear fullness-  53 year old female seen in our office for evaluation of ear fullness.  Symptoms most consistent with eustachian tube dysfunction.  I would recommend daily antihistamine, Flonase , nasal saline irrigation.  If symptoms persist after 3 months I would like to see her back in the office for repeat evaluation and further management.  Strict return precautions given.   - f/u PRN otherwise 3 months if symptoms persist   Thank you for allowing me the opportunity to care for your patient. Please do not hesitate to contact me should you have any other questions.  Sincerely, Chyrl Cohen PA-C [facility] Phone: [phone] Fax: [phone]  02/27/2024, 10:03 AM

## 2024-02-27 NOTE — Progress Notes (Signed)
  [address], [address] [place], [place] [address] [phone]  Audiological Evaluation    Name: Jodi Saunders     DOB:   February 02, 1971      MRN:   [id number]                                                                                     Service Date: 02/27/2024     Accompanied by: unaccompanied   Patient comes today after Reyes Cohen, PA-C sent a referral for a hearing evaluation due to concerns with left ear fullness that is on and off , but is bothersome.   Symptoms Yes Details  Hearing loss  []    Tinnitus  []    Ear pain/ infections/pressure  [x]  Left ear feels full/like it tickles since [facility] 2025 when she had an ear infection, now it seems to be off/on  Balance problems  []    Noise exposure history  []    Previous ear surgeries  []    Family history of hearing loss  [x]  Aunt was born hearing impaired  Amplification  []    Other  []      Otoscopy: Right ear: Clear external ear canal and notable landmarks visualized on the tympanic membrane. Left ear:  Clear external ear canal and notable landmarks visualized on the tympanic membrane.  Tympanometry: Right ear: Type A- Normal external ear canal volume with normal middle ear pressure and tympanic membrane compliance. Left ear: Type A- Normal external ear canal volume with normal middle ear pressure and tympanic membrane compliance.  Distortion Product Otoacoustic Emissions: Equipment not available.  Pure tone Audiometry: Both ears- Normal hearing from 125 Hz - 8000 Hz.  Speech Audiometry: Right ear- Speech Reception Threshold (SRT) was obtained at 5 dBHL. Left ear-Speech Reception Threshold (SRT) was obtained at 5 dBHL.   Word Recognition Score Tested using NU-6 (recorded) Right ear: 100% was obtained at a presentation level of 50 dBHL with contralateral masking which is deemed as  excellent. Left ear: 100% was obtained at a presentation level of 50 dBHL with contralateral masking which is deemed  as  excellent.   The hearing test results were completed under headphones and results are deemed to be of good reliability. Test technique:  conventional    Impression: There is not a significant difference in pure-tone thresholds between ears.  There is not a significant difference in the word recognition score in between ears.    Recommendations: Follow up with ENT as scheduled for today. Return for a hearing evaluation if concerns with hearing changes arise or per MD recommendation.   Cambry Spampinato MARIE LEROUX-MARTINEZ, AUD

## 2024-03-06 ENCOUNTER — Ambulatory Visit: Payer: Self-pay | Admitting: Family Medicine

## 2024-03-06 ENCOUNTER — Ambulatory Visit: Admitting: Family Medicine

## 2024-03-06 VITALS — BP 125/80 | HR 88 | Ht 64.0 in | Wt 274.4 lb

## 2024-03-06 DIAGNOSIS — R051 Acute cough: Secondary | ICD-10-CM | POA: Diagnosis not present

## 2024-03-06 LAB — POC SOFIA 2 FLU + SARS ANTIGEN FIA
Influenza A, POC: NEGATIVE
Influenza B, POC: NEGATIVE
SARS Coronavirus 2 Ag: NEGATIVE

## 2024-03-06 MED ORDER — AZELASTINE HCL 0.1 % NA SOLN: 2.0000 | Freq: Two times a day (BID) | NASAL | 12 refills | Status: AC

## 2024-03-06 NOTE — Progress Notes (Signed)
    SUBJECTIVE:   CHIEF COMPLAINT / HPI:   Cough, congestion  Since Saturday, reports ongoing cough, congestion, sneezing.  Cough productive of some beige phlegm, no hemoptysis.  Has significant seasonal allergies at baseline too much of the outdoors.  Been taking Zyrtec  daily, tried Flonase  recently but this caused burning.  No fevers.  No nausea vomiting or diarrhea.  No sore throat.  Has been staying hydrated.  Over the last year, patient has had left ear fullness, recently saw ENT with reassuring workup.  PERTINENT  PMH / PSH: HTN  OBJECTIVE:   BP 125/80   Pulse 88   Ht 5' 4 (1.626 m)   Wt 274 lb 6.4 oz (124.5 kg)   LMP 04/25/2019   SpO2 100%   BMI 47.10 kg/m   General: No acute distress.  Resting comfortably in room. HEENT: MMM. Non-erythematous, non-bulging TM bilaterally. Normal appearing oropharynx without tonsillar enlargement or exudate. No cervical lymphadenopathy.  CV: Normal S1/S2. No extra heart sounds. Warm and well-perfused. Pulm: Breathing comfortably on room air. CTAB. No increased WOB. Abd: Soft, non-tender, non-distended. Skin:  Warm, dry. Psych: Pleasant and appropriate.   ASSESSMENT/PLAN:   Assessment & Plan Acute cough Reassuring exam today.  Overall suspect possible viral URI with potential component of chronic allergies.  Low concern for pneumonia given afebrile no focal findings.   - Discussed supportive cold care at home - Trial Astelin  nasal spray instead of Flonase  - Flu/COVID test today -discussed that patient is outside of treatment window, patient elects for testing given close contact with family members and concern for their health   RTC if symptoms are not improving.  Damien Cassis, MD [facility]

## 2024-03-06 NOTE — Patient Instructions (Addendum)
 Thank you for visiting clinic today and allowing us  to participate in your care!  Sorry you're not feeling well! You most likely have a viral upper respiratory infection with a component of allergies as well.   -Please take Tylenol  or Ibuprofen  as needed for fever, aches -Keep staying well-hydrated -Try the Astelin  nasal spray -Steam and nasal rinses may help with congestion -Honey and cough drops may help with cough  -We will let you know the results of your testing today.   Please schedule an appointment as needed.    Reach out any time with any questions or concerns you may have - we are here for you!  Damien Cassis, MD [facility] [phone]

## 2024-03-17 ENCOUNTER — Ambulatory Visit: Admitting: Obstetrics and Gynecology

## 2024-03-17 ENCOUNTER — Other Ambulatory Visit (HOSPITAL_COMMUNITY)
Admission: RE | Admit: 2024-03-17 | Discharge: 2024-03-17 | Disposition: A | Discharge status: Home / Self Care | Source: Ambulatory Visit | Attending: Obstetrics and Gynecology | Admitting: Obstetrics and Gynecology

## 2024-03-17 ENCOUNTER — Encounter: Payer: Self-pay | Admitting: Obstetrics and Gynecology

## 2024-03-17 VITALS — BP 129/84 | HR 85 | Ht 64.0 in | Wt 276.0 lb

## 2024-03-17 DIAGNOSIS — Z01419 Encounter for gynecological examination (general) (routine) without abnormal findings: Secondary | ICD-10-CM | POA: Diagnosis present

## 2024-03-17 NOTE — Progress Notes (Signed)
 53 y.o. New GYN presents for AEX/STD screening.  HO Hysterectomy 04/2019.  Last Mammogram 09/14/23  Negative.

## 2024-03-17 NOTE — Progress Notes (Signed)
 Subjective:     Jodi Saunders is a 53 y.o. female P1 s/p hysterectomy in 2020 and BMI 47 who is here for a comprehensive physical exam. The patient reports no problems. She remarried 2 years ago. She is sexually active without complaints. She denies pelvic pain or abnormal discharge. She admits to urinary urgency with certain beverages but no incontinence. She denies issues with bowel movements  Past Medical History:  Diagnosis Date   Anemia    Aortic atherosclerosis 05/2021   Hypertension    Past Surgical History:  Procedure Laterality Date   ABDOMINAL HYSTERECTOMY     APPENDECTOMY     BREAST BIOPSY Left 08/2016   CHOLECYSTECTOMY  2011   CYSTOSCOPY Bilateral 05/13/2019   Procedure: CYSTOSCOPY;  Surgeon: Corene Coy, MD;  Location: [facility] [place];  Service: Gynecology;  Laterality: Bilateral;   HYSTEROSCOPY WITH NOVASURE  06/03/2012   Procedure: HYSTEROSCOPY WITH NOVASURE;  Surgeon: Winton Felt, MD;  Location: [facility];  Service: Gynecology;  Laterality: N/A;   LAPAROSCOPIC APPENDECTOMY N/A 12/14/2015   Procedure: APPENDECTOMY LAPAROSCOPIC CONVERTED TO OPEN;  Surgeon: Deward Null III, MD;  Location: [facility];  Service: General;  Laterality: N/A;   MULTIPLE TOOTH EXTRACTIONS     ORIF RADIUS & ULNA FRACTURES  2002   ROBOTIC ASSISTED TOTAL HYSTERECTOMY Bilateral 05/13/2019   Procedure: XI ROBOTIC ASSISTED TOTAL HYSTERECTOMY WITH SALPINGECTOMY, LYSIS OF ADHESION;  Surgeon: Corene Coy, MD;  Location: [facility];  Service: Gynecology;  Laterality: Bilateral;   TONSILLECTOMY  2008   VAGINAL DELIVERY  1992   WISDOM TOOTH EXTRACTION     Family History  Problem Relation Age of Onset   Hypertension Mother    Thalassemia Mother    Diabetes Father    Hypertension Father    Renal cancer Brother    Colon cancer Brother    Diabetes Paternal Grandmother    Hypertension Paternal Grandmother    Diabetes Paternal Grandfather     Hypertension Paternal Grandfather    Allergic rhinitis Neg Hx    Angioedema Neg Hx    Asthma Neg Hx    Atopy Neg Hx    Eczema Neg Hx    Immunodeficiency Neg Hx    Urticaria Neg Hx     Social History   Socioeconomic History   Marital status: Single    Spouse name: Not on file   Number of children: Not on file   Years of education: Not on file   Highest education level: Not on file  Occupational History   Not on file  Tobacco Use   Smoking status: Never   Smokeless tobacco: Never  Vaping Use   Vaping status: Never Used  Substance and Sexual Activity   Alcohol use: Not Currently   Drug use: No   Sexual activity: Yes    Birth control/protection: Surgical  Other Topics Concern   Not on file  Social History Narrative   Not on file   Social Drivers of Health   Financial Resource Strain: Not on file  Food Insecurity: Not on file  Transportation Needs: Not on file  Physical Activity: Not on file  Stress: Not on file  Social Connections: Not on file  Intimate Partner Violence: Not on file   Health Maintenance  Topic Date Due   Hepatitis B Vaccines 19-59 Average Risk (1 of 3 - 19+ 3-dose series) Never done   Pneumococcal Vaccine: 50+ Years (1 of 1 - PCV) Never done   Zoster Vaccines-  Shingrix  (1 of 2) Never done   Influenza Vaccine  01/18/2024   COVID-19 Vaccine (4 - 2025-26 season) 02/18/2024   Mammogram  09/13/2025   DTaP/Tdap/Td (3 - Td or Tdap) 09/23/2026   Colonoscopy  11/21/2028   Hepatitis C Screening  Completed   HIV Screening  Completed   HPV VACCINES  Aged Out   Meningococcal B Vaccine  Aged Out       Review of Systems Pertinent items noted in HPI and remainder of comprehensive ROS otherwise negative.   Objective:  Blood pressure 129/84, pulse 85, height 5' 4 (1.626 m), weight 276 lb (125.2 kg), last menstrual period 04/25/2019.   GENERAL: Well-developed, well-nourished female in no acute distress.  HEENT: Normocephalic, atraumatic. Sclerae  anicteric.  NECK: Supple. Normal thyroid.  LUNGS: Clear to auscultation bilaterally.  HEART: Regular rate and rhythm. BREASTS: Symmetric in size. No palpable masses or lymphadenopathy, skin changes, or nipple drainage. ABDOMEN: Soft, nontender, nondistended. No organomegaly. PELVIC: Normal external female genitalia with several inclusion cysts on left labia majora. Vagina is pink and rugated.  Normal discharge. Normal appearing vaginal vault. No adnexal mass or tenderness. Chaperone present during the pelvic exam EXTREMITIES: No cyanosis, clubbing, or edema, 2+ distal pulses.     Assessment:    Healthy female exam.      Plan:    Pap smear no longer indicated Patient current on mammogram and colonoscopy STI screening per patient request Patient will be contacted with abnormal results Follow up with PCP as scheduled See After Visit Summary for Counseling Recommendations

## 2024-03-18 LAB — CERVICOVAGINAL ANCILLARY ONLY
Chlamydia: NEGATIVE
Comment: NEGATIVE
Comment: NORMAL
Neisseria Gonorrhea: NEGATIVE

## 2024-03-18 LAB — HIV ANTIBODY (ROUTINE TESTING W REFLEX): HIV Screen 4th Generation wRfx: NONREACTIVE

## 2024-03-18 LAB — HEPATITIS C ANTIBODY: Hep C Virus Ab: NONREACTIVE

## 2024-03-18 LAB — HEPATITIS B SURFACE ANTIGEN: Hepatitis B Surface Ag: NEGATIVE

## 2024-03-18 LAB — RPR: RPR Ser Ql: NONREACTIVE

## 2024-05-28 ENCOUNTER — Encounter (INDEPENDENT_AMBULATORY_CARE_PROVIDER_SITE_OTHER): Payer: Self-pay | Admitting: Physician Assistant

## 2024-05-28 ENCOUNTER — Ambulatory Visit (INDEPENDENT_AMBULATORY_CARE_PROVIDER_SITE_OTHER): Admitting: Physician Assistant

## 2024-05-28 VITALS — BP 136/85 | HR 86

## 2024-05-28 DIAGNOSIS — H6992 Unspecified Eustachian tube disorder, left ear: Secondary | ICD-10-CM | POA: Diagnosis not present

## 2024-05-28 DIAGNOSIS — J309 Allergic rhinitis, unspecified: Secondary | ICD-10-CM

## 2024-05-29 NOTE — Progress Notes (Signed)
 Dear Dr. Delores, Here is my assessment for our mutual patient, Jodi Saunders. Thank you for allowing me the opportunity to care for your patient. Please do not hesitate to contact me should you have any other questions. Sincerely, Chyrl Cohen PA-C  Otolaryngology Clinic Note Referring provider: Dr. Delores HPI:  Jodi Saunders is a 53 y.o. female kindly referred by Dr. Delores   Discussed the use of AI scribe software for clinical note transcription with the patient, who gave verbal consent to proceed.  History of Present Illness    53 year old female seen in our office for follow-up evaluation eustachian tube dysfunction.  She was last seen in the office on 02/27/2024.  Below is recap of that encounter.  The patient is a 53 year old female seen in our office for evaluation of ear fullness.  The patient notes no significant history of ear infections, no decreased hearing, no trauma to the ears.  She notes that she had her first ear infection in February 2025 on the left side.  She notes since then she has been having intermittent feelings of fullness or clogging of the left ear.  She notes this is coming and going.  She denies any associated ringing or hearing loss, no dizziness, no associated pain.  She notes that her left ear  will pop when on the airplane, right will not.  She notes a history of seasonal allergies but does not take anything as it is not severe.   Update 05/29/2024   Since her last office visit she notes she has been asymptomatic until this morning.. Previously, she managed her symptoms effectively with saline irrigation twice daily but reduced the frequency to once nightly and eventually stopped in mid to late October as her symptoms had resolved. She has not used the azelastine  spray that was prescribed, as she did not find it necessary. Her symptoms were well-managed with the saline rinses alone until the recurrence this morning.  Over the weekend, she experienced severe shin cramps  that prevented walking and required her to lie down. During this time, her husband noted that she was snoring loudly, which he found unusual. She does not believe she has sleep apnea, as she is not aware of any breathing interruptions during sleep. No awareness of stopping breathing during sleep.           Independent Review of Additional Tests or Records:  None   PMH/Meds/All/SocHx/FamHx/ROS:   Past Medical History:  Diagnosis Date   Anemia    Aortic atherosclerosis 05/2021   Hypertension      Past Surgical History:  Procedure Laterality Date   ABDOMINAL HYSTERECTOMY     APPENDECTOMY     BREAST BIOPSY Left 08/2016   CHOLECYSTECTOMY  2011   CYSTOSCOPY Bilateral 05/13/2019   Procedure: CYSTOSCOPY;  Surgeon: Corene Coy, MD;  Location: [facility];  Service: Gynecology;  Laterality: Bilateral;   HYSTEROSCOPY WITH NOVASURE  06/03/2012   Procedure: HYSTEROSCOPY WITH NOVASURE;  Surgeon: Winton Felt, MD;  Location: [facility];  Service: Gynecology;  Laterality: N/A;   LAPAROSCOPIC APPENDECTOMY N/A 12/14/2015   Procedure: APPENDECTOMY LAPAROSCOPIC CONVERTED TO OPEN;  Surgeon: Deward Null III, MD;  Location: [facility];  Service: General;  Laterality: N/A;   MULTIPLE TOOTH EXTRACTIONS     ORIF RADIUS & ULNA FRACTURES  2002   ROBOTIC ASSISTED TOTAL HYSTERECTOMY Bilateral 05/13/2019   Procedure: XI ROBOTIC ASSISTED TOTAL HYSTERECTOMY WITH SALPINGECTOMY, LYSIS OF ADHESION;  Surgeon: Corene Coy, MD;  Location: [facility];  Service: Gynecology;  Laterality: Bilateral;   TONSILLECTOMY  2008   VAGINAL DELIVERY  1992   WISDOM TOOTH EXTRACTION      Family History  Problem Relation Age of Onset   Hypertension Mother    Thalassemia Mother    Diabetes Father    Hypertension Father    Renal cancer Brother    Colon cancer Brother    Diabetes Paternal Grandmother    Hypertension Paternal Grandmother    Diabetes Paternal Grandfather     Hypertension Paternal Grandfather    Allergic rhinitis Neg Hx    Angioedema Neg Hx    Asthma Neg Hx    Atopy Neg Hx    Eczema Neg Hx    Immunodeficiency Neg Hx    Urticaria Neg Hx      Social Connections: Not on file     Current Medications[1]   Physical Exam:   BP 136/85   Pulse 86   LMP 04/25/2019   SpO2 97%   Pertinent Findings  CN II-XII grossly intact Bilateral EAC clear and TM intact with well pneumatized middle ear spaces Anterior rhinoscopy: Septum midline; bilateral inferior turbinates with hypertrophy No lesions of oral cavity/oropharynx; dentition normal limits No obviously palpable neck masses/lymphadenopathy/thyromegaly No respiratory distress or stridor       Seprately Identifiable Procedures:  None  Impression & Plans:  Jodi Saunders is a 53 y.o. female with the following   Assessment and Plan    Eustachian tube dysfunction Intermittent ear congestion likely due to eustachian tube dysfunction likely secondary to allergic rhinitis - Resume saline irrigation regularly. - Use azelastine  spray if symptoms persist or worsen.  Allergic rhinitis Nasal congestion improved with saline irrigation. Nasal examination shows less swelling compared to previous visit. - Continue saline irrigation as needed. - Use azelastine  spray if nasal congestion worsens.           - f/u PRN   Thank you for allowing me the opportunity to care for your patient. Please do not hesitate to contact me should you have any other questions.  Sincerely, Chyrl Cohen PA-C [facility] Phone: [phone] Fax: [phone]  05/29/2024, 8:39 AM        [1]  Current Outpatient Medications:    azelastine  (ASTELIN ) 0.1 % nasal spray, Place 2 sprays into both nostrils 2 (two) times daily. Use in each nostril as directed, Disp: 30 mL, Rfl: 12   Biotin 1000 MCG tablet, Take 1,000 mcg by mouth daily., Disp: , Rfl:    cetirizine  (ZYRTEC ) 10 MG tablet, Take 10 mg by  mouth daily as needed for allergies., Disp: , Rfl:    cetirizine  (ZYRTEC ) 10 MG tablet, Take 1 tablet (10 mg total) by mouth daily., Disp: 30 tablet, Rfl: 11   cholecalciferol (VITAMIN D ) 1000 units tablet, Take 1 tablet (1,000 Units total) by mouth daily., Disp: 30 tablet, Rfl: 3   EPINEPHrine  0.3 mg/0.3 mL IJ SOAJ injection, Inject 0.3 mg into the muscle as needed for anaphylaxis., Disp: 2 each, Rfl: 1   fluticasone  (FLONASE ) 50 MCG/ACT nasal spray, Place 2 sprays into both nostrils daily., Disp: 16 g, Rfl: 6   methocarbamol  (ROBAXIN ) 500 MG tablet, Take 1 tablet (500 mg total) by mouth every 8 (eight) hours as needed for muscle spasms., Disp: 30 tablet, Rfl: 0   Omega-3 1000 MG CAPS, Take 1,000 mg by mouth daily. , Disp: , Rfl:    valsartan -hydrochlorothiazide  (DIOVAN  HCT) 160-25 MG tablet, Take 1 tablet by mouth daily., Disp: 90  tablet, Rfl: 3
